# Patient Record
Sex: Female | Born: 1980 | Race: White | Hispanic: No | Marital: Single | Smoking: Current every day smoker
Health system: Southern US, Community
[De-identification: ages and names within clinical notes are randomized; demographics above are authoritative.]

## PROBLEM LIST (undated history)

## (undated) DIAGNOSIS — K5790 Diverticulosis of intestine, part unspecified, without perforation or abscess without bleeding: Secondary | ICD-10-CM

## (undated) DIAGNOSIS — R112 Nausea with vomiting, unspecified: Secondary | ICD-10-CM

## (undated) DIAGNOSIS — F17200 Nicotine dependence, unspecified, uncomplicated: Secondary | ICD-10-CM

## (undated) DIAGNOSIS — I639 Cerebral infarction, unspecified: Secondary | ICD-10-CM

## (undated) DIAGNOSIS — G43909 Migraine, unspecified, not intractable, without status migrainosus: Secondary | ICD-10-CM

## (undated) DIAGNOSIS — Z7902 Long term (current) use of antithrombotics/antiplatelets: Secondary | ICD-10-CM

## (undated) DIAGNOSIS — G9341 Metabolic encephalopathy: Secondary | ICD-10-CM

## (undated) DIAGNOSIS — I10 Essential (primary) hypertension: Secondary | ICD-10-CM

## (undated) DIAGNOSIS — E785 Hyperlipidemia, unspecified: Secondary | ICD-10-CM

## (undated) DIAGNOSIS — F418 Other specified anxiety disorders: Secondary | ICD-10-CM

## (undated) DIAGNOSIS — Z9889 Other specified postprocedural states: Secondary | ICD-10-CM

## (undated) DIAGNOSIS — Z7901 Long term (current) use of anticoagulants: Secondary | ICD-10-CM

## (undated) DIAGNOSIS — D75838 Other thrombocytosis: Secondary | ICD-10-CM

## (undated) DIAGNOSIS — N812 Incomplete uterovaginal prolapse: Secondary | ICD-10-CM

## (undated) DIAGNOSIS — N83201 Unspecified ovarian cyst, right side: Secondary | ICD-10-CM

## (undated) DIAGNOSIS — R296 Repeated falls: Secondary | ICD-10-CM

## (undated) DIAGNOSIS — F4325 Adjustment disorder with mixed disturbance of emotions and conduct: Secondary | ICD-10-CM

## (undated) DIAGNOSIS — I480 Paroxysmal atrial fibrillation: Secondary | ICD-10-CM

## (undated) DIAGNOSIS — N939 Abnormal uterine and vaginal bleeding, unspecified: Secondary | ICD-10-CM

## (undated) DIAGNOSIS — N200 Calculus of kidney: Secondary | ICD-10-CM

## (undated) DIAGNOSIS — D509 Iron deficiency anemia, unspecified: Secondary | ICD-10-CM

## (undated) DIAGNOSIS — F329 Major depressive disorder, single episode, unspecified: Secondary | ICD-10-CM

## (undated) DIAGNOSIS — R0609 Other forms of dyspnea: Secondary | ICD-10-CM

## (undated) DIAGNOSIS — F419 Anxiety disorder, unspecified: Secondary | ICD-10-CM

## (undated) HISTORY — DX: Cerebral infarction, unspecified: I63.9

## (undated) HISTORY — PX: CHOLECYSTECTOMY: SHX55

## (undated) HISTORY — PX: TUBAL LIGATION: SHX77

## (undated) HISTORY — DX: Essential (primary) hypertension: I10

---

## 1998-06-26 ENCOUNTER — Ambulatory Visit (HOSPITAL_COMMUNITY): Admission: RE | Admit: 1998-06-26 | Discharge: 1998-06-26 | Payer: Self-pay | Admitting: *Deleted

## 1998-07-28 ENCOUNTER — Ambulatory Visit (HOSPITAL_COMMUNITY): Admission: RE | Admit: 1998-07-28 | Discharge: 1998-07-28 | Payer: Self-pay | Admitting: Obstetrics & Gynecology

## 1998-11-11 ENCOUNTER — Inpatient Hospital Stay (HOSPITAL_COMMUNITY): Admission: AD | Admit: 1998-11-11 | Discharge: 1998-11-11 | Payer: Self-pay | Admitting: Obstetrics

## 1998-11-13 ENCOUNTER — Inpatient Hospital Stay (HOSPITAL_COMMUNITY): Admission: AD | Admit: 1998-11-13 | Discharge: 1998-11-13 | Payer: Self-pay | Admitting: Obstetrics & Gynecology

## 1998-11-13 ENCOUNTER — Encounter: Payer: Self-pay | Admitting: Obstetrics & Gynecology

## 1998-11-16 ENCOUNTER — Inpatient Hospital Stay (HOSPITAL_COMMUNITY): Admission: AD | Admit: 1998-11-16 | Discharge: 1998-11-17 | Payer: Self-pay | Admitting: Obstetrics

## 1999-02-03 ENCOUNTER — Emergency Department (HOSPITAL_COMMUNITY): Admission: EM | Admit: 1999-02-03 | Discharge: 1999-02-03 | Payer: Self-pay

## 1999-08-17 ENCOUNTER — Encounter: Payer: Self-pay | Admitting: Emergency Medicine

## 1999-08-17 ENCOUNTER — Emergency Department (HOSPITAL_COMMUNITY): Admission: EM | Admit: 1999-08-17 | Discharge: 1999-08-17 | Payer: Self-pay | Admitting: Emergency Medicine

## 2000-08-09 ENCOUNTER — Emergency Department (HOSPITAL_COMMUNITY): Admission: EM | Admit: 2000-08-09 | Discharge: 2000-08-09 | Payer: Self-pay | Admitting: Emergency Medicine

## 2000-08-09 ENCOUNTER — Encounter: Payer: Self-pay | Admitting: Emergency Medicine

## 2001-05-14 ENCOUNTER — Ambulatory Visit (HOSPITAL_COMMUNITY): Admission: RE | Admit: 2001-05-14 | Discharge: 2001-05-14 | Payer: Self-pay | Admitting: *Deleted

## 2001-08-29 ENCOUNTER — Ambulatory Visit (HOSPITAL_COMMUNITY): Admission: RE | Admit: 2001-08-29 | Discharge: 2001-08-29 | Payer: Self-pay | Admitting: *Deleted

## 2001-09-20 ENCOUNTER — Inpatient Hospital Stay (HOSPITAL_COMMUNITY): Admission: AD | Admit: 2001-09-20 | Discharge: 2001-09-20 | Payer: Self-pay | Admitting: *Deleted

## 2001-09-26 ENCOUNTER — Encounter (INDEPENDENT_AMBULATORY_CARE_PROVIDER_SITE_OTHER): Payer: Self-pay

## 2001-09-26 ENCOUNTER — Inpatient Hospital Stay (HOSPITAL_COMMUNITY): Admission: AD | Admit: 2001-09-26 | Discharge: 2001-09-27 | Payer: Self-pay | Admitting: *Deleted

## 2003-01-05 ENCOUNTER — Emergency Department (HOSPITAL_COMMUNITY): Admission: EM | Admit: 2003-01-05 | Discharge: 2003-01-05 | Payer: Self-pay | Admitting: Emergency Medicine

## 2003-10-24 ENCOUNTER — Emergency Department (HOSPITAL_COMMUNITY): Admission: EM | Admit: 2003-10-24 | Discharge: 2003-10-24 | Payer: Self-pay | Admitting: Emergency Medicine

## 2004-05-11 ENCOUNTER — Emergency Department (HOSPITAL_COMMUNITY): Admission: EM | Admit: 2004-05-11 | Discharge: 2004-05-12 | Payer: Self-pay | Admitting: Emergency Medicine

## 2004-05-22 ENCOUNTER — Emergency Department (HOSPITAL_COMMUNITY): Admission: EM | Admit: 2004-05-22 | Discharge: 2004-05-23 | Payer: Self-pay | Admitting: Emergency Medicine

## 2004-06-07 ENCOUNTER — Ambulatory Visit (HOSPITAL_COMMUNITY): Admission: RE | Admit: 2004-06-07 | Discharge: 2004-06-07 | Payer: Self-pay | Admitting: General Surgery

## 2004-06-07 ENCOUNTER — Encounter (INDEPENDENT_AMBULATORY_CARE_PROVIDER_SITE_OTHER): Payer: Self-pay | Admitting: *Deleted

## 2007-11-23 ENCOUNTER — Emergency Department (HOSPITAL_COMMUNITY): Admission: EM | Admit: 2007-11-23 | Discharge: 2007-11-23 | Payer: Self-pay | Admitting: Emergency Medicine

## 2010-08-06 NOTE — Op Note (Signed)
Crystal Benitez, Crystal Benitez              ACCOUNT NO.:  [id number]   MEDICAL RECORD NO.:  [id number]          PATIENT TYPE:  AMB   LOCATION:  DAY                          FACILITY:  [facility]   PHYSICIAN:  Angelia Mould. Derrell Lolling, M.D.DATE OF BIRTH:  30-Jul-1980   DATE OF PROCEDURE:  06/07/2004  DATE OF DISCHARGE:                                 OPERATIVE REPORT   PREOPERATIVE DIAGNOSIS:  Subacute and chronic cholecystitis with  cholelithiasis.   POSTOPERATIVE DIAGNOSIS:  Subacute and chronic cholecystitis with  cholelithiasis.   OPERATION PERFORMED:  Laparoscopic cholecystectomy with cholangiogram.   SURGEON:  Angelia Mould. Derrell Lolling, M.D.   FIRST ASSISTANT:  Vikki Ports, M.D.   OPERATIVE INDICATIONS:  This is a 30 year old Latino female, gravida 3, para  3, who has a several-week history of recurrent episodes of epigastric and  right upper quadrant pain. This has been getting worse lately.  She has gone  to the emergency department on February 21 and again on March 6.  On  February 21, she had a gallbladder ultrasound which showed gallstones but  was otherwise normal.  She has had laboratory work on both occasions which  was normal.  She was seen in the office this morning by Dr. Leonie Man  who felt that she possibly had acute cholecystitis and was in a lot of pain.  The patient was transferred to [facility] where I have interviewed  her, examined her, and counseled with her regarding cholecystectomy.  I  discussed the indications and details surgery with her. Risks and  complications were outlined, including but not limited to bleeding,  infection, conversion to open laparotomy, injury to adjacent structures such  as the main bile duct or vessels with major reconstructive surgery, wound  problems, cardiac, pulmonary and thromboembolic problems.  At this time, she  seems to understand all of these issues well.  All of her questions were  answered. She would like to go ahead  with cholecystectomy today because of  daily symptoms.   OPERATIVE FINDINGS:  The gallbladder appeared chronically inflamed, somewhat  discolored, but was basically thin-walled and did not look severely acutely  inflamed.  The anatomy of the cystic duct and cystic artery and common bile  duct were conventional.  The cholangiogram was normal showing normal caliber  bile ducts, normal intrahepatic and extrahepatic biliary anatomy, no filling  defects, and good flow of contrast into the duodenum. There was no other  abnormality noted of the liver, stomach, duodenum, small intestine, large  intestine, or peritoneal surfaces.   OPERATIVE TECHNIQUE:  Following the induction of general endotracheal  anesthesia, the patient's abdomen was prepped and draped in a sterile  fashion.  Then, 0.5% Marcaine with epinephrine was used s a local  infiltration anesthetic. A vertically oriented incision was made inside the  lower rim of the umbilicus. The fascia was incised in the midline and the  abdominal cavity entered under direct vision.  A 10-mm Hassan trocar was  inserted and secured with a pursestring suture of 0 Vicryl.  Pneumoperitoneum was created. The cannula was inserted with visualization  and findings  as described above.  A 10-mm trocar was placed in the  subxiphoid region and two 5-mm trocars placed in the right midabdomen.  The  gallbladder fundus was elevated.  The infundibulum was identified and  retracted laterally.  I dissected the peritoneum off the neck of the  gallbladder, and the cystic duct and the cystic artery were identified.  I  identified an anterior branch and a posterior branch of the cystic artery,  isolated these separately, secured them separately with metal clips, and  divided them.  This created a large window and a nice critical view of the  cystic duct.  The cystic duct was secured with a metal clip close to the  gallbladder.  A cholangiogram catheter was inserted into  the cystic duct.  Cholangiogram was obtained using the C-arm. This showed normal biliary  anatomy, no filling defect, and no obstruction.  The cholangiogram catheter  was removed.  The cystic duct was secured with multiple metal clips and  divided.  The gallbladder was dissected from its bed with electrocautery and  removed through the umbilical port.  The operative field was copiously  irrigated with saline both under and above the liver.  At the completion of  the case, the irrigation fluid was completely clear, and there was no  bleeding or bile leak whatsoever.  The trocars were removed under direct  vision.  There was no bleeding from the trocar sites. Pneumoperitoneum was  released.  The fascia at the umbilicus was closed with 0 Vicryl sutures.  The skin incisions were closed with subcuticular sutures of 4-0 Vicryl and  Steri-Strips.  Clean bandages were placed, and the patient taken to the  recovery room in stable condition.  Estimated blood loss was about 10 cc.  Complications:  None.  Sponge, needle, and instrument counts were correct.      HMI/MEDQ  D:  06/07/2004  T:  06/07/2004  Job:  [id number]

## 2010-08-06 NOTE — Op Note (Signed)
[facility]  Patient:    Crystal Benitez, Crystal Benitez Visit Number: [id number] MRN: [id number]          Service Type: OBS Location: 910A [id number] [id number] Attending Physician:  Michaelle Copas Dictated by:   Elinor Dodge, M.D. Proc. Date: 09/26/01 Admit Date:  09/26/2001 Discharge Date: 09/27/2001                             Operative Report  PROCEDURE:                    Bilateral tubal ligation postpartum,                               modified P[place]oy.  PREOPERATIVE DIAGNOSIS:       Voluntary sterilization.  POSTOPERATIVE DIAGNOSIS:      Voluntary sterilization.  DESCRIPTION OF PROCEDURE:     Under satisfactory epidural anesthesia, with the patient in the dorsal supine position, a 4 cm incision is made elliptical under the umbilicus.  The abdomen was entered by layers and into the peritoneal cavity.  The right tube was grasped in the mid point and followed down to the fimbria.  An opening made in the meso beneath the tube in the mid portion with a hemostat and one plain catgut suture pulled through.  This was tied around the distal and proximal portions of the tube, performing a loop above the tie of approximately 2 cm.  A second tie placed just below the aforementioned tie of the same material, this was cut short.  The portion of the tube above the ties were excised and the remaining ends of the tube at that point were coagulated with high cauteries.  The process was carried out then on the left fallopian tube.  The area was observed for bleeding; none was noted.  The peritoneum and fascia were closed with continuous running 0 Vicryl and an atraumatic needle up to the subcutaneous tissue.  This was also reapproximated with the same suture.  It was cut short and the skin edges approximated with Dermabond material.  A dry sterile dressing was applied.  The patient tolerated the procedure well and was transferred to the recovery room in satisfactory  condition with minimal blood loss. Dictated by:   Elinor Dodge, M.D. Attending Physician:  Michaelle Copas DD:  09/26/01 TD:  09/30/01 Job: [id number]

## 2011-04-28 ENCOUNTER — Emergency Department (HOSPITAL_COMMUNITY)
Admission: EM | Admit: 2011-04-28 | Discharge: 2011-04-28 | Disposition: A | Discharge status: Home / Self Care | Payer: Medicaid Other | Attending: Emergency Medicine | Admitting: Emergency Medicine

## 2011-04-28 ENCOUNTER — Encounter (HOSPITAL_COMMUNITY): Payer: Self-pay | Admitting: Emergency Medicine

## 2011-04-28 DIAGNOSIS — J069 Acute upper respiratory infection, unspecified: Secondary | ICD-10-CM

## 2011-04-28 DIAGNOSIS — R05 Cough: Secondary | ICD-10-CM | POA: Insufficient documentation

## 2011-04-28 DIAGNOSIS — R059 Cough, unspecified: Secondary | ICD-10-CM | POA: Insufficient documentation

## 2011-04-28 NOTE — ED Provider Notes (Signed)
Medical screening examination/treatment/procedure(s) were performed by non-physician practitioner and as supervising physician I was immediately available for consultation/collaboration.  Flint Melter, MD 04/28/11 [phone]

## 2011-04-28 NOTE — ED Provider Notes (Signed)
History     CSN: [id number]  Arrival date & time 04/28/11  0900   First MD Initiated Contact with Patient 04/28/11 0920      No chief complaint on file.   (Consider location/radiation/quality/duration/timing/severity/associated sxs/prior treatment) Patient is a 31 y.o. female presenting with pharyngitis. The history is provided by the patient.  Sore Throat This is a new problem. Episode onset: 2 days ago. The problem occurs constantly. The problem has been unchanged. Associated symptoms include chills, congestion, coughing and a sore throat. Pertinent negatives include no abdominal pain, chest pain, fever, headaches, myalgias, nausea, neck pain, numbness, rash, vertigo, visual change or vomiting. Associated symptoms comments: Positive bilateral ear pain. The symptoms are aggravated by swallowing. Treatments tried: bneadryl, OTC cold medication. The treatment provided mild relief.    No past medical history on file.  Past Surgical History  Procedure Date  . Cholecystectomy   . Tubal ligation     No family history on file.  History  Substance Use Topics  . Smoking status: Never Smoker   . Smokeless tobacco: Never Used  . Alcohol Use: No     Review of Systems  Constitutional: Positive for chills. Negative for fever.  HENT: Positive for ear pain, congestion, sore throat, rhinorrhea and sneezing. Negative for hearing loss, neck pain, neck stiffness, dental problem, voice change and tinnitus.   Eyes: Negative for pain and visual disturbance.  Respiratory: Positive for cough. Negative for chest tightness, shortness of breath and wheezing.   Cardiovascular: Negative for chest pain.  Gastrointestinal: Negative for nausea, vomiting and abdominal pain. Diarrhea: loose stools yesterday, none today.  Musculoskeletal: Negative for myalgias, back pain and gait problem.  Skin: Negative for rash.  Neurological: Negative for dizziness, vertigo, numbness and headaches.    Psychiatric/Behavioral: Negative for confusion.    Allergies  Review of patient's allergies indicates no known allergies.  Home Medications   Current Outpatient Rx  Name Route Sig Dispense Refill  . DIPHENHYDRAMINE HCL 25 MG PO CAPS Oral Take 25 mg by mouth every 6 (six) hours as needed. For sneezing    . NYQUIL COLD & FLU PO Oral Take 2 capsules by mouth every 6 (six) hours as needed. For cold symptoms      BP 141/80  Pulse 95  Temp(Src) 98.1 F (36.7 C) (Oral)  Resp 20  SpO2 100%  LMP 04/07/2011  Physical Exam  Nursing note and vitals reviewed. Constitutional: She is oriented to person, place, and time. She appears well-developed and well-nourished. No distress.  HENT:  Head: Normocephalic and atraumatic. No trismus in the jaw.  Right Ear: Hearing, external ear and ear canal normal.  Left Ear: Hearing, external ear and ear canal normal.  Nose: Rhinorrhea present. Right sinus exhibits no maxillary sinus tenderness and no frontal sinus tenderness. Left sinus exhibits no maxillary sinus tenderness and no frontal sinus tenderness.  Mouth/Throat: Uvula is midline and mucous membranes are normal. Posterior oropharyngeal erythema present. No oropharyngeal exudate or posterior oropharyngeal edema.       Mild bilateral TM injection  Eyes: Conjunctivae are normal. Pupils are equal, round, and reactive to light. Right eye exhibits no discharge. Left eye exhibits no discharge.  Neck: Normal range of motion. Neck supple.  Cardiovascular: Normal rate, regular rhythm and normal heart sounds.   No murmur heard. Pulmonary/Chest: Effort normal and breath sounds normal. No respiratory distress. She has no wheezes. She exhibits no tenderness.  Abdominal: Soft. She exhibits no distension. There is no tenderness.  Musculoskeletal:  She exhibits no edema and no tenderness.  Lymphadenopathy:    She has no cervical adenopathy.  Neurological: She is alert and oriented to person, place, and time. No  cranial nerve deficit.  Skin: Skin is warm and dry. No rash noted.  Psychiatric: She has a normal mood and affect.    ED Course  Procedures (including critical care time)    Dx 1: URI   MDM  Viral s/s. No myalgias or fever to suggest influenza. No SOB, hypoxia, abn breath sounds to suggest pneumonia. Discussed symptomatic tx with pt.        Shaaron Adler, [place] 04/28/11 1007

## 2011-04-28 NOTE — ED Notes (Signed)
Pt c/o of sore throat since x 2 days that hurts to swallow. Pt unsure if she has had a fever but has a cough and runny nose. Pt reports some diarrhea 4 episodes yesterday but denies n/v.

## 2012-09-27 ENCOUNTER — Emergency Department (HOSPITAL_COMMUNITY)
Admission: EM | Admit: 2012-09-27 | Discharge: 2012-09-27 | Disposition: A | Discharge status: Home / Self Care | Payer: BC Managed Care – PPO | Attending: Emergency Medicine | Admitting: Emergency Medicine

## 2012-09-27 ENCOUNTER — Encounter (HOSPITAL_COMMUNITY): Payer: Self-pay | Admitting: Emergency Medicine

## 2012-09-27 ENCOUNTER — Emergency Department (HOSPITAL_COMMUNITY): Payer: BC Managed Care – PPO

## 2012-09-27 DIAGNOSIS — R0602 Shortness of breath: Secondary | ICD-10-CM | POA: Insufficient documentation

## 2012-09-27 DIAGNOSIS — Z79899 Other long term (current) drug therapy: Secondary | ICD-10-CM | POA: Insufficient documentation

## 2012-09-27 DIAGNOSIS — J3489 Other specified disorders of nose and nasal sinuses: Secondary | ICD-10-CM | POA: Insufficient documentation

## 2012-09-27 DIAGNOSIS — J40 Bronchitis, not specified as acute or chronic: Secondary | ICD-10-CM | POA: Insufficient documentation

## 2012-09-27 MED ORDER — AZITHROMYCIN 250 MG PO TABS: ORAL_TABLET | ORAL | Status: DC

## 2012-09-27 MED ORDER — ALBUTEROL SULFATE (5 MG/ML) 0.5% IN NEBU
2.5000 mg | INHALATION_SOLUTION | Freq: Once | RESPIRATORY_TRACT | Status: AC
  Administered 2012-09-27: 2.5 mg via RESPIRATORY_TRACT
  Filled 2012-09-27: qty 0.5

## 2012-09-27 MED ORDER — ALBUTEROL SULFATE HFA 108 (90 BASE) MCG/ACT IN AERS: 2.0000 | INHALATION_SPRAY | RESPIRATORY_TRACT | Status: DC | PRN

## 2012-09-27 MED ORDER — BENZONATATE 100 MG PO CAPS: 100.0000 mg | ORAL_CAPSULE | Freq: Three times a day (TID) | ORAL | Status: DC

## 2012-09-27 NOTE — ED Notes (Signed)
Pt states she has been coughing x 1 week. Pt states she has some SOB and is coughing up mucous. Pt in NAD. VSS.

## 2012-09-27 NOTE — ED Provider Notes (Signed)
History    CSN: [id number] Arrival date & time 09/27/12  0620  First MD Initiated Contact with Patient 09/27/12 0701     Chief Complaint  Patient presents with  . Cough   (Consider location/radiation/quality/duration/timing/severity/associated sxs/prior Treatment) HPI Comments: Patient comes to the ER for evaluation of cough. Patient reports that she started having cough, nasal congestion approximately one week ago. Cough has been worse, now burning up mucus. She feels short of breath. She has been taking Claritin-D with some improvement. She has not had any fever.  Patient is a 32 y.o. female presenting with cough.  Cough Associated symptoms: shortness of breath   Associated symptoms: no fever    History reviewed. No pertinent past medical history. Past Surgical History  Procedure Laterality Date  . Cholecystectomy    . Tubal ligation     No family history on file. History  Substance Use Topics  . Smoking status: Never Smoker   . Smokeless tobacco: Never Used  . Alcohol Use: No   OB History   Grav Para Term Preterm Abortions TAB SAB Ect Mult Living                 Review of Systems  Constitutional: Negative for fever.  HENT: Positive for congestion.   Respiratory: Positive for cough and shortness of breath.   All other systems reviewed and are negative.    Allergies  Review of patient's allergies indicates no known allergies.  Home Medications   Current Outpatient Rx  Name  Route  Sig  Dispense  Refill  . dextromethorphan (DELSYM) 30 MG/5ML liquid   Oral   Take 60 mg by mouth 2 (two) times daily as needed for cough.         Marland Kitchen guaiFENesin (ROBITUSSIN) 100 MG/5ML SOLN   Oral   Take 5 mLs by mouth every 4 (four) hours as needed. For cough         . loratadine-pseudoephedrine (CLARITIN-D 24-HOUR) 10-240 MG per 24 hr tablet   Oral   Take 1 tablet by mouth daily.          BP 102/80  Pulse 89  Temp(Src) 97.4 F (36.3 C) (Oral)  Resp 16  SpO2 97%   LMP 09/07/2012 Physical Exam  Constitutional: She is oriented to person, place, and time. She appears well-developed and well-nourished. No distress.  HENT:  Head: Normocephalic and atraumatic.  Right Ear: Hearing normal.  Left Ear: Hearing normal.  Nose: Nose normal.  Mouth/Throat: Oropharynx is clear and moist and mucous membranes are normal.  Eyes: Conjunctivae and EOM are normal. Pupils are equal, round, and reactive to light.  Neck: Normal range of motion. Neck supple.  Cardiovascular: Regular rhythm, S1 normal and S2 normal.  Exam reveals no gallop and no friction rub.   No murmur heard. Pulmonary/Chest: Effort normal and breath sounds normal. No respiratory distress. She exhibits no tenderness.  Abdominal: Soft. Normal appearance and bowel sounds are normal. There is no hepatosplenomegaly. There is no tenderness. There is no rebound, no guarding, no tenderness at McBurney's point and negative Murphy's sign. No hernia.  Musculoskeletal: Normal range of motion.  Neurological: She is alert and oriented to person, place, and time. She has normal strength. No cranial nerve deficit or sensory deficit. Coordination normal. GCS eye subscore is 4. GCS verbal subscore is 5. GCS motor subscore is 6.  Skin: Skin is warm, dry and intact. No rash noted. No cyanosis.  Psychiatric: She has a normal mood and  affect. Her speech is normal and behavior is normal. Thought content normal.    ED Course  Procedures (including critical care time) Labs Reviewed - No data to display Dg Chest 2 View  09/27/2012   *RADIOLOGY REPORT*  Clinical Data: Cough and congestion. Shortness of breath.  Prior smoker.  CHEST - 2 VIEW  Comparison: 11/23/2007 peri  Findings: Minimal peribronchial thickening and slight increased lung markings appear chronic may be related to chronic bronchitis type changes.  No infiltrate, congestive heart failure or pneumothorax.  Heart size within normal limits.  Lateral view with possible  subsegmental atelectatic changes retrosternal region.  Stability and/or clearing can be confirmed on follow-up.  IMPRESSION: Minimal peribronchial thickening and slight increased lung markings appear chronic may be related to chronic bronchitis type changes. No segmental infiltrate.  Lateral view with possible subsegmental atelectatic changes retrosternal region.  Stability and/or clearing can be confirmed on follow-up.   Original Report Authenticated By: Lacy Duverney, M.D.    Diagnosis: Bronchitis  MDM  Patient presents to the ER for evaluation of one week of progressively worsening cough. Patient has not been improving with over-the-counter medications. Chest x-ray does not show any evidence of pneumonia. Presentation consistent with bronchitis. Breath sounds are slightly diminished bilaterally without active wheezing. Symptoms and aeration improved with bronchodilator.  Gilda Crease, MD 09/27/12 [phone]

## 2013-01-17 ENCOUNTER — Emergency Department (HOSPITAL_COMMUNITY): Payer: BC Managed Care – PPO

## 2013-01-17 ENCOUNTER — Encounter (HOSPITAL_COMMUNITY): Payer: Self-pay | Admitting: Emergency Medicine

## 2013-01-17 ENCOUNTER — Emergency Department (HOSPITAL_COMMUNITY)
Admission: EM | Admit: 2013-01-17 | Discharge: 2013-01-17 | Disposition: A | Discharge status: Home / Self Care | Payer: BC Managed Care – PPO | Attending: Emergency Medicine | Admitting: Emergency Medicine

## 2013-01-17 DIAGNOSIS — J159 Unspecified bacterial pneumonia: Secondary | ICD-10-CM | POA: Insufficient documentation

## 2013-01-17 DIAGNOSIS — Z79899 Other long term (current) drug therapy: Secondary | ICD-10-CM | POA: Insufficient documentation

## 2013-01-17 DIAGNOSIS — R52 Pain, unspecified: Secondary | ICD-10-CM | POA: Insufficient documentation

## 2013-01-17 DIAGNOSIS — R062 Wheezing: Secondary | ICD-10-CM | POA: Insufficient documentation

## 2013-01-17 DIAGNOSIS — J189 Pneumonia, unspecified organism: Secondary | ICD-10-CM

## 2013-01-17 MED ORDER — ALBUTEROL SULFATE HFA 108 (90 BASE) MCG/ACT IN AERS
2.0000 | INHALATION_SPRAY | Freq: Four times a day (QID) | RESPIRATORY_TRACT | Status: DC
  Administered 2013-01-17: 2 via RESPIRATORY_TRACT
  Filled 2013-01-17: qty 6.7

## 2013-01-17 MED ORDER — IPRATROPIUM BROMIDE 0.02 % IN SOLN
0.5000 mg | Freq: Once | RESPIRATORY_TRACT | Status: AC
  Administered 2013-01-17: 0.5 mg via RESPIRATORY_TRACT
  Filled 2013-01-17: qty 2.5

## 2013-01-17 MED ORDER — ALBUTEROL SULFATE (5 MG/ML) 0.5% IN NEBU
5.0000 mg | INHALATION_SOLUTION | Freq: Once | RESPIRATORY_TRACT | Status: AC
  Administered 2013-01-17: 5 mg via RESPIRATORY_TRACT
  Filled 2013-01-17: qty 1

## 2013-01-17 MED ORDER — AZITHROMYCIN 250 MG PO TABS: 250.0000 mg | ORAL_TABLET | Freq: Every day | ORAL | Status: DC

## 2013-01-17 NOTE — ED Notes (Signed)
Pt states since Saturday has had cough, congestion w/ green mucus, body aches, chills and nausea.

## 2013-01-17 NOTE — ED Provider Notes (Signed)
CSN: [id number]     Arrival date & time 01/17/13  4098 History   First MD Initiated Contact with Patient 01/17/13 [phone]     Chief Complaint  Patient presents with  . Cough  . Chills  . Generalized Body Aches  . Nasal Congestion   (Consider location/radiation/quality/duration/timing/severity/associated sxs/prior Treatment) HPI Comments: Patient presents to the emergency department with chief complaint of cough, sore throat, and chest congestion. She states that she has been sick since Saturday. She states that her other family members have been diagnosed with pneumonia. She endorses productive green sputum. She also states that she has had subjective fevers and chills, but has not taken her temperature. She is a former smoker, and reportedly quit 3 months ago. Nothing makes her symptoms better or worse. No other health problems.  The history is provided by the patient. No language interpreter was used.    History reviewed. No pertinent past medical history. Past Surgical History  Procedure Laterality Date  . Cholecystectomy    . Tubal ligation     No family history on file. History  Substance Use Topics  . Smoking status: Never Smoker   . Smokeless tobacco: Never Used  . Alcohol Use: No   OB History   Grav Para Term Preterm Abortions TAB SAB Ect Mult Living                 Review of Systems  All other systems reviewed and are negative.    Allergies  Review of patient's allergies indicates no known allergies.  Home Medications   Current Outpatient Rx  Name  Route  Sig  Dispense  Refill  . albuterol (PROVENTIL HFA;VENTOLIN HFA) 108 (90 BASE) MCG/ACT inhaler   Inhalation   Inhale 2 puffs into the lungs every 4 (four) hours as needed for wheezing.   1 Inhaler   0   . benzonatate (TESSALON) 100 MG capsule   Oral   Take 1 capsule (100 mg total) by mouth every 8 (eight) hours.   21 capsule   0   . dextromethorphan (DELSYM) 30 MG/5ML liquid   Oral   Take 60 mg by  mouth 2 (two) times daily as needed for cough.         Marland Kitchen guaiFENesin (ROBITUSSIN) 100 MG/5ML SOLN   Oral   Take 5 mLs by mouth every 4 (four) hours as needed. For cough         . loratadine-pseudoephedrine (CLARITIN-D 24-HOUR) 10-240 MG per 24 hr tablet   Oral   Take 1 tablet by mouth daily.          BP 137/86  Pulse 89  Temp(Src) 98.1 F (36.7 C) (Oral)  Resp 20  SpO2 96%  LMP 01/17/2013 Physical Exam  Nursing note and vitals reviewed. Constitutional: She is oriented to person, place, and time. She appears well-developed and well-nourished.  HENT:  Head: Normocephalic and atraumatic.  Eyes: Conjunctivae and EOM are normal. Pupils are equal, round, and reactive to light.  Neck: Normal range of motion. Neck supple.  Cardiovascular: Normal rate and regular rhythm.  Exam reveals no gallop and no friction rub.   No murmur heard. Pulmonary/Chest: Effort normal. No respiratory distress. She has wheezes. She has no rales. She exhibits no tenderness.  Lower lobe wheezes  Abdominal: Soft. Bowel sounds are normal. She exhibits no distension and no mass. There is no tenderness. There is no rebound and no guarding.  Musculoskeletal: Normal range of motion. She exhibits no edema and  no tenderness.  Neurological: She is alert and oriented to person, place, and time.  Skin: Skin is warm and dry.  Psychiatric: She has a normal mood and affect. Her behavior is normal. Judgment and thought content normal.    ED Course  Procedures (including critical care time) No results found for this or any previous visit. Dg Chest 2 View  01/17/2013   CLINICAL DATA:  Chest pain, cough and congestion.  EXAM: CHEST  2 VIEW  COMPARISON:  09/27/2012.  FINDINGS: The cardiac silhouette, mediastinal and hilar contours are within normal limits and stable. There is peribronchial thickening and increased interstitial markings consistent with bronchitis. There is also ill-defined opacity in the lingular region  consistent with pneumonia.  IMPRESSION: Bronchitis and left lingular pneumonia.   Electronically Signed   By: Loralie Champagne M.D.   On: 01/17/2013 09:50      EKG Interpretation   None       MDM   1. Community acquired pneumonia     Patient with productive cough and chest congestion. Wheezes heard on exam. Will give nebulizer and chest x-ray. Will reevaluate.    CXR reveals pneumonia.  O2 sat is 97.  No in any apparent distress.  Discharge home with inhaler and azithromycin.    Roxy Horseman, PA-C 01/17/13 1023

## 2013-01-17 NOTE — ED Notes (Signed)
RT called

## 2013-01-17 NOTE — ED Provider Notes (Signed)
Medical screening examination/treatment/procedure(s) were performed by non-physician practitioner and as supervising physician I was immediately available for consultation/collaboration.  EKG Interpretation   None         Gwyneth Sprout, MD 01/17/13 1516

## 2013-01-17 NOTE — ED Notes (Addendum)
Pt, states husband and daughter both have pneumonia

## 2014-02-17 ENCOUNTER — Emergency Department (HOSPITAL_COMMUNITY)
Admission: EM | Admit: 2014-02-17 | Discharge: 2014-02-17 | Disposition: A | Discharge status: Home / Self Care | Payer: 59 | Attending: Emergency Medicine | Admitting: Emergency Medicine

## 2014-02-17 ENCOUNTER — Encounter (HOSPITAL_COMMUNITY): Payer: Self-pay | Admitting: Emergency Medicine

## 2014-02-17 ENCOUNTER — Emergency Department (HOSPITAL_COMMUNITY): Payer: 59

## 2014-02-17 DIAGNOSIS — Z9049 Acquired absence of other specified parts of digestive tract: Secondary | ICD-10-CM | POA: Diagnosis not present

## 2014-02-17 DIAGNOSIS — Z9851 Tubal ligation status: Secondary | ICD-10-CM | POA: Diagnosis not present

## 2014-02-17 DIAGNOSIS — R1013 Epigastric pain: Secondary | ICD-10-CM | POA: Insufficient documentation

## 2014-02-17 DIAGNOSIS — R109 Unspecified abdominal pain: Secondary | ICD-10-CM | POA: Diagnosis present

## 2014-02-17 DIAGNOSIS — R110 Nausea: Secondary | ICD-10-CM

## 2014-02-17 DIAGNOSIS — R10816 Epigastric abdominal tenderness: Secondary | ICD-10-CM

## 2014-02-17 DIAGNOSIS — Z3202 Encounter for pregnancy test, result negative: Secondary | ICD-10-CM | POA: Insufficient documentation

## 2014-02-17 DIAGNOSIS — Z87442 Personal history of urinary calculi: Secondary | ICD-10-CM | POA: Diagnosis not present

## 2014-02-17 DIAGNOSIS — Z79899 Other long term (current) drug therapy: Secondary | ICD-10-CM | POA: Diagnosis not present

## 2014-02-17 HISTORY — DX: Calculus of kidney: N20.0

## 2014-02-17 LAB — CBC WITH DIFFERENTIAL/PLATELET
Basophils Absolute: 0 K/uL (ref 0.0–0.1)
Basophils Relative: 0 % (ref 0–1)
Eosinophils Absolute: 0.1 K/uL (ref 0.0–0.7)
Eosinophils Relative: 1 % (ref 0–5)
HCT: 40.6 % (ref 36.0–46.0)
Hemoglobin: 13.8 g/dL (ref 12.0–15.0)
Lymphocytes Relative: 34 % (ref 12–46)
Lymphs Abs: 2.5 K/uL (ref 0.7–4.0)
MCH: 29.9 pg (ref 26.0–34.0)
MCHC: 34 g/dL (ref 30.0–36.0)
MCV: 88.1 fL (ref 78.0–100.0)
Monocytes Absolute: 0.4 K/uL (ref 0.1–1.0)
Monocytes Relative: 5 % (ref 3–12)
Neutro Abs: 4.4 K/uL (ref 1.7–7.7)
Neutrophils Relative %: 60 % (ref 43–77)
Platelets: ADEQUATE K/uL (ref 150–400)
RBC: 4.61 MIL/uL (ref 3.87–5.11)
RDW: 13.8 % (ref 11.5–15.5)
WBC: 7.4 K/uL (ref 4.0–10.5)

## 2014-02-17 LAB — URINALYSIS, ROUTINE W REFLEX MICROSCOPIC
Bilirubin Urine: NEGATIVE
Glucose, UA: NEGATIVE mg/dL
Ketones, ur: NEGATIVE mg/dL
Leukocytes, UA: NEGATIVE
Nitrite: NEGATIVE
Protein, ur: NEGATIVE mg/dL
Specific Gravity, Urine: 1.005 (ref 1.005–1.030)
Urobilinogen, UA: 0.2 mg/dL (ref 0.0–1.0)
pH: 5.5 (ref 5.0–8.0)

## 2014-02-17 LAB — COMPREHENSIVE METABOLIC PANEL
ALBUMIN: 4 g/dL (ref 3.5–5.2)
ALK PHOS: 66 U/L (ref 39–117)
ALT: 27 U/L (ref 0–35)
ANION GAP: 16 — AB (ref 5–15)
AST: 30 U/L (ref 0–37)
BUN: 13 mg/dL (ref 6–23)
CHLORIDE: 100 meq/L (ref 96–112)
CO2: 18 meq/L — AB (ref 19–32)
CREATININE: 0.59 mg/dL (ref 0.50–1.10)
Calcium: 9.9 mg/dL (ref 8.4–10.5)
GFR calc Af Amer: >90 mL/min (ref >90)
GFR calc non Af Amer: >90 mL/min (ref >90)
Glucose, Bld: 95 mg/dL (ref 70–99)
Potassium: 4.9 mEq/L (ref 3.7–5.3)
SODIUM: 134 meq/L — AB (ref 137–147)
Total Bilirubin: 0.3 mg/dL (ref 0.3–1.2)
Total Protein: 8.1 g/dL (ref 6.0–8.3)

## 2014-02-17 LAB — POC URINE PREG, ED: Preg Test, Ur: NEGATIVE

## 2014-02-17 LAB — URINE MICROSCOPIC-ADD ON

## 2014-02-17 MED ORDER — MORPHINE SULFATE 4 MG/ML IJ SOLN
4.0000 mg | Freq: Once | INTRAMUSCULAR | Status: AC
  Administered 2014-02-17: 4 mg via INTRAVENOUS
  Filled 2014-02-17: qty 1

## 2014-02-17 MED ORDER — ONDANSETRON HCL 4 MG/2ML IJ SOLN
4.0000 mg | Freq: Once | INTRAMUSCULAR | Status: AC
  Administered 2014-02-17: 4 mg via INTRAVENOUS
  Filled 2014-02-17: qty 2

## 2014-02-17 MED ORDER — ONDANSETRON HCL 4 MG PO TABS: 4.0000 mg | ORAL_TABLET | Freq: Four times a day (QID) | ORAL | Status: DC

## 2014-02-17 MED ORDER — GI COCKTAIL ~~LOC~~
30.0000 mL | Freq: Once | ORAL | Status: AC
  Administered 2014-02-17: 30 mL via ORAL
  Filled 2014-02-17: qty 30

## 2014-02-17 NOTE — ED Provider Notes (Signed)
CSN: [id number]     Arrival date & time 02/17/14  [id number] History   First MD Initiated Contact with Patient 02/17/14 [phone]     Chief Complaint  Patient presents with  . Flank Pain     (Consider location/radiation/quality/duration/timing/severity/associated sxs/prior Treatment) HPI  Crystal Benitez is a 33 y.o. female with PMH of nephrolithiasis in 1995 presenting with right flank pain for 3 days that is intermittent described as a pressure crampy pain. She denies radiation. Patient endorses mild nausea without emesis. Patient denies abdominal pain. Patient denies urinary symptoms or hematuria. Last BM yesterday and normal nonbloody. Abdominal surgeries include cholecystectomy in 2006 and tubal ligation. Patient denies vaginal complaints. Patient denies other medical history. No fevers, chills. No fevers, chills, night sweats, weight loss, IVDU, history of malignancy. No loss of control of bladder or bowel. No numbness/tingling, weakness or saddle anesthesia.    Past Medical History  Diagnosis Date  . Kidney stone    Past Surgical History  Procedure Laterality Date  . Cholecystectomy    . Tubal ligation     History reviewed. No pertinent family history. History  Substance Use Topics  . Smoking status: Never Smoker   . Smokeless tobacco: Never Used  . Alcohol Use: No   OB History    No data available     Review of Systems  Constitutional: Negative for fever and chills.  HENT: Negative for congestion and rhinorrhea.   Eyes: Negative for visual disturbance.  Respiratory: Negative for cough and shortness of breath.   Cardiovascular: Negative for chest pain.  Gastrointestinal: Positive for nausea. Negative for vomiting and diarrhea.  Genitourinary: Positive for flank pain. Negative for dysuria and hematuria.  Musculoskeletal: Negative for gait problem.  Skin: Negative for rash.  Neurological: Negative for weakness and headaches.      Allergies  Review of patient's allergies  indicates no known allergies.  Home Medications   Prior to Admission medications   Medication Sig Start Date End Date Taking? Authorizing Provider  albuterol (PROVENTIL HFA;VENTOLIN HFA) 108 (90 BASE) MCG/ACT inhaler Inhale 2 puffs into the lungs every 4 (four) hours as needed for wheezing. 09/27/12  Yes Gilda Creasehristopher J. Pollina, MD  ibuprofen (ADVIL,MOTRIN) 200 MG tablet Take 400 mg by mouth every 6 (six) hours as needed for mild pain or moderate pain.   Yes Historical Provider, MD  azithromycin (ZITHROMAX Z-PAK) 250 MG tablet Take 1 tablet (250 mg total) by mouth daily. 500mg  PO day 1, then 250mg  PO days 205 Patient not taking: Reported on 02/17/2014 01/17/13   Roxy Horsemanobert Browning, PA-C  ondansetron (ZOFRAN) 4 MG tablet Take 1 tablet (4 mg total) by mouth every 6 (six) hours. 02/17/14   Benetta SparVictoria L Anisten Tomassi, PA-C   BP 131/77 mmHg  Pulse 94  Temp(Src) 97.9 F (36.6 C) (Oral)  Resp 18  SpO2 95%  LMP  Physical Exam  Constitutional: She appears well-developed and well-nourished. No distress.  HENT:  Head: Normocephalic and atraumatic.  Eyes: Conjunctivae are normal. Right eye exhibits no discharge. Left eye exhibits no discharge.  Cardiovascular: Normal rate, regular rhythm and normal heart sounds.   Pulmonary/Chest: Effort normal and breath sounds normal. No respiratory distress. She has no wheezes.  Abdominal: Soft. Bowel sounds are normal. She exhibits no distension. There is no tenderness.  Musculoskeletal:  No midline back tenderness, step off or crepitus. Right sided lower back tenderness. Right CVA tenderness.   Neurological: She is alert. Coordination normal.  Equal muscle tone. 5/5 strength in lower extremities. DTR  equal and intact. Negative straight leg test. Normal gait.   Skin: Skin is warm and dry. She is not diaphoretic.  Nursing note and vitals reviewed.   ED Course  Procedures (including critical care time) Labs Review Labs Reviewed  URINALYSIS, ROUTINE W REFLEX  MICROSCOPIC - Abnormal; Notable for the following:    Hgb urine dipstick LARGE (*)    All other components within normal limits  COMPREHENSIVE METABOLIC PANEL - Abnormal; Notable for the following:    Sodium 134 (*)    CO2 18 (*)    Anion gap 16 (*)    All other components within normal limits  CBC WITH DIFFERENTIAL  URINE MICROSCOPIC-ADD ON  POC URINE PREG, ED    Imaging Review Ct Renal Stone Study  02/17/2014   CLINICAL DATA:  Three a history of flank pain  EXAM: CT ABDOMEN AND PELVIS WITHOUT CONTRAST  TECHNIQUE: Multidetector CT imaging of the abdomen and pelvis was performed following the standard protocol without oral or intravenous contrast material administration.  COMPARISON:  None.  FINDINGS: Lung bases are clear.  Liver is prominent measuring 21.5 cm in length. No focal liver lesions are identified on this noncontrast enhanced study. Gallbladder is absent. There is no biliary duct dilatation.  Spleen, pancreas, and adrenals appear normal.  Kidneys bilaterally show no appreciable mass or hydronephrosis on either side. There is no renal or ureteral calculus on either side. Several phleboliths are near but separate from the ureters distally on both sides.  In the pelvis, the urinary bladder is midline with wall thickness within normal limits. There is no pelvic mass or fluid. There are scattered sigmoid diverticula without diverticulitis. The appendix appears normal. Terminal ileum appears normal.  There is a small ventral hernia containing only fat.  There is no bowel obstruction. No free air or portal venous air. There is no appreciable ascites, adenopathy, or abscess in the abdomen or pelvis. There is no demonstrable abdominal aortic aneurysm. There are no blastic or lytic bone lesions.  IMPRESSION: No renal or ureteral calculus.  No hydronephrosis.  Appendix appears normal.  No bowel obstruction.  No abscess.  Prominent liver without focal lesion. Gallbladder absent. Small ventral hernia  containing only fat. Scattered sigmoid diverticula without diverticulitis. 11   Electronically Signed   By: Bretta BangWilliam  Woodruff M.D.   On: 02/17/2014 09:17     EKG Interpretation None      MDM   Final diagnoses:  Right flank pain  Nausea  Epigastric abdominal tenderness   Pt with history stones presenting with right flank pain, nausea and emesis. Normal WBC. UA without signs of infection. CT abdomen without stones or hydronephrosis. appendix normal and no acute findings to explain pain. On reevaluation pt with non surgical abdomen, no signs of peritonitis. Pt states pain has migrated to her epigastric region and is burning in nature. Pt given GI cocktail and her pain is 1/10. Will discharge home with plan for OTC omeprazole daily for 2 weeks. Patient without a PCP. Patient to establish care and follow up. ED resources provided. Pt appears reliable for follow up and is agreeable to discharge.   Discussed return precautions with patient. Discussed all results and patient verbalizes understanding and agrees with plan.  Case has been discussed with Dr. Gwendolyn GrantWalden who agrees with the above plan and to discharge.       Louann SjogrenVictoria L Tynika Luddy, PA-C 02/17/14 2003  Elwin MochaBlair Walden, MD 02/18/14 [phone]

## 2014-02-17 NOTE — ED Notes (Addendum)
PA at bedside.

## 2014-02-17 NOTE — ED Notes (Addendum)
Pt began to have R flank pain Friday. Hx of kidney stones, feels like same. No n/v

## 2014-02-17 NOTE — ED Notes (Signed)
PA at bedside.

## 2014-02-17 NOTE — Discharge Instructions (Signed)
Return to the emergency room with worsening of symptoms, new symptoms or with symptoms that are concerning, especially severe pain or worsening, unable to tolerate fluids, blood in vomit or stools.  Follow up with wellness center to establish care. Call to make appointment as soon as possible. OTC omeprazole 20mg  daily for 2 weeks.   Abdominal Pain, Women Abdominal (stomach, pelvic, or belly) pain can be caused by many things. It is important to tell your doctor:  The location of the pain.  Does it come and go or is it present all the time?  Are there things that start the pain (eating certain foods, exercise)?  Are there other symptoms associated with the pain (fever, nausea, vomiting, diarrhea)? All of this is helpful to know when trying to find the cause of the pain. CAUSES   Stomach: virus or bacteria infection, or ulcer.  Intestine: appendicitis (inflamed appendix), regional ileitis (Crohn's disease), ulcerative colitis (inflamed colon), irritable bowel syndrome, diverticulitis (inflamed diverticulum of the colon), or cancer of the stomach or intestine.  Gallbladder disease or stones in the gallbladder.  Kidney disease, kidney stones, or infection.  Pancreas infection or cancer.  Fibromyalgia (pain disorder).  Diseases of the female organs:  Uterus: fibroid (non-cancerous) tumors or infection.  Fallopian tubes: infection or tubal pregnancy.  Ovary: cysts or tumors.  Pelvic adhesions (scar tissue).  Endometriosis (uterus lining tissue growing in the pelvis and on the pelvic organs).  Pelvic congestion syndrome (female organs filling up with blood just before the menstrual period).  Pain with the menstrual period.  Pain with ovulation (producing an egg).  Pain with an IUD (intrauterine device, birth control) in the uterus.  Cancer of the female organs.  Functional pain (pain not caused by a disease, may improve without treatment).  Psychological  pain.  Depression. DIAGNOSIS  Your doctor will decide the seriousness of your pain by doing an examination.  Blood tests.  X-rays.  Ultrasound.  CT scan (computed tomography, special type of X-ray).  MRI (magnetic resonance imaging).  Cultures, for infection.  Barium enema (dye inserted in the large intestine, to better view it with X-rays).  Colonoscopy (looking in intestine with a lighted tube).  Laparoscopy (minor surgery, looking in abdomen with a lighted tube).  Major abdominal exploratory surgery (looking in abdomen with a large incision). TREATMENT  The treatment will depend on the cause of the pain.   Many cases can be observed and treated at home.  Over-the-counter medicines recommended by your caregiver.  Prescription medicine.  Antibiotics, for infection.  Birth control pills, for painful periods or for ovulation pain.  Hormone treatment, for endometriosis.  Nerve blocking injections.  Physical therapy.  Antidepressants.  Counseling with a psychologist or psychiatrist.  Minor or major surgery. HOME CARE INSTRUCTIONS   Do not take laxatives, unless directed by your caregiver.  Take over-the-counter pain medicine only if ordered by your caregiver. Do not take aspirin because it can cause an upset stomach or bleeding.  Try a clear liquid diet (broth or water) as ordered by your caregiver. Slowly move to a bland diet, as tolerated, if the pain is related to the stomach or intestine.  Have a thermometer and take your temperature several times a day, and record it.  Bed rest and sleep, if it helps the pain.  Avoid sexual intercourse, if it causes pain.  Avoid stressful situations.  Keep your follow-up appointments and tests, as your caregiver orders.  If the pain does not go away with medicine  or surgery, you may try:  Acupuncture.  Relaxation exercises (yoga, meditation).  Group therapy.  Counseling. SEEK MEDICAL CARE IF:   You  notice certain foods cause stomach pain.  Your home care treatment is not helping your pain.  You need stronger pain medicine.  You want your IUD removed.  You feel faint or lightheaded.  You develop nausea and vomiting.  You develop a rash.  You are having side effects or an allergy to your medicine. SEEK IMMEDIATE MEDICAL CARE IF:   Your pain does not go away or gets worse.  You have a fever.  Your pain is felt only in portions of the abdomen. The right side could possibly be appendicitis. The left lower portion of the abdomen could be colitis or diverticulitis.  You are passing blood in your stools (bright red or black tarry stools, with or without vomiting).  You have blood in your urine.  You develop chills, with or without a fever.  You pass out. MAKE SURE YOU:   Understand these instructions.  Will watch your condition.  Will get help right away if you are not doing well or get worse. Document Released: 01/02/2007 Document Revised: 07/22/2013 Document Reviewed: 01/22/2009 [facility] Patient Information 2015 [employer] [place], [place]. This information is not intended to replace advice given to you by your health care provider. Make sure you discuss any questions you have with your health care provider.

## 2015-03-30 ENCOUNTER — Encounter (HOSPITAL_COMMUNITY): Payer: Self-pay | Admitting: Emergency Medicine

## 2015-03-30 ENCOUNTER — Emergency Department (HOSPITAL_COMMUNITY)
Admission: EM | Admit: 2015-03-30 | Discharge: 2015-03-30 | Disposition: A | Discharge status: Home / Self Care | Payer: BLUE CROSS/BLUE SHIELD | Attending: Emergency Medicine | Admitting: Emergency Medicine

## 2015-03-30 DIAGNOSIS — S060X0A Concussion without loss of consciousness, initial encounter: Secondary | ICD-10-CM | POA: Diagnosis not present

## 2015-03-30 DIAGNOSIS — Z87442 Personal history of urinary calculi: Secondary | ICD-10-CM | POA: Insufficient documentation

## 2015-03-30 DIAGNOSIS — Y9289 Other specified places as the place of occurrence of the external cause: Secondary | ICD-10-CM | POA: Diagnosis not present

## 2015-03-30 DIAGNOSIS — Y998 Other external cause status: Secondary | ICD-10-CM | POA: Insufficient documentation

## 2015-03-30 DIAGNOSIS — W01198A Fall on same level from slipping, tripping and stumbling with subsequent striking against other object, initial encounter: Secondary | ICD-10-CM | POA: Insufficient documentation

## 2015-03-30 DIAGNOSIS — Y9389 Activity, other specified: Secondary | ICD-10-CM | POA: Insufficient documentation

## 2015-03-30 DIAGNOSIS — Z79899 Other long term (current) drug therapy: Secondary | ICD-10-CM | POA: Diagnosis not present

## 2015-03-30 DIAGNOSIS — S01311A Laceration without foreign body of right ear, initial encounter: Secondary | ICD-10-CM | POA: Insufficient documentation

## 2015-03-30 DIAGNOSIS — IMO0002 Reserved for concepts with insufficient information to code with codable children: Secondary | ICD-10-CM

## 2015-03-30 DIAGNOSIS — W19XXXA Unspecified fall, initial encounter: Secondary | ICD-10-CM

## 2015-03-30 DIAGNOSIS — Z23 Encounter for immunization: Secondary | ICD-10-CM | POA: Insufficient documentation

## 2015-03-30 MED ORDER — BACITRACIN ZINC 500 UNIT/GM EX OINT
1.0000 "application " | TOPICAL_OINTMENT | Freq: Two times a day (BID) | CUTANEOUS | Status: DC
  Administered 2015-03-30: 1 via TOPICAL

## 2015-03-30 MED ORDER — TETANUS-DIPHTH-ACELL PERTUSSIS 5-2.5-18.5 LF-MCG/0.5 IM SUSP
0.5000 mL | Freq: Once | INTRAMUSCULAR | Status: AC
  Administered 2015-03-30: 0.5 mL via INTRAMUSCULAR
  Filled 2015-03-30: qty 0.5

## 2015-03-30 NOTE — ED Notes (Signed)
Patient presents for unwitnessed fall. Reports slipping on ice, hit head, unsure of LOC, denies anticoagulants. Patient c/o right head pain behind ear. 1" laceration noted behind right ear, bleeding controlled. Rates pain 7/10.

## 2015-03-30 NOTE — Discharge Instructions (Signed)
Concussion, Adult A concussion, or closed-head injury, is a brain injury caused by a direct blow to the head or by a quick and sudden movement (jolt) of the head or neck. Concussions are usually not life-threatening. Even so, the effects of a concussion can be serious. If you have had a concussion before, you are more likely to experience concussion-like symptoms after a direct blow to the head.  CAUSES  Direct blow to the head, such as from running into another player during a soccer game, being hit in a fight, or hitting your head on a hard surface.  A jolt of the head or neck that causes the brain to move back and forth inside the skull, such as in a car crash. SIGNS AND SYMPTOMS The signs of a concussion can be hard to notice. Early on, they may be missed by you, family members, and health care providers. You may look fine but act or feel differently. Symptoms are usually temporary, but they may last for days, weeks, or even longer. Some symptoms may appear right away while others may not show up for hours or days. Every head injury is different. Symptoms include:  Mild to moderate headaches that will not go away.  A feeling of pressure inside your head.  Having more trouble than usual:  Learning or remembering things you have heard.  Answering questions.  Paying attention or concentrating.  Organizing daily tasks.  Making decisions and solving problems.  Slowness in thinking, acting or reacting, speaking, or reading.  Getting lost or being easily confused.  Feeling tired all the time or lacking energy (fatigued).  Feeling drowsy.  Sleep disturbances.  Sleeping more than usual.  Sleeping less than usual.  Trouble falling asleep.  Trouble sleeping (insomnia).  Loss of balance or feeling lightheaded or dizzy.  Nausea or vomiting.  Numbness or tingling.  Increased sensitivity to:  Sounds.  Lights.  Distractions.  Vision problems or eyes that tire  easily.  Diminished sense of taste or smell.  Ringing in the ears.  Mood changes such as feeling sad or anxious.  Becoming easily irritated or angry for little or no reason.  Lack of motivation.  Seeing or hearing things other people do not see or hear (hallucinations). DIAGNOSIS Your health care provider can usually diagnose a concussion based on a description of your injury and symptoms. He or she will ask whether you passed out (lost consciousness) and whether you are having trouble remembering events that happened right before and during your injury. Your evaluation might include:  A brain scan to look for signs of injury to the brain. Even if the test shows no injury, you may still have a concussion.  Blood tests to be sure other problems are not present. TREATMENT  Concussions are usually treated in an emergency department, in urgent care, or at a clinic. You may need to stay in the hospital overnight for further treatment.  Tell your health care provider if you are taking any medicines, including prescription medicines, over-the-counter medicines, and natural remedies. Some medicines, such as blood thinners (anticoagulants) and aspirin, may increase the chance of complications. Also tell your health care provider whether you have had alcohol or are taking illegal drugs. This information may affect treatment.  Your health care provider will send you home with important instructions to follow.  How fast you will recover from a concussion depends on many factors. These factors include how severe your concussion is, what part of your brain was injured,   your age, and how healthy you were before the concussion.  Most people with mild injuries recover fully. Recovery can take time. In general, recovery is slower in older persons. Also, persons who have had a concussion in the past or have other medical problems may find that it takes longer to recover from their current injury. HOME  CARE INSTRUCTIONS General Instructions  Carefully follow the directions your health care provider gave you.  Only take over-the-counter or prescription medicines for pain, discomfort, or fever as directed by your health care provider.  Take only those medicines that your health care provider has approved.  Do not drink alcohol until your health care provider says you are well enough to do so. Alcohol and certain other drugs may slow your recovery and can put you at risk of further injury.  If it is harder than usual to remember things, write them down.  If you are easily distracted, try to do one thing at a time. For example, do not try to watch TV while fixing dinner.  Talk with family members or close friends when making important decisions.  Keep all follow-up appointments. Repeated evaluation of your symptoms is recommended for your recovery.  Watch your symptoms and tell others to do the same. Complications sometimes occur after a concussion. Older adults with a brain injury may have a higher risk of serious complications, such as a blood clot on the brain.  Tell your teachers, school nurse, school counselor, coach, athletic trainer, or work manager about your injury, symptoms, and restrictions. Tell them about what you can or cannot do. They should watch for:  Increased problems with attention or concentration.  Increased difficulty remembering or learning new information.  Increased time needed to complete tasks or assignments.  Increased irritability or decreased ability to cope with stress.  Increased symptoms.  Rest. Rest helps the brain to heal. Make sure you:  Get plenty of sleep at night. Avoid staying up late at night.  Keep the same bedtime hours on weekends and weekdays.  Rest during the day. Take daytime naps or rest breaks when you feel tired.  Limit activities that require a lot of thought or concentration. These include:  Doing homework or job-related  work.  Watching TV.  Working on the computer.  Avoid any situation where there is potential for another head injury (football, hockey, soccer, basketball, martial arts, downhill snow sports and horseback riding). Your condition will get worse every time you experience a concussion. You should avoid these activities until you are evaluated by the appropriate follow-up health care providers. Returning To Your Regular Activities You will need to return to your normal activities slowly, not all at once. You must give your body and brain enough time for recovery.  Do not return to sports or other athletic activities until your health care provider tells you it is safe to do so.  Ask your health care provider when you can drive, ride a bicycle, or operate heavy machinery. Your ability to react may be slower after a brain injury. Never do these activities if you are dizzy.  Ask your health care provider about when you can return to work or school. Preventing Another Concussion It is very important to avoid another brain injury, especially before you have recovered. In rare cases, another injury can lead to permanent brain damage, brain swelling, or death. The risk of this is greatest during the first 7-10 days after a head injury. Avoid injuries by:  Wearing a   seat belt when riding in a car.  Drinking alcohol only in moderation.  Wearing a helmet when biking, skiing, skateboarding, skating, or doing similar activities.  Avoiding activities that could lead to a second concussion, such as contact or recreational sports, until your health care provider says it is okay.  Taking safety measures in your home.  Remove clutter and tripping hazards from floors and stairways.  Use grab bars in bathrooms and handrails by stairs.  Place non-slip mats on floors and in bathtubs.  Improve lighting in dim areas. SEEK MEDICAL CARE IF:  You have increased problems paying attention or  concentrating.  You have increased difficulty remembering or learning new information.  You need more time to complete tasks or assignments than before.  You have increased irritability or decreased ability to cope with stress.  You have more symptoms than before. Seek medical care if you have any of the following symptoms for more than 2 weeks after your injury:  Lasting (chronic) headaches.  Dizziness or balance problems.  Nausea.  Vision problems.  Increased sensitivity to noise or light.  Depression or mood swings.  Anxiety or irritability.  Memory problems.  Difficulty concentrating or paying attention.  Sleep problems.  Feeling tired all the time. SEEK IMMEDIATE MEDICAL CARE IF:  You have severe or worsening headaches. These may be a sign of a blood clot in the brain.  You have weakness (even if only in one hand, leg, or part of the face).  You have numbness.  You have decreased coordination.  You vomit repeatedly.  You have increased sleepiness.  One pupil is larger than the other.  You have convulsions.  You have slurred speech.  You have increased confusion. This may be a sign of a blood clot in the brain.  You have increased restlessness, agitation, or irritability.  You are unable to recognize people or places.  You have neck pain.  It is difficult to wake you up.  You have unusual behavior changes.  You lose consciousness. MAKE SURE YOU:  Understand these instructions.  Will watch your condition.  Will get help right away if you are not doing well or get worse.   This information is not intended to replace advice given to you by your health care provider. Make sure you discuss any questions you have with your health care provider.   Document Released: 05/28/2003 Document Revised: 03/28/2014 Document Reviewed: 09/27/2012 Elsevier Interactive Patient Education 2016 [employer]  Laceration Care, Adult A laceration is a cut  that goes through all of the layers of the skin and into the tissue that is right under the skin. Some lacerations heal on their own. Others need to be closed with stitches (sutures), staples, skin adhesive strips, or skin glue. Proper laceration care minimizes the risk of infection and helps the laceration to heal better. HOW TO CARE FOR YOUR LACERATION If sutures or staples were used:  Keep the wound clean and dry.  If you were given a bandage (dressing), you should change it at least one time per day or as told by your health care provider. You should also change it if it becomes wet or dirty.  Keep the wound completely dry for the first 24 hours or as told by your health care provider. After that time, you may shower or bathe. However, make sure that the wound is not soaked in water until after the sutures or staples have been removed.  Clean the wound one time each day   or as told by your health care provider:  Wash the wound with soap and water.  Rinse the wound with water to remove all soap.  Pat the wound dry with a clean towel. Do not rub the wound.  After cleaning the wound, apply a thin layer of antibiotic ointmentas told by your health care provider. This will help to prevent infection and keep the dressing from sticking to the wound.  Have the sutures or staples removed as told by your health care provider. If skin adhesive strips were used:  Keep the wound clean and dry.  If you were given a bandage (dressing), you should change it at least one time per day or as told by your health care provider. You should also change it if it becomes dirty or wet.  Do not get the skin adhesive strips wet. You may shower or bathe, but be careful to keep the wound dry.  If the wound gets wet, pat it dry with a clean towel. Do not rub the wound.  Skin adhesive strips fall off on their own. You may trim the strips as the wound heals. Do not remove skin adhesive strips that are still stuck  to the wound. They will fall off in time. If skin glue was used:  Try to keep the wound dry, but you may briefly wet it in the shower or bath. Do not soak the wound in water, such as by swimming.  After you have showered or bathed, gently pat the wound dry with a clean towel. Do not rub the wound.  Do not do any activities that will make you sweat heavily until the skin glue has fallen off on its own.  Do not apply liquid, cream, or ointment medicine to the wound while the skin glue is in place. Using those may loosen the film before the wound has healed.  If you were given a bandage (dressing), you should change it at least one time per day or as told by your health care provider. You should also change it if it becomes dirty or wet.  If a dressing is placed over the wound, be careful not to apply tape directly over the skin glue. Doing that may cause the glue to be pulled off before the wound has healed.  Do not pick at the glue. The skin glue usually remains in place for 5-10 days, then it falls off of the skin. General Instructions  Take over-the-counter and prescription medicines only as told by your health care provider.  If you were prescribed an antibiotic medicine or ointment, take or apply it as told by your doctor. Do not stop using it even if your condition improves.  To help prevent scarring, make sure to cover your wound with sunscreen whenever you are outside after stitches are removed, after adhesive strips are removed, or when glue remains in place and the wound is healed. Make sure to wear a sunscreen of at least 30 SPF.  Do not scratch or pick at the wound.  Keep all follow-up visits as told by your health care provider. This is important.  Check your wound every day for signs of infection. Watch for:  Redness, swelling, or pain.  Fluid, blood, or pus.  Raise (elevate) the injured area above the level of your heart while you are sitting or lying down, if  possible. SEEK MEDICAL CARE IF:  You received a tetanus shot and you have swelling, severe pain, redness, or bleeding at the   injection site.  You have a fever.  A wound that was closed breaks open.  You notice a bad smell coming from your wound or your dressing.  You notice something coming out of the wound, such as wood or glass.  Your pain is not controlled with medicine.  You have increased redness, swelling, or pain at the site of your wound.  You have fluid, blood, or pus coming from your wound.  You notice a change in the color of your skin near your wound.  You need to change the dressing frequently due to fluid, blood, or pus draining from the wound.  You develop a new rash.  You develop numbness around the wound. SEEK IMMEDIATE MEDICAL CARE IF:  You develop severe swelling around the wound.  Your pain suddenly increases and is severe.  You develop painful lumps near the wound or on skin that is anywhere on your body.  You have a red streak going away from your wound.  The wound is on your hand or foot and you cannot properly move a finger or toe.  The wound is on your hand or foot and you notice that your fingers or toes look pale or bluish.   This information is not intended to replace advice given to you by your health care provider. Make sure you discuss any questions you have with your health care provider.   Document Released: 03/07/2005 Document Revised: 07/22/2014 Document Reviewed: 03/03/2014 Elsevier Interactive Patient Education 2016 [employer] Inc.  

## 2015-03-30 NOTE — ED Provider Notes (Signed)
CSN: [id number]     Arrival date & time 03/30/15  1140 History  By signing my name below, I, Crystal Benitez, attest that this documentation has been prepared under the direction and in the presence of Roxy Horsemanobert Lea Baine, PA-C Electronically Signed: Soijett Benitez, ED Scribe. 03/30/2015. 12:26 PM.   Chief Complaint  Patient presents with  . Fall  . Head Injury      The history is provided by the patient. No language interpreter was used.    Crystal Benitez is a 35 y.o. female who presents to the Emergency Department complaining of an unwitnessed fall onset last night at 10 PM. Pt notes that she slipped and fell straight back and hit her head on ice while ambulating. Pt is having associated symptoms of hitting her head, right head pain behind right ear, laceration behind right ear with controlled bleeding, questionable brief LOC, and intermittent nausea. She is currently unsure of the status of her tetanus. She notes that she has tried applying pressure and a bandaid with no relief of her symptoms. She denies HA, vomiting, weakness, numbness, difficulty speaking, and any other symptoms. Denies blood thinners at this time.    Past Medical History  Diagnosis Date  . Kidney stone    Past Surgical History  Procedure Laterality Date  . Cholecystectomy    . Tubal ligation     No family history on file. Social History  Substance Use Topics  . Smoking status: Never Smoker   . Smokeless tobacco: Never Used  . Alcohol Use: No   OB History    No data available     Review of Systems  Eyes: Negative for visual disturbance.  Gastrointestinal: Positive for nausea. Negative for vomiting.  Skin: Positive for wound (laceration behind right ear).  Neurological: Positive for syncope. Negative for speech difficulty, weakness and numbness.     Allergies  Review of patient's allergies indicates no known allergies.  Home Medications   Prior to Admission medications   Medication Sig Start Date End  Date Taking? Authorizing Provider  albuterol (PROVENTIL HFA;VENTOLIN HFA) 108 (90 BASE) MCG/ACT inhaler Inhale 2 puffs into the lungs every 4 (four) hours as needed for wheezing. 09/27/12   Gilda Creasehristopher J Pollina, MD  azithromycin (ZITHROMAX Z-PAK) 250 MG tablet Take 1 tablet (250 mg total) by mouth daily. 500mg  PO day 1, then 250mg  PO days 205 Patient not taking: Reported on 02/17/2014 01/17/13   Roxy Horsemanobert Tamre Cass, PA-C  ibuprofen (ADVIL,MOTRIN) 200 MG tablet Take 400 mg by mouth every 6 (six) hours as needed for mild pain or moderate pain.    Historical Provider, MD  ondansetron (ZOFRAN) 4 MG tablet Take 1 tablet (4 mg total) by mouth every 6 (six) hours. 02/17/14   Oswaldo ConroyVictoria Creech, PA-C   LMP 03/10/2015 Physical Exam  Constitutional: She is oriented to person, place, and time. She appears well-developed and well-nourished. No distress.  HENT:  Head: Normocephalic and atraumatic.  Right Ear: External ear normal.  Left Ear: External ear normal.  Eyes: Conjunctivae and EOM are normal. Pupils are equal, round, and reactive to light.  Neck: Normal range of motion. Neck supple.  No pain with neck flexion, no meningismus  Cardiovascular: Normal rate, regular rhythm and normal heart sounds.  Exam reveals no gallop and no friction rub.   No murmur heard. Pulmonary/Chest: Effort normal and breath sounds normal. No respiratory distress. She has no wheezes. She has no rales. She exhibits no tenderness.  Abdominal: Soft. She exhibits no distension and no  mass. There is no tenderness. There is no rebound and no guarding.  Musculoskeletal: Normal range of motion. She exhibits no edema or tenderness.  Normal gait.  Neurological: She is alert and oriented to person, place, and time.  CN 3-12 intact, speech is clear and movements goal oriented, sensation and strength intact bilaterally.  Skin: Skin is warm and dry. Laceration noted.  1 cm laceration behind right ear. No foreign body. Bleeding is controlled.   Psychiatric: She has a normal mood and affect. Her behavior is normal. Judgment and thought content normal.  Nursing note and vitals reviewed.    ED Course  Procedures (including critical care time) COORDINATION OF CARE: 12:23 PM Discussed treatment plan with pt at bedside which includes bacitracin, tetanus updated, and steri-strip and pt agreed to plan.     MDM   Final diagnoses:  Fall, initial encounter  Concussion, without loss of consciousness, initial encounter  Laceration    Patient with mechanical fall on ice last night.  Questionable LOC.  No indication for imaging based on [place] head CT rules.  Patient is more than 12 hours away from injury.  Feel fine now.  Neurovascularly intact.  Has a small laceration behind right ear, but given the length of time that this has been open, will allow for closure by secondary intention.  Tdap updated.  Patient is stable and ready for discharge.  Information and return precautions for concussion given to patient in verbal and written form.  I personally performed the services described in this documentation, which was scribed in my presence. The recorded information has been reviewed and is accurate.      Roxy Horseman, PA-C 03/30/15 1246  Roxy Horseman, PA-C 03/30/15 1247  Doug Sou, MD 03/30/15 1526

## 2015-09-04 ENCOUNTER — Emergency Department (HOSPITAL_COMMUNITY)
Admission: EM | Admit: 2015-09-04 | Discharge: 2015-09-04 | Disposition: A | Discharge status: Home / Self Care | Payer: BLUE CROSS/BLUE SHIELD | Attending: Dermatology | Admitting: Dermatology

## 2015-09-04 ENCOUNTER — Encounter (HOSPITAL_COMMUNITY): Payer: Self-pay | Admitting: *Deleted

## 2015-09-04 ENCOUNTER — Emergency Department (HOSPITAL_COMMUNITY): Payer: BLUE CROSS/BLUE SHIELD

## 2015-09-04 DIAGNOSIS — M25562 Pain in left knee: Secondary | ICD-10-CM | POA: Diagnosis present

## 2015-09-04 DIAGNOSIS — F17210 Nicotine dependence, cigarettes, uncomplicated: Secondary | ICD-10-CM | POA: Diagnosis not present

## 2015-09-04 DIAGNOSIS — Z5321 Procedure and treatment not carried out due to patient leaving prior to being seen by health care provider: Secondary | ICD-10-CM | POA: Diagnosis not present

## 2015-09-04 NOTE — ED Notes (Signed)
Pt eloped from the waiting room; unable to locate pt in the waiting room

## 2015-09-04 NOTE — ED Notes (Signed)
No answer when pt's name called in the waiting room 
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
## 2015-09-05 ENCOUNTER — Encounter (HOSPITAL_COMMUNITY): Payer: Self-pay | Admitting: Emergency Medicine

## 2015-09-05 ENCOUNTER — Emergency Department (HOSPITAL_COMMUNITY)
Admission: EM | Admit: 2015-09-05 | Discharge: 2015-09-05 | Disposition: A | Discharge status: Home / Self Care | Payer: BLUE CROSS/BLUE SHIELD | Attending: Emergency Medicine | Admitting: Emergency Medicine

## 2015-09-05 DIAGNOSIS — Z79899 Other long term (current) drug therapy: Secondary | ICD-10-CM | POA: Diagnosis not present

## 2015-09-05 DIAGNOSIS — F17210 Nicotine dependence, cigarettes, uncomplicated: Secondary | ICD-10-CM | POA: Insufficient documentation

## 2015-09-05 DIAGNOSIS — Y999 Unspecified external cause status: Secondary | ICD-10-CM | POA: Diagnosis not present

## 2015-09-05 DIAGNOSIS — Y9289 Other specified places as the place of occurrence of the external cause: Secondary | ICD-10-CM | POA: Diagnosis not present

## 2015-09-05 DIAGNOSIS — Y939 Activity, unspecified: Secondary | ICD-10-CM | POA: Insufficient documentation

## 2015-09-05 DIAGNOSIS — S8992XA Unspecified injury of left lower leg, initial encounter: Secondary | ICD-10-CM | POA: Diagnosis present

## 2015-09-05 DIAGNOSIS — S8392XA Sprain of unspecified site of left knee, initial encounter: Secondary | ICD-10-CM | POA: Insufficient documentation

## 2015-09-05 MED ORDER — HYDROCODONE-ACETAMINOPHEN 5-325 MG PO TABS
2.0000 | ORAL_TABLET | Freq: Once | ORAL | Status: AC
  Administered 2015-09-05: 2 via ORAL
  Filled 2015-09-05: qty 2

## 2015-09-05 MED ORDER — HYDROCODONE-ACETAMINOPHEN 5-325 MG PO TABS: 1.0000 | ORAL_TABLET | Freq: Four times a day (QID) | ORAL | Status: DC | PRN

## 2015-09-05 NOTE — Discharge Instructions (Signed)
Wear knee immobilizer as applied for the next several days.  Ice for 20 minutes every 2 hours while awake for the next 2 days.  Keep your knee elevated as much as possible.  Follow-up with your primary Dr. if you're not improving in the next week to discuss further imaging.   Knee Sprain A knee sprain is a tear in one of the strong, fibrous tissues that connect the bones (ligaments) in your knee. The severity of the sprain depends on how much of the ligament is torn. The tear can be either partial or complete. CAUSES  Often, sprains are a result of a fall or injury. The force of the impact causes the fibers of your ligament to stretch too much. This excess tension causes the fibers of your ligament to tear. SIGNS AND SYMPTOMS  You may have some loss of motion in your knee. Other symptoms include:  Bruising.  Pain in the knee area.  Tenderness of the knee to the touch.  Swelling. DIAGNOSIS  To diagnose a knee sprain, your health care provider will physically examine your knee. Your health care provider may also suggest an X-ray exam of your knee to make sure no bones are broken. TREATMENT  If your ligament is only partially torn, treatment usually involves keeping the knee in a fixed position (immobilization) or bracing your knee for activities that require movement for several weeks. To do this, your health care provider will apply a bandage, cast, or splint to keep your knee from moving and to support your knee during movement until it heals. For a partially torn ligament, the healing process usually takes 4-6 weeks. If your ligament is completely torn, depending on which ligament it is, you may need surgery to reconnect the ligament to the bone or reconstruct it. After surgery, a cast or splint may be applied and will need to stay on your knee for 4-6 weeks while your ligament heals. HOME CARE INSTRUCTIONS  Keep your injured knee elevated to decrease swelling.  To ease pain and  swelling, apply ice to the injured area:  Put ice in a plastic bag.  Place a towel between your skin and the bag.  Leave the ice on for 20 minutes, 2-3 times a day.  Only take medicine for pain as directed by your health care provider.  Do not leave your knee unprotected until pain and stiffness go away (usually 4-6 weeks).  If you have a cast or splint, do not allow it to get wet. If you have been instructed not to remove it, cover it with a plastic bag when you shower or bathe. Do not swim.  Your health care provider may suggest exercises for you to do during your recovery to prevent or limit permanent weakness and stiffness. SEEK IMMEDIATE MEDICAL CARE IF:  Your cast or splint becomes damaged.  Your pain becomes worse.  You have significant pain, swelling, or numbness below the cast or splint. MAKE SURE YOU:  Understand these instructions.  Will watch your condition.  Will get help right away if you are not doing well or get worse.   This information is not intended to replace advice given to you by your health care provider. Make sure you discuss any questions you have with your health care provider.   Document Released: 03/07/2005 Document Revised: 03/28/2014 Document Reviewed: 10/17/2012 Elsevier Interactive Patient Education [employer]

## 2015-09-05 NOTE — ED Notes (Signed)
Patient here with complaints of left knee pain. Here yesterday for same left AMA. X-rays done. Pain 10/10, denies injury.
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
## 2015-12-07 IMAGING — CT CT RENAL STONE PROTOCOL
1 series · 15 of 27 positions shown, 19 images · non-contrast
Comparison: None.

CLINICAL DATA: Three a history of flank pain

EXAM:
CT ABDOMEN AND PELVIS WITHOUT CONTRAST
TECHNIQUE: Multidetector CT imaging of the abdomen and pelvis was performed
following the standard protocol without oral or intravenous contrast
material administration.

[Series 4: lung · axial · 0.74mm/px · z∈[+1543,+1663]mm · 15 of 27 slices shown, 19 images]
[im 2/27  soft-tissue]
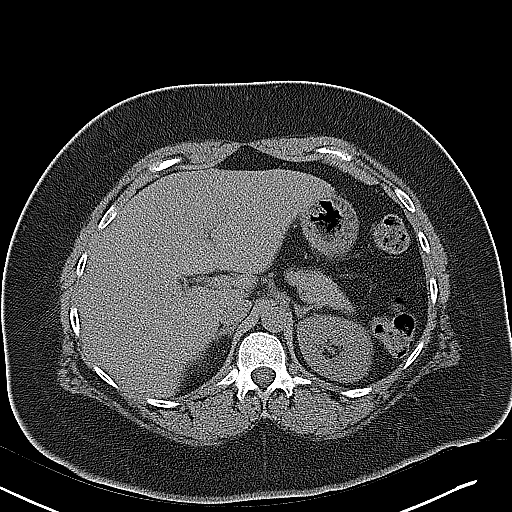
[im 2/27  bone]
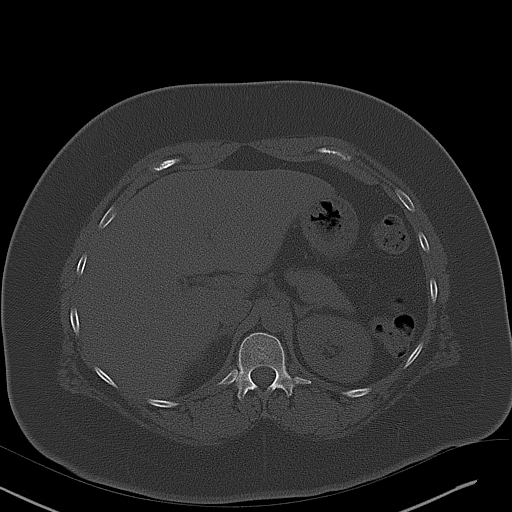
[im 4/27  soft-tissue]
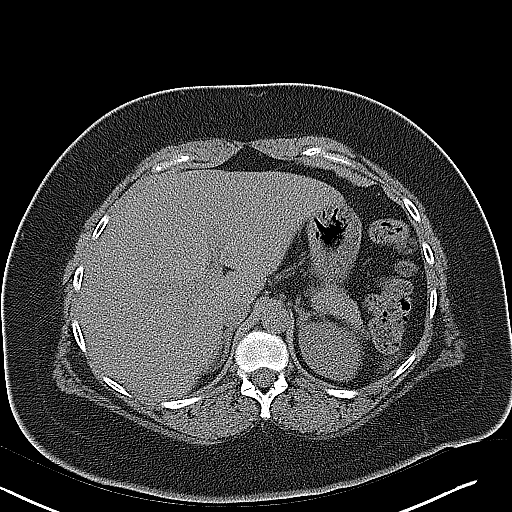
[im 6/27  soft-tissue]
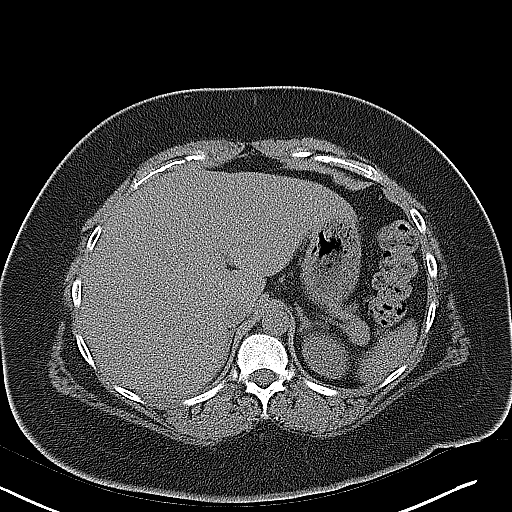
[im 8/27  soft-tissue]
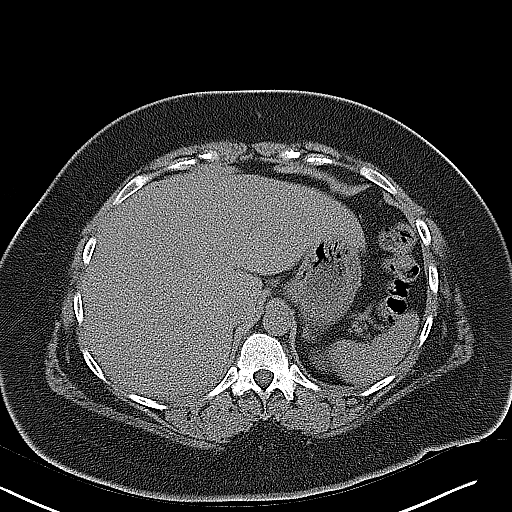
[im 10/27  soft-tissue]
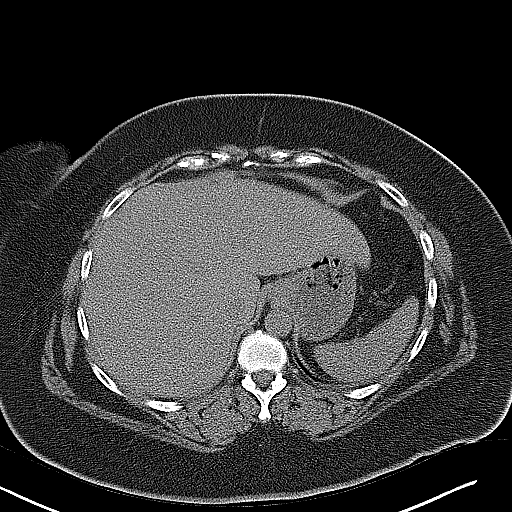
[im 12/27  soft-tissue]
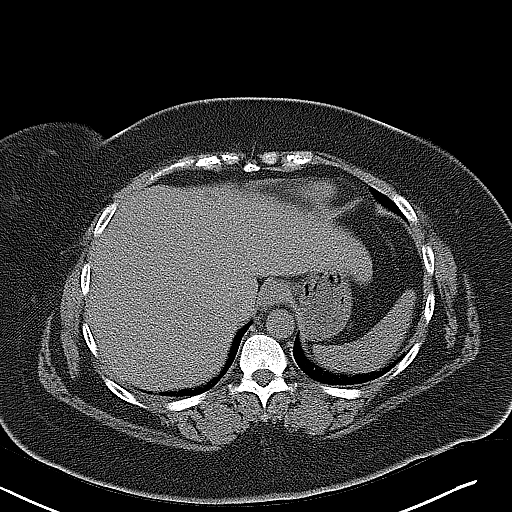
[im 14/27  soft-tissue]
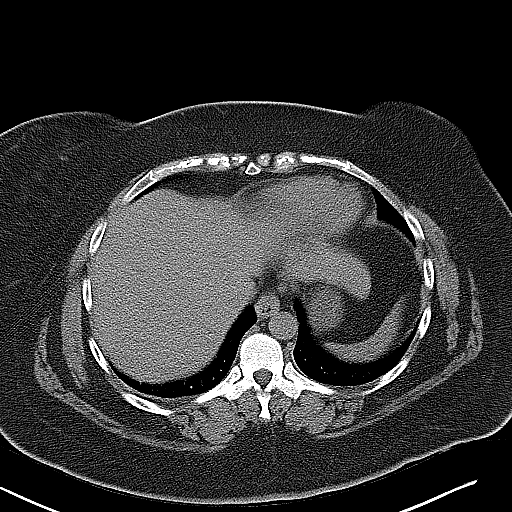
[im 16/27  soft-tissue]
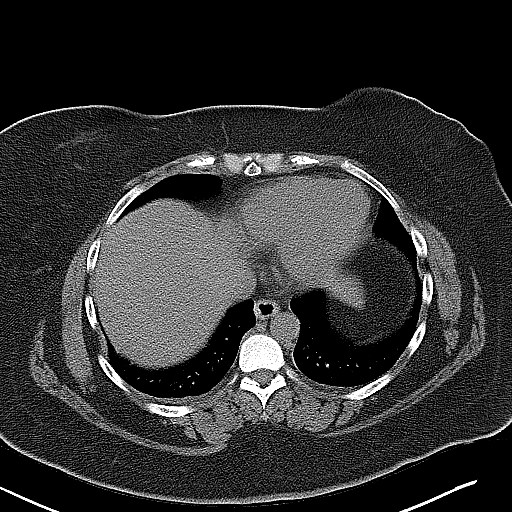
[im 18/27  soft-tissue]
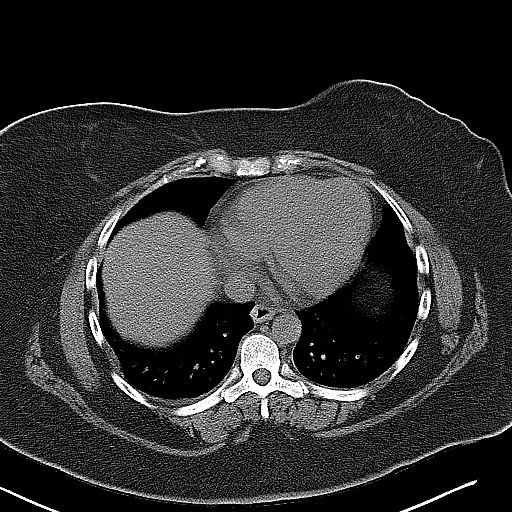
[im 18/27  bone]
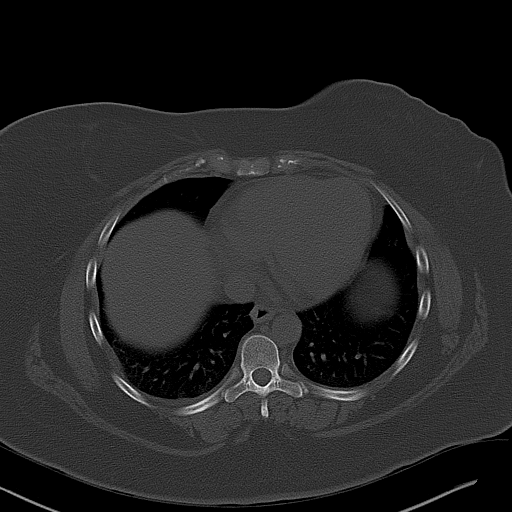
[im 20/27  soft-tissue]
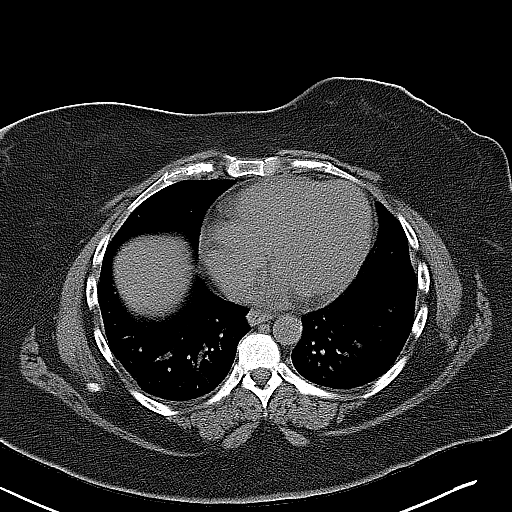
[im 22/27  soft-tissue]
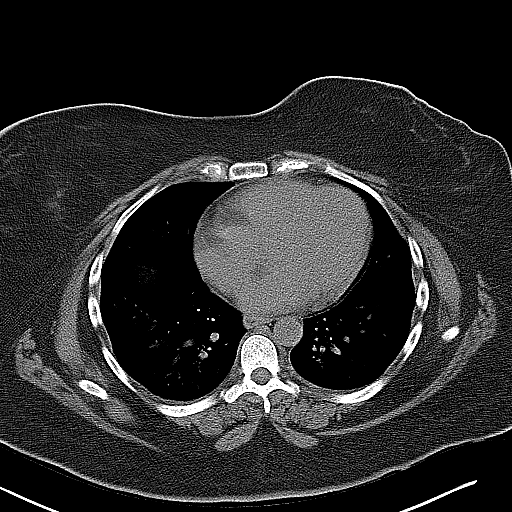
[im 23/27  lung]
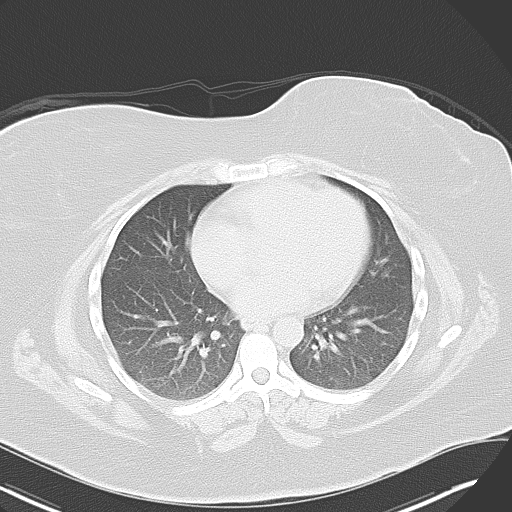
[im 24/27  soft-tissue]
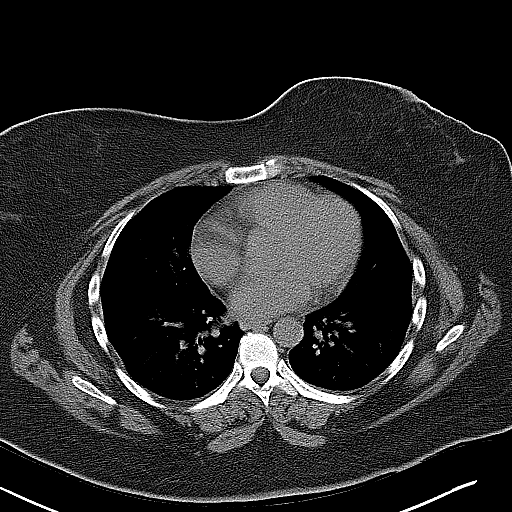
[im 24/27  lung]
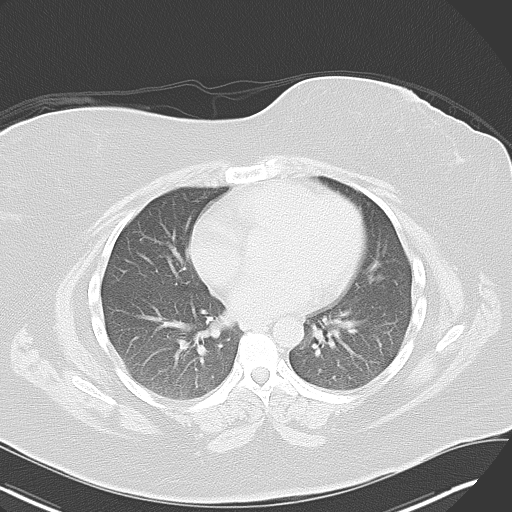
[im 25/27  lung]
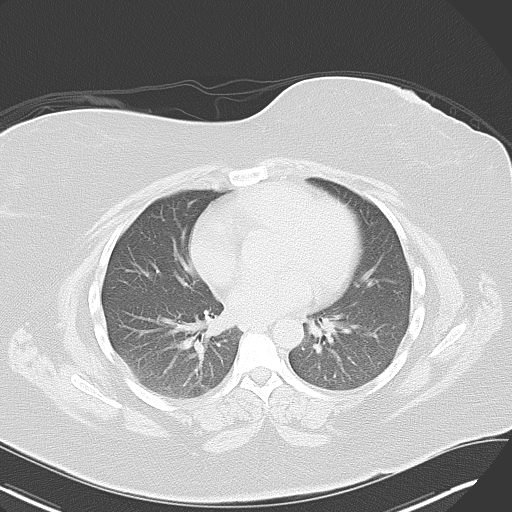
[im 26/27  soft-tissue]
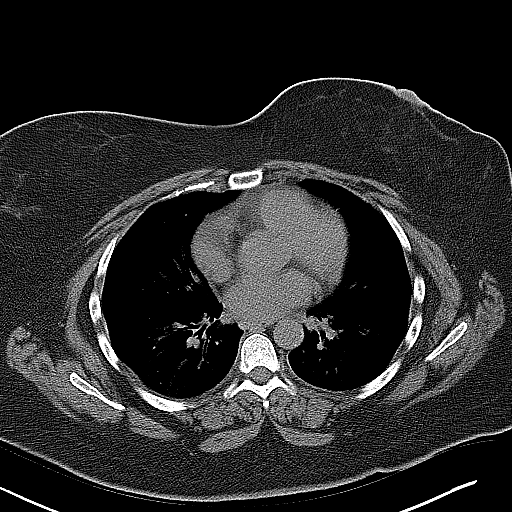
[im 26/27  lung]
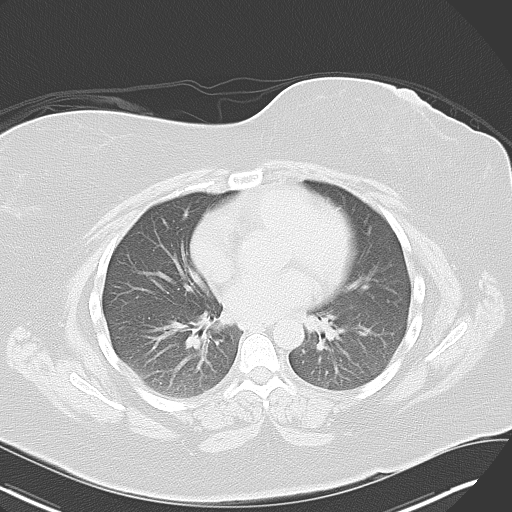

[15 of 27 positions shown; findings below may reference images not displayed]

FINDINGS: Lung bases are clear.

Liver is prominent measuring 21.5 cm in length. No focal liver
lesions are identified on this noncontrast enhanced study.
Gallbladder is absent. There is no biliary duct dilatation.

Spleen, pancreas, and adrenals appear normal.

Kidneys bilaterally show no appreciable mass or hydronephrosis on
either side. There is no renal or ureteral calculus on either side.
Several phleboliths are near but separate from the ureters distally
on both sides.

In the pelvis, the urinary bladder is midline with wall thickness
within normal limits. There is no pelvic mass or fluid. There are
scattered sigmoid diverticula without diverticulitis. The appendix
appears normal. Terminal ileum appears normal.

There is a small ventral hernia containing only fat.

There is no bowel obstruction. No free air or portal venous air.
There is no appreciable ascites, adenopathy, or abscess in the
abdomen or pelvis. There is no demonstrable abdominal aortic
aneurysm. There are no blastic or lytic bone lesions.
IMPRESSION: No renal or ureteral calculus.  No hydronephrosis.

Appendix appears normal.  No bowel obstruction.  No abscess.

Prominent liver without focal lesion. Gallbladder absent. Small
ventral hernia containing only fat. Scattered sigmoid diverticula
without diverticulitis. 11

## 2016-04-27 ENCOUNTER — Encounter (HOSPITAL_COMMUNITY): Payer: Self-pay | Admitting: Emergency Medicine

## 2016-04-27 ENCOUNTER — Emergency Department (HOSPITAL_COMMUNITY)
Admission: EM | Admit: 2016-04-27 | Discharge: 2016-04-27 | Disposition: A | Discharge status: Home / Self Care | Payer: BLUE CROSS/BLUE SHIELD | Attending: Emergency Medicine | Admitting: Emergency Medicine

## 2016-04-27 DIAGNOSIS — F17210 Nicotine dependence, cigarettes, uncomplicated: Secondary | ICD-10-CM | POA: Insufficient documentation

## 2016-04-27 DIAGNOSIS — M79605 Pain in left leg: Secondary | ICD-10-CM | POA: Insufficient documentation

## 2016-04-27 DIAGNOSIS — Z79899 Other long term (current) drug therapy: Secondary | ICD-10-CM | POA: Insufficient documentation

## 2016-04-27 DIAGNOSIS — R000 Tachycardia, unspecified: Secondary | ICD-10-CM | POA: Insufficient documentation

## 2016-04-27 LAB — CBC WITH DIFFERENTIAL/PLATELET
BASOS PCT: 0 %
Basophils Absolute: 0 10*3/uL (ref 0.0–0.1)
EOS ABS: 0.1 10*3/uL (ref 0.0–0.7)
EOS PCT: 1 %
HCT: 39.7 % (ref 36.0–46.0)
Hemoglobin: 13.4 g/dL (ref 12.0–15.0)
Lymphocytes Relative: 33 %
Lymphs Abs: 2.9 10*3/uL (ref 0.7–4.0)
MCH: 29 pg (ref 26.0–34.0)
MCHC: 33.8 g/dL (ref 30.0–36.0)
MCV: 85.9 fL (ref 78.0–100.0)
MONO ABS: 0.6 10*3/uL (ref 0.1–1.0)
Monocytes Relative: 7 %
NEUTROS ABS: 5.1 10*3/uL (ref 1.7–7.7)
Neutrophils Relative %: 59 %
PLATELETS: ADEQUATE 10*3/uL (ref 150–400)
RBC: 4.62 MIL/uL (ref 3.87–5.11)
RDW: 14.5 % (ref 11.5–15.5)
WBC: 8.7 10*3/uL (ref 4.0–10.5)

## 2016-04-27 LAB — BASIC METABOLIC PANEL
Anion gap: 10 (ref 5–15)
BUN: 9 mg/dL (ref 6–20)
CALCIUM: 10.5 mg/dL — AB (ref 8.9–10.3)
CHLORIDE: 102 mmol/L (ref 101–111)
CO2: 24 mmol/L (ref 22–32)
CREATININE: 0.83 mg/dL (ref 0.44–1.00)
GFR calc non Af Amer: >60 mL/min (ref >60)
GLUCOSE: 102 mg/dL — AB (ref 65–99)
Potassium: 4.2 mmol/L (ref 3.5–5.1)
Sodium: 136 mmol/L (ref 135–145)

## 2016-04-27 LAB — POC URINE PREG, ED: Preg Test, Ur: NEGATIVE

## 2016-04-27 LAB — D-DIMER, QUANTITATIVE (NOT AT ARMC): D DIMER QUANT: 0.31 ug{FEU}/mL (ref 0.00–0.50)

## 2016-04-27 MED ORDER — KETOROLAC TROMETHAMINE 15 MG/ML IJ SOLN
15.0000 mg | Freq: Once | INTRAMUSCULAR | Status: AC
  Administered 2016-04-27: 15 mg via INTRAVENOUS
  Filled 2016-04-27: qty 1

## 2016-04-27 MED ORDER — ONDANSETRON 4 MG PO TBDP
4.0000 mg | ORAL_TABLET | Freq: Once | ORAL | Status: AC | PRN
  Administered 2016-04-27: 4 mg via ORAL
  Filled 2016-04-27: qty 1

## 2016-04-27 MED ORDER — SODIUM CHLORIDE 0.9 % IV BOLUS (SEPSIS)
1000.0000 mL | Freq: Once | INTRAVENOUS | Status: AC
  Administered 2016-04-27: 1000 mL via INTRAVENOUS

## 2016-04-27 MED ORDER — IBUPROFEN 600 MG PO TABS: 600.0000 mg | ORAL_TABLET | Freq: Three times a day (TID) | ORAL | 0 refills | Status: DC | PRN

## 2016-04-27 MED ORDER — FENTANYL CITRATE (PF) 100 MCG/2ML IJ SOLN
100.0000 ug | Freq: Once | INTRAMUSCULAR | Status: AC
  Administered 2016-04-27: 100 ug via INTRAVENOUS
  Filled 2016-04-27: qty 2

## 2016-04-27 MED ORDER — ONDANSETRON HCL 4 MG/2ML IJ SOLN
4.0000 mg | Freq: Once | INTRAMUSCULAR | Status: AC
  Administered 2016-04-27: 4 mg via INTRAVENOUS
  Filled 2016-04-27: qty 2

## 2016-04-27 MED ORDER — FENTANYL CITRATE (PF) 100 MCG/2ML IJ SOLN
50.0000 ug | Freq: Once | INTRAMUSCULAR | Status: AC
  Administered 2016-04-27: 50 ug via INTRAVENOUS
  Filled 2016-04-27: qty 2

## 2016-04-27 NOTE — Discharge Instructions (Signed)

## 2016-04-27 NOTE — ED Notes (Signed)
Pt heart rate in the 140's. Encouraged patient to slow her breathing.

## 2016-04-27 NOTE — ED Notes (Signed)
Pt ambulatory to restroom

## 2016-04-27 NOTE — ED Triage Notes (Addendum)
Pt comes with complaints of left leg swelling and pain and nausea.  Pt states she feels like she cannot catch her breath.  100% oxygen saturation, no swelling noted on assessment, and breath sounds clear. Pulses in tact.

## 2016-04-27 NOTE — ED Notes (Signed)
Bed: WA21 Expected date:  Expected time:  Means of arrival:  Comments: 

## 2016-04-27 NOTE — ED Provider Notes (Signed)
[facility]-EMERGENCY DEPT Provider Note   CSN: [id number] Arrival date & time: 04/27/16  [id number]  By signing my name below, I, Crystal Benitez, attest that this documentation has been prepared under the direction and in the presence of Crystal Rhineonald Cherilynn Schomburg, MD. Electronically Signed: Elder Negusussell Benitez, Scribe. 04/27/16. 3:01 AM.   History   Chief Complaint Chief Complaint  Patient presents with  . Leg Pain  . Shortness of Breath  . Nausea    HPI Crystal Benitez is a 36 y.o. female without any chronic medical problems who presents to the emergency department.   The history is provided by the patient. No language interpreter was used.  Leg Pain   The current episode started 6 to 12 hours ago. The problem occurs constantly. The problem has been gradually worsening. Pain location: Posterior aspect of the "entire L leg" greatest behind the L knee. The quality of the pain is described as sharp. The pain is severe. Associated symptoms comments: Also reporting nausea with 1 instance of vomiting in triage. Had an episode of dyspnea yesterday afternoon in the setting of anxiety which resolved. At interview, denies any chest pain or dyspnea. She denies any painful respirations since the onset of her leg pain. . There has been no history of extremity trauma.   Denies any history of PE/DVT, bleeding or clotting disorders. Denies prior surgeries.  She is not on OCPs at this time  PMH - none Soc hx - smoker  Past Surgical History:  Procedure Laterality Date  . CHOLECYSTECTOMY    . TUBAL LIGATION      OB History    No data available       Home Medications    Prior to Admission medications   Medication Sig Start Date End Date Taking? Authorizing Provider  albuterol (PROVENTIL HFA;VENTOLIN HFA) 108 (90 BASE) MCG/ACT inhaler Inhale 2 puffs into the lungs every 4 (four) hours as needed for wheezing. Patient not taking: Reported on 04/27/2016 09/27/12   Crystal Creasehristopher J Pollina, MD  azithromycin (ZITHROMAX  Z-PAK) 250 MG tablet Take 1 tablet (250 mg total) by mouth daily. 500mg  PO day 1, then 250mg  PO days 205 Patient not taking: Reported on 02/17/2014 01/17/13   Crystal Horsemanobert Browning, PA-C  HYDROcodone-acetaminophen (NORCO) 5-325 MG tablet Take 1-2 tablets by mouth every 6 (six) hours as needed. Patient not taking: Reported on 04/27/2016 09/05/15   Crystal Lyonsouglas Delo, MD  ondansetron (ZOFRAN) 4 MG tablet Take 1 tablet (4 mg total) by mouth every 6 (six) hours. Patient not taking: Reported on 04/27/2016 02/17/14   Crystal ConroyVictoria Creech, PA-C    Family History No family history on file.  Social History Social History  Substance Use Topics  . Smoking status: Current Every Day Smoker    Packs/day: 0.50    Types: Cigarettes  . Smokeless tobacco: Never Used  . Alcohol use Yes     Comment: occasional      Allergies   Patient has no known allergies.   Review of Systems Review of Systems  Respiratory: Negative for shortness of breath.   Cardiovascular: Negative for chest pain.  Musculoskeletal:       L leg pain  All other systems reviewed and are negative.    Physical Exam Updated Vital Signs BP (!) 138/118 (BP Location: Right Arm)   Pulse (!) 127   Temp 97.8 F (36.6 C) (Oral)   Resp 22   Ht 5\' 6"  (1.676 m)   Wt 220 lb (99.8 kg)   LMP 04/04/2016 (Approximate)  SpO2 100%   BMI 35.51 kg/m   Physical Exam CONSTITUTIONAL: Well developed/well nourished HEAD: Normocephalic/atraumatic EYES: EOMI/PERRL ENMT: Mucous membranes moist NECK: supple no meningeal signs SPINE/BACK:entire spine nontender CV: S1/S2 noted, no murmurs/rubs/gallops noted LUNGS: Lungs are clear to auscultation bilaterally, no apparent distress ABDOMEN: soft, nontender, no rebound or guarding, bowel sounds noted throughout abdomen GU:no cva tenderness NEURO: Pt is awake/alert/appropriate, moves all extremitiesx4.  No facial droop.   EXTREMITIES: pulses normal/equal, full ROM, tenderness to L calf and L thigh. No erythema or  streaking noted. No lower extremity edema noted.  SKIN: warm, color normal PSYCH: no abnormalities of mood noted, alert and oriented to situation   ED Treatments / Results  DIAGNOSTIC STUDIES: Oxygen Saturation is 100 percent on room air which is normal by my interpretation.    COORDINATION OF CARE: 3:01 AM Discussed treatment plan with pt at bedside and pt agreed to plan.  Labs (all labs ordered are listed, but only abnormal results are displayed) Labs Reviewed  BASIC METABOLIC PANEL - Abnormal; Notable for the following:       Result Value   Glucose, Bld 102 (*)    Calcium 10.5 (*)    All other components within normal limits  CBC WITH DIFFERENTIAL/PLATELET  D-DIMER, QUANTITATIVE (NOT AT [facility])  POC URINE PREG, ED    EKG  EKG Interpretation  Date/Time:  Wednesday April 27 2016 01:19:46 EST Ventricular Rate:  130 PR Interval:    QRS Duration: 103 QT Interval:  311 QTC Calculation: 458 R Axis:   76 Text Interpretation:  Sinus tachycardia RSR' in V1 or V2, right VCD or RVH Probable inferior infarct, old Anterolateral Q wave, probably normal for age Baseline wander in lead(s) I III aVL No previous ECGs available Confirmed by Bebe Shaggy  MD, Thelton Graca ([id number]) on 04/27/2016 1:34:26 AM       Radiology No results found.  Procedures Procedures (including critical care time)  Medications Ordered in ED Medications  ondansetron (ZOFRAN-ODT) disintegrating tablet 4 mg (4 mg Oral Given 04/27/16 0152)  fentaNYL (SUBLIMAZE) injection 100 mcg (100 mcg Intravenous Given 04/27/16 0356)  ondansetron (ZOFRAN) injection 4 mg (4 mg Intravenous Given 04/27/16 0356)  fentaNYL (SUBLIMAZE) injection 50 mcg (50 mcg Intravenous Given 04/27/16 0516)  ketorolac (TORADOL) 15 MG/ML injection 15 mg (15 mg Intravenous Given 04/27/16 0516)  sodium chloride 0.9 % bolus 1,000 mL (0 mLs Intravenous Stopped 04/27/16 0634)     Initial Impression / Assessment and Plan / ED Course  I have reviewed the triage vital  signs and the nursing notes.  Pertinent labs & imaging results that were available during my care of the patient were reviewed by me and considered in my medical decision making (see chart for details).     5:03 AM PT WITHOUT LE EDEMA/ERYTHEMA PULSES ARE INTACT LOW RISK BY WELL CRITERIA WITH NEGATIVE D-DIMER LOW SUSPICION FOR VTE SHE REPORTS PAIN STARTS IN BUTTOCKS AND DOWN INTO LEG, THEREFORE RADICULOPATHY IS POSSIBLE NO FOCAL NEURO DEFICITS 6:47 AM  Pt improved Heart rate improved No signs of DVT on exam (no edema, no erythema) Distal pulses intact She has no neuro deficits She reports pain starts in buttock, suspect there may be radiculopathy component and tachycardia may have been related to pain/anxiety With negative d-dimer, my suspicion for DVT/PE is low   We discussed strict ER return precautions  Final Clinical Impressions(s) / ED Diagnoses   Final diagnoses:  Left leg pain  Tachycardia    New Prescriptions Discharge Medication List  as of 04/27/2016  6:19 AM    START taking these medications   Details  ibuprofen (ADVIL,MOTRIN) 600 MG tablet Take 1 tablet (600 mg total) by mouth every 8 (eight) hours as needed., Starting Wed 04/27/2016, Print      I personally performed the services described in this documentation, which was scribed in my presence. The recorded information has been reviewed and is accurate.       Crystal Rhine, MD 04/27/16 [phone]

## 2020-02-29 ENCOUNTER — Other Ambulatory Visit: Payer: Self-pay

## 2020-02-29 ENCOUNTER — Inpatient Hospital Stay
Admission: EM | Admit: 2020-02-29 | Discharge: 2020-03-02 | DRG: 064 | Disposition: A | Discharge status: Home / Self Care | Payer: Medicaid Other | Attending: Internal Medicine | Admitting: Internal Medicine

## 2020-02-29 ENCOUNTER — Emergency Department: Payer: Medicaid Other

## 2020-02-29 ENCOUNTER — Encounter: Payer: Self-pay | Admitting: Emergency Medicine

## 2020-02-29 ENCOUNTER — Inpatient Hospital Stay: Payer: Medicaid Other

## 2020-02-29 DIAGNOSIS — F17210 Nicotine dependence, cigarettes, uncomplicated: Secondary | ICD-10-CM | POA: Diagnosis present

## 2020-02-29 DIAGNOSIS — I63511 Cerebral infarction due to unspecified occlusion or stenosis of right middle cerebral artery: Secondary | ICD-10-CM | POA: Diagnosis present

## 2020-02-29 DIAGNOSIS — R29704 NIHSS score 4: Secondary | ICD-10-CM | POA: Diagnosis present

## 2020-02-29 DIAGNOSIS — G839 Paralytic syndrome, unspecified: Secondary | ICD-10-CM | POA: Diagnosis present

## 2020-02-29 DIAGNOSIS — I639 Cerebral infarction, unspecified: Secondary | ICD-10-CM | POA: Diagnosis not present

## 2020-02-29 DIAGNOSIS — G936 Cerebral edema: Secondary | ICD-10-CM | POA: Diagnosis present

## 2020-02-29 DIAGNOSIS — F17213 Nicotine dependence, cigarettes, with withdrawal: Secondary | ICD-10-CM | POA: Diagnosis not present

## 2020-02-29 DIAGNOSIS — G589 Mononeuropathy, unspecified: Secondary | ICD-10-CM | POA: Diagnosis present

## 2020-02-29 DIAGNOSIS — I10 Essential (primary) hypertension: Secondary | ICD-10-CM | POA: Diagnosis present

## 2020-02-29 DIAGNOSIS — Z20822 Contact with and (suspected) exposure to covid-19: Secondary | ICD-10-CM | POA: Diagnosis present

## 2020-02-29 DIAGNOSIS — I6389 Other cerebral infarction: Secondary | ICD-10-CM | POA: Diagnosis not present

## 2020-02-29 DIAGNOSIS — F17200 Nicotine dependence, unspecified, uncomplicated: Secondary | ICD-10-CM | POA: Diagnosis present

## 2020-02-29 HISTORY — DX: Cerebral infarction, unspecified: I63.9

## 2020-02-29 LAB — COMPREHENSIVE METABOLIC PANEL
ALT: 15 U/L (ref 0–44)
AST: 20 U/L (ref 15–41)
Albumin: 3.6 g/dL (ref 3.5–5.0)
Alkaline Phosphatase: 65 U/L (ref 38–126)
Anion gap: 11 (ref 5–15)
BUN: 8 mg/dL (ref 6–20)
CO2: 23 mmol/L (ref 22–32)
Calcium: 10.2 mg/dL (ref 8.9–10.3)
Chloride: 105 mmol/L (ref 98–111)
Creatinine, Ser: 0.5 mg/dL (ref 0.44–1.00)
GFR, Estimated: >60 mL/min (ref >60)
Glucose, Bld: 116 mg/dL — ABNORMAL HIGH (ref 70–99)
Potassium: 3.4 mmol/L — ABNORMAL LOW (ref 3.5–5.1)
Sodium: 139 mmol/L (ref 135–145)
Total Bilirubin: 0.5 mg/dL (ref 0.3–1.2)
Total Protein: 7.5 g/dL (ref 6.5–8.1)

## 2020-02-29 LAB — RESP PANEL BY RT-PCR (FLU A&B, COVID) ARPGX2
Influenza A by PCR: NEGATIVE
Influenza B by PCR: NEGATIVE
SARS Coronavirus 2 by RT PCR: NEGATIVE

## 2020-02-29 LAB — CBC
HCT: 31.7 % — ABNORMAL LOW (ref 36.0–46.0)
Hemoglobin: 10 g/dL — ABNORMAL LOW (ref 12.0–15.0)
MCH: 23 pg — ABNORMAL LOW (ref 26.0–34.0)
MCHC: 31.5 g/dL (ref 30.0–36.0)
MCV: 72.9 fL — ABNORMAL LOW (ref 80.0–100.0)
Platelets: 394 10*3/uL (ref 150–400)
RBC: 4.35 MIL/uL (ref 3.87–5.11)
RDW: 19.3 % — ABNORMAL HIGH (ref 11.5–15.5)
WBC: 6.2 10*3/uL (ref 4.0–10.5)
nRBC: 0 % (ref 0.0–0.2)

## 2020-02-29 LAB — RAPID HIV SCREEN (HIV 1/2 AB+AG)
HIV 1/2 Antibodies: NONREACTIVE
HIV-1 P24 Antigen - HIV24: NONREACTIVE

## 2020-02-29 LAB — SEDIMENTATION RATE: Sed Rate: 35 mm/hr — ABNORMAL HIGH (ref 0–20)

## 2020-02-29 MED ORDER — CLOPIDOGREL BISULFATE 75 MG PO TABS
75.0000 mg | ORAL_TABLET | Freq: Every day | ORAL | Status: DC
  Administered 2020-02-29 – 2020-03-02 (×3): 75 mg via ORAL
  Filled 2020-02-29 (×2): qty 1

## 2020-02-29 MED ORDER — ALBUTEROL SULFATE HFA 108 (90 BASE) MCG/ACT IN AERS
2.0000 | INHALATION_SPRAY | RESPIRATORY_TRACT | Status: DC | PRN
  Filled 2020-02-29: qty 6.7

## 2020-02-29 MED ORDER — ACETAMINOPHEN 160 MG/5ML PO SOLN
650.0000 mg | ORAL | Status: DC | PRN
  Filled 2020-02-29: qty 20.3

## 2020-02-29 MED ORDER — IOHEXOL 350 MG/ML SOLN
75.0000 mL | Freq: Once | INTRAVENOUS | Status: AC | PRN
  Administered 2020-02-29: 75 mL via INTRAVENOUS
  Filled 2020-02-29: qty 75

## 2020-02-29 MED ORDER — ATORVASTATIN CALCIUM 20 MG PO TABS
80.0000 mg | ORAL_TABLET | Freq: Every day | ORAL | Status: DC
  Administered 2020-03-01: 11:00:00 80 mg via ORAL
  Filled 2020-02-29: qty 4

## 2020-02-29 MED ORDER — ACETAMINOPHEN 325 MG PO TABS
650.0000 mg | ORAL_TABLET | ORAL | Status: DC | PRN
  Administered 2020-03-02: 650 mg via ORAL
  Filled 2020-02-29: qty 2

## 2020-02-29 MED ORDER — CLOPIDOGREL BISULFATE 75 MG PO TABS
75.0000 mg | ORAL_TABLET | ORAL | Status: AC
  Filled 2020-02-29: qty 1

## 2020-02-29 MED ORDER — ASPIRIN 81 MG PO CHEW
81.0000 mg | CHEWABLE_TABLET | Freq: Every day | ORAL | Status: DC
Start: 2020-02-29 — End: 2020-03-02
  Administered 2020-02-29 – 2020-03-02 (×3): 81 mg via ORAL
  Filled 2020-02-29 (×2): qty 1

## 2020-02-29 MED ORDER — NICOTINE 7 MG/24HR TD PT24
7.0000 mg | MEDICATED_PATCH | Freq: Every day | TRANSDERMAL | Status: DC
  Administered 2020-03-01 – 2020-03-02 (×3): 7 mg via TRANSDERMAL
  Filled 2020-02-29 (×4): qty 1

## 2020-02-29 MED ORDER — ASPIRIN 300 MG RE SUPP: 300.0000 mg | Freq: Every day | RECTAL | Status: DC

## 2020-02-29 MED ORDER — ACETAMINOPHEN 650 MG RE SUPP: 650.0000 mg | RECTAL | Status: DC | PRN

## 2020-02-29 MED ORDER — STROKE: EARLY STAGES OF RECOVERY BOOK: Freq: Once | Status: DC

## 2020-02-29 MED ORDER — ASPIRIN 81 MG PO CHEW
81.0000 mg | CHEWABLE_TABLET | ORAL | Status: AC
  Filled 2020-02-29: qty 1
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
## 2020-02-29 NOTE — ED Provider Notes (Signed)
[facility] Emergency Department Provider Note   ____________________________________________    I have reviewed the triage vital signs and the nursing notes.   HISTORY  Chief Complaint Arm Pain     HPI Crystal Benitez is a 39 y.o. female who presents with left arm weakness.  Patient reports she fell asleep last night at 930 with her arm underneath her as she usually does, denies drugs or alcohol, woke up at 1230 and was unable to move her left arm.  This has not changed and she still remains unable to move her left arm, she cannot make a fist, nor AB duct nor flex or extend.  Denies headache, no other neuro deficits.  No change in vision.  No lower extremity weakness, no history of the same  History reviewed. No pertinent past medical history.  There are no problems to display for this patient.   Past Surgical History:  Procedure Laterality Date  . CHOLECYSTECTOMY    . TUBAL LIGATION      Prior to Admission medications   Medication Sig Start Date End Date Taking? Authorizing Provider  albuterol (PROVENTIL HFA;VENTOLIN HFA) 108 (90 BASE) MCG/ACT inhaler Inhale 2 puffs into the lungs every 4 (four) hours as needed for wheezing. Patient not taking: Reported on 04/27/2016 09/27/12   Gilda Crease, MD  azithromycin (ZITHROMAX Z-PAK) 250 MG tablet Take 1 tablet (250 mg total) by mouth daily. 500mg  PO day 1, then 250mg  PO days 205 Patient not taking: Reported on 02/17/2014 01/17/13   02/19/2014, PA-C  HYDROcodone-acetaminophen (NORCO) 5-325 MG tablet Take 1-2 tablets by mouth every 6 (six) hours as needed. Patient not taking: Reported on 04/27/2016 09/05/15   06/25/2016, MD  ibuprofen (ADVIL,MOTRIN) 600 MG tablet Take 1 tablet (600 mg total) by mouth every 8 (eight) hours as needed. 04/27/16   Geoffery Lyons, MD  ondansetron (ZOFRAN) 4 MG tablet Take 1 tablet (4 mg total) by mouth every 6 (six) hours. Patient not taking: Reported on 04/27/2016  02/17/14   06/25/2016, PA-C     Allergies Patient has no known allergies.  History reviewed. No pertinent family history.  Social History Social History   Tobacco Use  . Smoking status: Current Every Day Smoker    Packs/day: 0.50    Types: Cigarettes  . Smokeless tobacco: Never Used  Substance Use Topics  . Alcohol use: Yes    Comment: occasional   . Drug use: No    Review of Systems  Constitutional: No fever/chills Eyes: No visual changes.  ENT: No sore throat. Cardiovascular: Denies chest pain. Respiratory: Denies shortness of breath. Gastrointestinal: No abdominal pain.  No nausea, no vomiting.   Genitourinary: Negative for dysuria. Musculoskeletal: Negative for back pain. Skin: Negative for rash. Neurological: As above   ____________________________________________   PHYSICAL EXAM:  VITAL SIGNS: ED Triage Vitals  Enc Vitals Group     BP 02/29/20 1303 (!) 141/92     Pulse Rate 02/29/20 1303 (!) 105     Resp 02/29/20 1303 18     Temp 02/29/20 1303 98.3 F (36.8 C)     Temp Source 02/29/20 1303 Oral     SpO2 02/29/20 1303 100 %     Weight 02/29/20 1304 85 kg (187 lb 6.3 oz)     Height 02/29/20 1304 1.676 m (5\' 6" )     Head Circumference --      Peak Flow --      Pain Score 02/29/20 1304 0  Pain Loc --      Pain Edu? --      Excl. in GC? --     Constitutional: Alert and oriented.   Nose: No congestion/rhinnorhea. Mouth/Throat: Mucous membranes are moist.   Neck:  Painless ROM Cardiovascular: Normal rate, regular rhythm. Grossly normal heart sounds.  Good peripheral circulation. Respiratory: Normal respiratory effort.  No retractions. Lungs CTAB. Gastrointestinal: Soft and nontender. No distention.  No CVA tenderness. Genitourinary: deferred Musculoskeletal: No lower extremity tenderness nor edema.  Warm and well perfused Neurologic: Normal speech and language, right arm strength is normal, lower extremity strength is normal, no facial  droop, cranial nerves II through XII are normal, left arm is weak, she is unable to resist gravity, unable to make a fist, unable to extend or flex at the elbow, unable to abduct at the shoulder Skin:  Skin is warm, dry and intact. No rash noted. Psychiatric: Mood and affect are normal. Speech and behavior are normal.  ____________________________________________   LABS (all labs ordered are listed, but only abnormal results are displayed)  Labs Reviewed  CBC - Abnormal; Notable for the following components:      Result Value   Hemoglobin 10.0 (*)    HCT 31.7 (*)    MCV 72.9 (*)    MCH 23.0 (*)    RDW 19.3 (*)    All other components within normal limits  COMPREHENSIVE METABOLIC PANEL - Abnormal; Notable for the following components:   Potassium 3.4 (*)    Glucose, Bld 116 (*)    All other components within normal limits   ____________________________________________  EKG   ____________________________________________  RADIOLOGY  CT angiography head and neck MR brain ____________________________________________   PROCEDURES  Procedure(s) performed: No  Procedures   Critical Care performed: No ____________________________________________   INITIAL IMPRESSION / ASSESSMENT AND PLAN / ED COURSE  Pertinent labs & imaging results that were available during my care of the patient were reviewed by me and considered in my medical decision making (see chart for details).  Patient presents with left arm weakness, suspicious for nerve palsy, also the differential is CVA  Discussed with neurology Dr. Ezzie Dural who will kindly consult on the patient.  He recommends CT angio head and neck, MRI brain and cervical spine  I have asked Dr. Scotty Court to follow-up on the results and disposition as appropriate    ____________________________________________   FINAL CLINICAL IMPRESSION(S) / ED DIAGNOSES  Final diagnoses:  Nerve palsy        Note:  This document was prepared  using Dragon voice recognition software and may include unintentional dictation errors.   Jene Every, MD 02/29/20 [phone]

## 2020-02-29 NOTE — ED Notes (Signed)
At MRI 

## 2020-02-29 NOTE — ED Triage Notes (Signed)
Pt in via EMS from home with c/o pain in her left neck and arm and hand. Pt describes the pain as numbness after sleeping on it last pm. Pt also with small abrasion to left hand and some redness and slight swelling. Pt also with chronic pain to left neck and arm.

## 2020-02-29 NOTE — ED Notes (Signed)
ED Provider at bedside. 

## 2020-02-29 NOTE — Progress Notes (Signed)
   Chaplain On-Call responded to Code Stroke notification. Patient was returning to ED-46 following CT scan.  Patient declined visit with Chaplain at this time.  Chaplain is available for spiritual support as needed.  Chaplain Tameia Rafferty B. Antoinett Dorman M.Div., [facility]

## 2020-02-29 NOTE — ED Notes (Signed)
Blood draw from right AC.  covid swabbed.  Taken to [facility] by their staff.  She is calm and feels she is okay to do mri now.

## 2020-02-29 NOTE — ED Provider Notes (Signed)
Procedures    ----------------------------------------- 4:10 PM on 02/29/2020 ----------------------------------------- Neurology requested activation of code stroke to expedite work-up with CT angiograms.  This has been completed and read, revealing right MCA infarct.  Patient does not have imaging findings to suggest LVEF, does not have symptoms of LVO.  Neurology recommends ASA 81 and plavix 75 for antiplatelet therapy for now.  Case d/w hospitalist for further management.    Sharman Cheek, MD 02/29/20 [phone]

## 2020-02-29 NOTE — Progress Notes (Signed)
PHARMACIST CODE STROKE RESPONSE  Code stroke paged At 1526 from ED.  tPA was not ordered or requested.  No further actions required for pharmacy involvement.  Crystal Benitez 02/29/20 4:11 PM

## 2020-02-29 NOTE — ED Notes (Signed)
Pt requesting food/drink.  Admitting notified of pt pass on swallow screen. Diet order requested.

## 2020-02-29 NOTE — ED Notes (Signed)
Patient assisted to restroom at this time. Pt able to ambulate independently in hallway.

## 2020-02-29 NOTE — H&P (Signed)
History and Physical    Crystal Benitez:[id number] DOB: 1980/06/30 DOA: 02/29/2020  PCP: Dolan Amen, FNP   Patient coming from: Home  I have personally briefly reviewed patient's old medical records in [facility]  Chief Complaint: Left arm weakness  HPI: Crystal Benitez is a 39 y.o. female with medical history significant for nicotine dependence who presents to the emergency room by EMS for evaluation of left arm weakness/numbness and tingling.  Patient arrived emergency room at about 12:48 PM.  Her last known well was at 9:30 PM and she fell asleep on her left arm which she usually does.  She denies any alcohol or drug use.  She woke up at about 1230 and was unable to move her left arm and states that this has not changed since then.  She denies having any headache, no blurry vision, no lower extremity weakness, no difficulty swallowing.  Due to the persistence of her symptoms her mother called EMS and she was brought into the emergency room where a code stroke was called. She denies having any chest pain, no shortness of breath, no nausea, no vomiting, no dizziness, no lightheadedness, no abdominal pain or any changes in her bowel habits, no fever, no chills, no cough. Labs show sodium 139, potassium 3.4, chloride 105, bicarb 23, glucose 116, BUN 8, creatinine 0.50, calcium 10.2, alkaline phosphatase 65, albumin 3.6, AST 20, ALT 15, total protein 7.5, white count 6.2, hemoglobin 10.0, hematocrit 31.7, MCV 72.9, RDW 19.3, platelet count 394 Respiratory viral panel is pending at the time of this history and physical CT scan of the head done without contrast shows patchy cytotoxic edema in the posterior right MCA territory.  No associated hemorrhage or mass-effect.  Mild additionally symmetric white matter hypodensity on the right. CT angiogram of the head and neck is negative for large vessel occlusion.  Positive for asymmetric irregularity of the supraclinoid right ICA and right MCA  origin.  Associated significant stenosis of right MCA branch occlusion identified.  Moderate stenosis of the nondominant right vertebral artery V2 segment.    ED Course: Patient is a 39 year old female with a past medical history significant for nicotine dependence who presents for evaluation of left arm weakness and is noted to have an acute stroke.  She will be admitted to the hospital for further evaluation.  Review of Systems: As per HPI otherwise 10 point review of systems negative.    History reviewed. No pertinent past medical history.  Past Surgical History:  Procedure Laterality Date  . CHOLECYSTECTOMY    . TUBAL LIGATION       reports that she has been smoking cigarettes. She has been smoking about 0.50 packs per day. She has never used smokeless tobacco. She reports current alcohol use. She reports that she does not use drugs.  No Known Allergies  History reviewed. No pertinent family history.   Prior to Admission medications   Medication Sig Start Date End Date Taking? Authorizing Provider  albuterol (PROVENTIL HFA;VENTOLIN HFA) 108 (90 BASE) MCG/ACT inhaler Inhale 2 puffs into the lungs every 4 (four) hours as needed for wheezing. Patient not taking: Reported on 04/27/2016 09/27/12   Gilda Crease, MD  azithromycin (ZITHROMAX Z-PAK) 250 MG tablet Take 1 tablet (250 mg total) by mouth daily.  PO day 1, then  PO days 205 Patient not taking: Reported on 02/17/2014 01/17/13   Roxy Horseman, PA-C  HYDROcodone-acetaminophen (NORCO) 5-325 MG tablet Take 1-2 tablets by mouth every 6 (six) hours  as needed. Patient not taking: Reported on 04/27/2016 09/05/15   Geoffery Lyons, MD  ibuprofen (ADVIL,MOTRIN) 600 MG tablet Take 1 tablet (600 mg total) by mouth every 8 (eight) hours as needed. 04/27/16   Zadie Rhine, MD  ondansetron (ZOFRAN) 4 MG tablet Take 1 tablet (4 mg total) by mouth every 6 (six) hours. Patient not taking: Reported on 04/27/2016 02/17/14   Oswaldo Conroy, PA-C    Physical Exam: Vitals:   02/29/20 1303 02/29/20 1304 02/29/20 1553  BP: (!) 141/92  (!) 142/101  Pulse: (!) 105  94  Resp: 18  16  Temp: 98.3 F (36.8 C)    TempSrc: Oral    SpO2: 100%  100%  Weight:  85 kg   Height:  5\' 6"  (1.676 m)      Vitals:   02/29/20 1303 02/29/20 1304 02/29/20 1553  BP: (!) 141/92  (!) 142/101  Pulse: (!) 105  94  Resp: 18  16  Temp: 98.3 F (36.8 C)    TempSrc: Oral    SpO2: 100%  100%  Weight:  85 kg   Height:  5\' 6"  (1.676 m)     Constitutional: NAD, alert and oriented x 3 Eyes: PERRL, lids and conjunctivae normal ENMT: Mucous membranes are moist.  Neck: normal, supple, no masses, no thyromegaly Respiratory: clear to auscultation bilaterally, no wheezing, no crackles. Normal respiratory effort. No accessory muscle use.  Cardiovascular: Regular rate and rhythm, no murmurs / rubs / gallops. No extremity edema. 2+ pedal pulses. No carotid bruits.  Abdomen: no tenderness, no masses palpated. No hepatosplenomegaly. Bowel sounds positive.  Musculoskeletal: no clubbing / cyanosis. No joint deformity upper and lower extremities.  Skin: no rashes, lesions, ulcers.  Neurologic: Left arm weakness. Strength in the left arm is about 1-2/5 Psychiatric: Normal mood and affect.   Labs on Admission: I have personally reviewed following labs and imaging studies  CBC: Recent Labs  Lab 02/29/20 1308  WBC 6.2  HGB 10.0*  HCT 31.7*  MCV 72.9*  PLT 394   Basic Metabolic Panel: Recent Labs  Lab 02/29/20 1308  NA 139  K 3.4*  CL 105  CO2 23  GLUCOSE 116*  BUN 8  CREATININE 0.50  CALCIUM 10.2   GFR: Estimated Creatinine Clearance: 101.7 mL/min (by C-G formula based on SCr of 0.5 mg/dL). Liver Function Tests: Recent Labs  Lab 02/29/20 1308  AST 20  ALT 15  ALKPHOS 65  BILITOT 0.5  PROT 7.5  ALBUMIN 3.6   No results for input(s): LIPASE, AMYLASE in the last 168 hours. No results for input(s): AMMONIA in the last 168  hours. Coagulation Profile: No results for input(s): INR, PROTIME in the last 168 hours. Cardiac Enzymes: No results for input(s): CKTOTAL, CKMB, CKMBINDEX, TROPONINI in the last 168 hours. BNP (last 3 results) No results for input(s): PROBNP in the last 8760 hours. HbA1C: No results for input(s): HGBA1C in the last 72 hours. CBG: No results for input(s): GLUCAP in the last 168 hours. Lipid Profile: No results for input(s): CHOL, HDL, LDLCALC, TRIG, CHOLHDL, LDLDIRECT in the last 72 hours. Thyroid Function Tests: No results for input(s): TSH, T4TOTAL, FREET4, T3FREE, THYROIDAB in the last 72 hours. Anemia Panel: No results for input(s): VITAMINB12, FOLATE, FERRITIN, TIBC, IRON, RETICCTPCT in the last 72 hours. Urine analysis:    Component Value Date/Time   COLORURINE YELLOW 02/17/2014 0838   APPEARANCEUR CLEAR 02/17/2014 0838   LABSPEC 1.005 02/17/2014 0838   PHURINE 5.5 02/17/2014 02/19/2014  GLUCOSEU NEGATIVE 02/17/2014 0838   HGBUR LARGE (A) 02/17/2014 0838   BILIRUBINUR NEGATIVE 02/17/2014 0838   KETONESUR NEGATIVE 02/17/2014 0838   PROTEINUR NEGATIVE 02/17/2014 0838   UROBILINOGEN 0.2 02/17/2014 0838   NITRITE NEGATIVE 02/17/2014 0838   LEUKOCYTESUR NEGATIVE 02/17/2014 0838    Radiological Exams on Admission: CT HEAD CODE STROKE WO CONTRAST  Result Date: 02/29/2020 CLINICAL DATA:  Code stroke. 39 year old female with pain and numbness radiating from the left neck to the arm and hand. EXAM: CT HEAD WITHOUT CONTRAST TECHNIQUE: Contiguous axial images were obtained from the base of the skull through the vertex without intravenous contrast. COMPARISON:  None. FINDINGS: Brain: Patchy cytotoxic edema right posterior frontal lobe and parietal lobe, right middle and superior frontal gyri. Perirolandic cortex involvement. Pre motor and postcentral involvement also. No associated hemorrhage. No significant mass effect. Superimposed mild additional asymmetric white matter hypodensity in  both hemispheres. Elsewhere gray-white matter differentiation within normal limits. No chronic cortical encephalomalacia identified. No acute intracranial hemorrhage identified. No ventriculomegaly. No midline shift, mass effect, or evidence of intracranial mass lesion. Vascular: No suspicious intracranial vascular hyperdensity. Skull: Negative. Sinuses/Orbits: Visualized paranasal sinuses and mastoids are clear. Other: Mildly Disconjugate gaze. Otherwise negative orbit and scalp soft tissues. ASPECTS [facility] Stroke Program Early CT Score) Total score (0-10 with 10 being normal): 8 (abnormal right M 5 and M6 segments IMPRESSION: 1. Patchy cytotoxic edema in the posterior right MCA territory. ASPECTS 8. 2. No associated hemorrhage or mass effect. Mild additional asymmetric white matter hypodensity on the right. 3. These results were communicated to Dr. Derry Lory at 3:44 pm on 02/29/2020 by text page via the [facility] messaging system. Electronically Signed   By: Odessa Fleming M.D.   On: 02/29/2020 15:45   CT ANGIO HEAD CODE STROKE  Result Date: 02/29/2020 CLINICAL DATA:  39 year old female code stroke presentation. Left upper extremity pain and numbness. EXAM: CT ANGIOGRAPHY HEAD AND NECK TECHNIQUE: Multidetector CT imaging of the head and neck was performed using the standard protocol during bolus administration of intravenous contrast. Multiplanar CT image reconstructions and MIPs were obtained to evaluate the vascular anatomy. Carotid stenosis measurements (when applicable) are obtained utilizing NASCET criteria, using the distal internal carotid diameter as the denominator. CONTRAST:  64mL OMNIPAQUE IOHEXOL 350 MG/ML SOLN COMPARISON:  Plain head CT 1535 hours today. FINDINGS: CTA NECK Skeleton: Scattered carious dentition. Chronic C5-C6 disc and endplate degeneration. No acute osseous abnormality identified. Upper chest: Mild paraseptal emphysema in the lung apices. Other neck: Within normal limits. Aortic arch: 3  vessel arch configuration.  No arch atherosclerosis. Right carotid system: Negative. Left carotid system: Negative aside from mild tortuosity. Vertebral arteries: Negative proximal right subclavian artery and right vertebral artery origin. Proximal right V2 segment soft plaque and moderate stenosis suspected on series 5, image 459. Otherwise the right vertebral is patent to the skull base and within normal limits. Proximal left subclavian artery and origin of the mildly dominant left vertebral arteries are normal. The left vertebral is patent and normal to the skull base. CTA HEAD Posterior circulation: Mildly dominant left V4 segment. Patent distal vertebral arteries to the vertebrobasilar junction without plaque or stenosis. Normal right PICA origin. Dominant appearing left AICA. Patent basilar artery without stenosis. SCA and PCA origins are patent. Posterior communicating arteries are diminutive or absent. Bilateral PCA branches are within normal limits. Anterior circulation: Both ICA siphons are patent. The left siphon is normal. The right siphon appears smaller, remains patent to the terminus, but with mild  supraclinoid segment irregularity (series 8, image 142. Irregularity continues to the right MCA origin which is patent (series 10, image 18). The right MCA M1 segment remains patent without significant stenosis. Right MCA bifurcation is patent. Right MCA branches are within normal limits. No branch occlusion identified. Left ACA A1 segment is dominant, and there is azygos type ACA anatomy from the left. Anterior communicating artery and ACA branches appear within normal limits. Left MCA origin and M1 segment appear more normal than the right. Left MCA bifurcation is patent. Left MCA branches are within normal limits. Venous sinuses: Patent. Dominant right transverse and sigmoid sinuses. Anatomic variants: Mildly dominant left vertebral artery. Dominant right transverse and sigmoid sinuses. Left ACA A1  segment is dominant, and there is azygos type ACA anatomy from the left. Review of the MIP images confirms the above findings IMPRESSION: 1. Negative for large vessel occlusion. Positive for asymmetric irregularity of the supraclinoid right ICA and right MCA origin. Associated significant stenosis or right MCA branch occlusion identified. Preliminary results of the above discussed by telephone with Dr. Erick Blinks on 02/29/2020 at 1549 hours. 2. 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 But no other atherosclerosis or arterial abnormality identified in the head or neck. Electronically Signed   By: Odessa Fleming M.D.   On: 02/29/2020 16:29   CT ANGIO NECK CODE STROKE  Result Date: 02/29/2020 CLINICAL DATA:  39 year old female code stroke presentation. Left upper extremity pain and numbness. EXAM: CT ANGIOGRAPHY HEAD AND NECK TECHNIQUE: Multidetector CT imaging of the head and neck was performed using the standard protocol during bolus administration of intravenous contrast. Multiplanar CT image reconstructions and MIPs were obtained to evaluate the vascular anatomy. Carotid stenosis measurements (when applicable) are obtained utilizing NASCET criteria, using the distal internal carotid diameter as the denominator. CONTRAST:  55mL OMNIPAQUE IOHEXOL 350 MG/ML SOLN COMPARISON:  Plain head CT 1535 hours today. FINDINGS: CTA NECK Skeleton: Scattered carious dentition. Chronic C5-C6 disc and endplate degeneration. No acute osseous abnormality identified. Upper chest: Mild paraseptal emphysema in the lung apices. Other neck: Within normal limits. Aortic arch: 3 vessel arch configuration.  No arch atherosclerosis. Right carotid system: Negative. Left carotid system: Negative aside from mild tortuosity. Vertebral arteries: Negative proximal right subclavian artery and right vertebral artery origin. Proximal right V2 segment soft plaque and moderate stenosis suspected on series 5, image 459.  Otherwise the right vertebral is patent to the skull base and within normal limits. Proximal left subclavian artery and origin of the mildly dominant left vertebral arteries are normal. The left vertebral is patent and normal to the skull base. CTA HEAD Posterior circulation: Mildly dominant left V4 segment. Patent distal vertebral arteries to the vertebrobasilar junction without plaque or stenosis. Normal right PICA origin. Dominant appearing left AICA. Patent basilar artery without stenosis. SCA and PCA origins are patent. Posterior communicating arteries are diminutive or absent. Bilateral PCA branches are within normal limits. Anterior circulation: Both ICA siphons are patent. The left siphon is normal. The right siphon appears smaller, remains patent to the terminus, but with mild supraclinoid segment irregularity (series 8, image 142. Irregularity continues to the right MCA origin which is patent (series 10, image 18). The right MCA M1 segment remains patent without significant stenosis. Right MCA bifurcation is patent. Right MCA branches are within normal limits. No branch occlusion identified. Left ACA A1 segment is dominant, and there is azygos type ACA anatomy from the left. Anterior communicating artery and ACA branches appear  within normal limits. Left MCA origin and M1 segment appear more normal than the right. Left MCA bifurcation is patent. Left MCA branches are within normal limits. Venous sinuses: Patent. Dominant right transverse and sigmoid sinuses. Anatomic variants: Mildly dominant left vertebral artery. Dominant right transverse and sigmoid sinuses. Left ACA A1 segment is dominant, and there is azygos type ACA anatomy from the left. Review of the MIP images confirms the above findings IMPRESSION: 1. Negative for large vessel occlusion. Positive for asymmetric irregularity of the supraclinoid right ICA and right MCA origin. Associated significant stenosis or right MCA branch occlusion  identified. Preliminary results of the above discussed by telephone with Dr. Erick BlinksSALMAN KHALIQDINA on 02/29/2020 at 1549 hours. 2. 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 But no other atherosclerosis or arterial abnormality identified in the head or neck. Electronically Signed   By: Odessa FlemingH  Hall M.D.   On: 02/29/2020 16:29    EKG: Independently reviewed.    Assessment/Plan Active Problems:   Acute CVA (cerebrovascular accident) (HCC)   Nicotine dependence     Acute CVA Patient presents for evaluation of left arm weakness and is noted to have an acute stroke involving the right MCA We will place patient on aspirin 81 mg daily and Plavix 75 mg daily Obtain 2D echocardiogram to assess LVEF and rule out cardiac thrombus We will request PT/ST/OT evaluation Consult neurology Allow for permissive hypertension Place patient on high intensity statins    Nicotine dependence Smoking cessation was discussed with patient in detail We will place patient on nicotine transdermal patch 7 mg daily   DVT prophylaxis: SCD Code Status: Full code Family Communication: Greater than 50% of time was spent discussing plan of care with patient at the bedside.  All questions and concerns have been addressed.  She verbalizes understanding and agrees with plan. Disposition Plan: Back to previous home environment Consults called: Neurology    Lucile Shuttersochukwu Eshaan Titzer MD [facility]     02/29/2020, 4:55 PM

## 2020-03-01 ENCOUNTER — Inpatient Hospital Stay (HOSPITAL_COMMUNITY)
Admit: 2020-03-01 | Discharge: 2020-03-01 | Disposition: A | Discharge status: Home / Self Care | Payer: Medicaid Other | Attending: Neurology | Admitting: Neurology

## 2020-03-01 DIAGNOSIS — I5189 Other ill-defined heart diseases: Secondary | ICD-10-CM

## 2020-03-01 DIAGNOSIS — G589 Mononeuropathy, unspecified: Secondary | ICD-10-CM

## 2020-03-01 DIAGNOSIS — I6389 Other cerebral infarction: Secondary | ICD-10-CM

## 2020-03-01 HISTORY — DX: Other ill-defined heart diseases: I51.89

## 2020-03-01 LAB — ECHOCARDIOGRAM COMPLETE BUBBLE STUDY
AR max vel: 2.43 cm2
AV Peak grad: 8.6 mmHg
Ao pk vel: 1.47 m/s
Area-P 1/2: 5.16 cm2
S' Lateral: 3.3 cm

## 2020-03-01 LAB — URINE DRUG SCREEN, QUALITATIVE (ARMC ONLY)
Amphetamines, Ur Screen: NOT DETECTED
Barbiturates, Ur Screen: NOT DETECTED
Benzodiazepine, Ur Scrn: NOT DETECTED
Cannabinoid 50 Ng, Ur ~~LOC~~: NOT DETECTED
Cocaine Metabolite,Ur ~~LOC~~: NOT DETECTED
MDMA (Ecstasy)Ur Screen: NOT DETECTED
Methadone Scn, Ur: NOT DETECTED
Opiate, Ur Screen: NOT DETECTED
Phencyclidine (PCP) Ur S: NOT DETECTED
Tricyclic, Ur Screen: NOT DETECTED

## 2020-03-01 LAB — LDL CHOLESTEROL, DIRECT: Direct LDL: 69.7 mg/dL (ref 0–99)

## 2020-03-01 LAB — HEMOGLOBIN A1C
Hgb A1c MFr Bld: 5.7 % — ABNORMAL HIGH (ref 4.8–5.6)
Hgb A1c MFr Bld: 5.5 % (ref 4.8–5.6)
Mean Plasma Glucose: 116.89 mg/dL
Mean Plasma Glucose: 111.15 mg/dL

## 2020-03-01 LAB — LIPID PANEL
Cholesterol: 135 mg/dL (ref 0–200)
HDL: 57 mg/dL (ref >40)
LDL Cholesterol: 55 mg/dL (ref 0–99)
Total CHOL/HDL Ratio: 2.4 RATIO
Triglycerides: 115 mg/dL (ref <150)
VLDL: 23 mg/dL (ref 0–40)

## 2020-03-01 LAB — C-REACTIVE PROTEIN: CRP: 0.6 mg/dL (ref <1.0)

## 2020-03-01 NOTE — Progress Notes (Addendum)
NEUROLOGY CONSULTATION PROGRESS NOTE   Date of service: March 01, 2020 Patient Name: Crystal Benitez MRN:  [id number] DOB:  [birth year]  Brief HPI  Crystal Benitez is a 39 y.o. female with PMH significant for smoking who presents with patchy R posterior MCA stroke and L hand weakness.   Interval Hx   No acute events overnight. LUE weakness is improved today and she is happy that it is getting better.  Vitals   Vitals:   03/01/20 0029 03/01/20 0229 03/01/20 0457 03/01/20 0724  BP: 127/85 137/89 111/76 123/78  Pulse: 75 89 92 79  Resp: '20 18  18  ' Temp: 98.7 F (37.1 C) 98.7 F (37.1 C) 97.8 F (36.6 C) 97.7 F (36.5 C)  TempSrc: Oral Oral    SpO2: 100% 100% 99% 100%  Weight: 73.3 kg     Height: '5\' 6"'  (1.676 m)        Body mass index is 26.08 kg/m.  Physical Exam   General: Laying comfortably in bed; in no acute distress.  HENT: Normal oropharynx and mucosa. Normal external appearance of ears and nose.  Neck: Supple, no pain or tenderness  CV: No JVD. No peripheral edema.  Pulmonary: Symmetric Chest rise. Normal respiratory effort.  Abdomen: Soft to touch, non-tender.  Ext: No cyanosis, edema, or deformity  Skin: No rash. Normal palpation of skin.   Musculoskeletal: Normal digits and nails by inspection. No clubbing.   Neurologic Examination  Mental status/Cognition: Alert, oriented to self, place, month and year, good attention.  Speech/language: Fluent, comprehension intact, object naming intact, repetition intact.  Cranial nerves:   CN II Pupils equal and reactive to light, no VF deficits    CN III,IV,VI EOM intact, no gaze preference or deviation, no nystagmus    CN V normal sensation in V1, V2, and V3 segments bilaterally    CN VII no asymmetry, no nasolabial fold flattening    CN VIII normal hearing to speech    CN IX & X normal palatal elevation, no uvular deviation    CN XI 5/5 head turn and 5/5 shoulder shrug bilaterally    CN XII midline tongue protrusion     Motor:  Muscle bulk: normal, tone hypotonia in LUE, rest normal,  tremor none Mvmt Root Nerve  Muscle Right Left Comments  SA C5/6 Ax Deltoid 5 2   EF C5/6 Mc Biceps 5 3   EE C6/7/8 Rad Triceps 5 4   WF C6/7 Med FCR 5 4   WE C7/8 PIN ECU 5 4   F Ab C8/T1 U ADM/FDI 5 4   HF L1/2/3 Fem Illopsoas 5 5   KE L2/3/4 Fem Quad 5 5   DF L4/5 D Peron Tib Ant 5 5   PF S1/2 Tibial Grc/Sol 5 5    Reflexes:  Right Left Comments  Pectoralis      Biceps (C5/6) 1 1   Brachioradialis (C5/6) 1 1    Triceps (C6/7) 1 1    Patellar (L3/4) 1 1    Achilles (S1)      Hoffman      Plantar     Jaw jerk    Sensation:  Light touch Intact throughout   Pin prick    Temperature    Vibration   Proprioception    Coordination/Complex Motor:  - Finger to Nose intact on Right - Heel to shin intact BL - Rapid alternating movement are normal   Labs   Basic Metabolic Panel:  Lab Results  Component Value Date   NA 139 02/29/2020   K 3.4 (L) 02/29/2020   CO2 23 02/29/2020   GLUCOSE 116 (H) 02/29/2020   BUN 8 02/29/2020   CREATININE 0.50 02/29/2020   CALCIUM 10.2 02/29/2020   GFRNONAA >60 02/29/2020   GFRAA >60 04/27/2016   HbA1c:  Lab Results  Component Value Date   HGBA1C 5.5 03/01/2020   LDL:  Lab Results  Component Value Date   LDLCALC 55 03/01/2020   Urine Drug Screen: No results found for: LABOPIA, COCAINSCRNUR, LABBENZ, AMPHETMU, THCU, LABBARB  Alcohol Level No results found for: ETH No results found for: PHENYTOIN, ZONISAMIDE, LAMOTRIGINE, LEVETIRACETA No results found for: PHENYTOIN, PHENOBARB, VALPROATE, CBMZ  Imaging and Diagnostic studies   CT Head without contrast: Demonstrates a posteroir R MCA ischemic stroke.  CT angio Head and Neck with contrast: No LVO, irregular supraclinoid R ICA and R MCA origin with R MCA stenosis.  MRI Brain: Scattered acute R MCA infarcts with cytotoxic edema and petechial hemorrhage. Contralateral L MCA territory scattered chronic  encephalomalacia.   Impression   Crystal Benitez is a 39 y.o. female with PMH significant for smoking who presents with patchy R posterior MCA stroke and L hand weakness. Given MRI demonstrating prior patchy L MCA encephalomalacia, findings highly suspicious for an embolic etiology.  Recommendations  - Frequent Neuro checks per stroke unit protocol - TTE with bubble study is pending, if negative for thrombus or PFO, would recommend TEE. - LDL of 55, I discontinued atorvastatin that I had started on her yesterday. - HbA1c of 5.7 - Aspirin 65m daily along with plavix 760mdaily for 3 weeks, followed by Aspirin 8139maily alone. - gradual normotension - Stroke education - PT/OT/SLP consulted - Urine Drug Screen is still not collected. Discussed with RN. - Hypercoagulable workup with normal CRP, mildily elevated ESR, rest of the studies are pending. - Extensively spoke to her about her risk of stroke recurrence with continued smoking and that she should quit. She is appreicative and understands the risk of continued smoking and will not smoke cigarettes at discharge. ____________________________________________________________________   Thank you for the opportunity to take part in the care of this patient. If you have any further questions, please contact the neurology consultation attending.  Signed,  SalMirandager Number [phone]

## 2020-03-01 NOTE — Evaluation (Signed)
Occupational Therapy Evaluation Patient Details Name: Crystal Benitez MRN: [id number] DOB: 12-Oct-1980 Today's Date: 03/01/2020    History of Present Illness Crystal Benitez is a 39 y.o. female with medical history significant for nicotine dependence who presents to the emergency room by EMS for evaluation of left arm weakness/numbness and tingling. MRI 02/29/20 : Positive for scattered acute infarcts in the Right MCA territory with cytotoxic edema and occasional petechial hemorrhage, but no malignant hemorrhagic transformation or mass effect.   Clinical Impression   Crystal Benitez was seen for OT evaluation this date. Prior to hospital admission, pt was Independent in mobility and I/ADLs. Pt lives c boyfriend and his roommate in home c 2 STE. Pt presents to acute OT demonstrating impaired ADL performance and functional mobility 2/2 nondominant LUE strength/coordination/sensation deficits and decreased activity tolerance.   Pt was Independent for bed mobility and toilet t/f. Achieves LUE thumb opposition 2nd/3rd digit, unable to achieve c 4th/5th digit. VCs for self-drinking at bed level - LUE assisting to grasp cup. SUPERVISION + VCs for bimanual integration hand washing standing sinkside. Don/doff R sock c RUE only seated EOB - unable to incorporate LUE.   Pt educated on safe positioning of LUE in order to promote functional return, improve safety, and promote skin integrity on this date. Pt was motivated t/o OT session and return demonstrated AAROM exercises with VCs for technique. No cognitive or visual deficits noted with assessment on this date - mild L negligence noted, will continue to assess in context of ADLs. Pt would benefit from skilled OT to address noted impairments and functional limitations (see below for any additional details) in order to maximize safety and independence while minimizing falls risk and caregiver burden.  Upon hospital discharge, recommend pt discharge to Outpatient OT to  maximize safety and return to PLOF.    Follow Up Recommendations  Outpatient OT;Supervision - Intermittent    Equipment Recommendations  None recommended by OT    Recommendations for Other Services       Precautions / Restrictions Precautions Precautions: None Restrictions Weight Bearing Restrictions: No      Mobility Bed Mobility Overal bed mobility: Independent                  Transfers Overall transfer level: Independent                    Balance Overall balance assessment: Mild deficits observed, not formally tested (mild L negligence noted)                                         ADL either performed or assessed with clinical judgement   ADL Overall ADL's : Needs assistance/impaired                                       General ADL Comments: SETUP self-feeding at bed level. VCs for self-drinking at bed level - LUE assisting to grasp cup. SUPERVISION + VCs for bimanual integration hand washing standing sinkside. Don/doff R sock c RUE only seated EOB - unable to incorporate LUE.     Vision   Additional Comments: mild L negligence, to be further assessed in context of ADLs            Pertinent Vitals/Pain Pain Assessment: No/denies pain  Hand Dominance Right   Extremity/Trunk Assessment Upper Extremity Assessment Upper Extremity Assessment: LUE deficits/detail LUE Deficits / Details: Grip 3/5, deltoid 3/5, bicep/tricep 2/5, wrist flexion/ext 2-/5. Achieves 2nd/3rd digit opposition c increased time LUE Sensation: decreased light touch LUE Coordination: decreased fine motor   Lower Extremity Assessment Lower Extremity Assessment: Overall WFL for tasks assessed       Communication Communication Communication: No difficulties   Cognition Arousal/Alertness: Awake/alert Behavior During Therapy: WFL for tasks assessed/performed Overall Cognitive Status: Within Functional Limits for tasks assessed                                      General Comments       Exercises Exercises: Other exercises;General Upper Extremity General Exercises - Upper Extremity Shoulder Flexion: AAROM;Left;5 reps;Seated Shoulder Extension: AAROM;Left;5 reps;Seated Elbow Flexion: AAROM;Left;5 reps;Seated Elbow Extension: AAROM;Left;5 reps;Seated Digit Composite Flexion: AAROM;Left;5 reps;Seated Composite Extension: AAROM;Left;5 reps;Seated Other Exercises Other Exercises: Pt educated re: OT role, d/c recs, falls prevention, edema mgmt, stroke education, neuro re-ed, HEP Other Exercises: LBD, toileting, UBD, self-eating/drinking, sup<sit, sit<>stand x2, in room ~20 ft mobility, sitting/standing balance/toelrance   Shoulder Instructions      Home Living Family/patient expects to be discharged to:: Private residence Living Arrangements: Other (Comment) (boyfriend and his roommmate) Available Help at Discharge: Friend(s);Available PRN/intermittently Type of Home: House Home Access: Stairs to enter [employer] of Steps: 2 Entrance Stairs-Rails: None Home Layout: One level     Bathroom Shower/Tub: Tub/shower unit                    Prior Functioning/Environment Level of Independence: Independent                 OT Problem List: Decreased strength;Decreased range of motion;Decreased activity tolerance;Impaired UE functional use      OT Treatment/Interventions: Self-care/ADL training;Therapeutic exercise;Neuromuscular education;Energy conservation;DME and/or AE instruction;Therapeutic activities;Patient/family education;Balance training    OT Goals(Current goals can be found in the care plan section) Acute Rehab OT Goals Patient Stated Goal: To return to PLOF OT Goal Formulation: With patient Time For Goal Achievement: 03/15/20 Potential to Achieve Goals: Good ADL Goals Pt Will Perform Grooming: Independently;standing Pt Will Perform Lower Body Dressing:  Independently;sit to/from stand Pt/caregiver will Perform Home Exercise Program: Increased ROM;Increased strength;Left upper extremity Additional ADL Goal #1: Pt will complete setup for grooming/eating c no cues to integrate LUE  OT Frequency: Min 2X/week   Barriers to D/C: Decreased caregiver support             AM-PAC OT "6 Clicks" Daily Activity     Outcome Measure Help from another person eating meals?: A Little Help from another person taking care of personal grooming?: A Little Help from another person toileting, which includes using toliet, bedpan, or urinal?: A Little Help from another person bathing (including washing, rinsing, drying)?: A Little Help from another person to put on and taking off regular upper body clothing?: A Little Help from another person to put on and taking off regular lower body clothing?: A Little 6 Click Score: 18   End of Session    Activity Tolerance: Patient tolerated treatment well Patient left: in bed;with call bell/phone within reach;with bed alarm set  OT Visit Diagnosis: Muscle weakness (generalized) (M62.81);Hemiplegia and hemiparesis Hemiplegia - Right/Left: Left Hemiplegia - dominant/non-dominant: Non-Dominant Hemiplegia - caused by: Nontraumatic intracerebral hemorrhage  Time: 8115-7262 OT Time Calculation (min): 23 min Charges:  OT General Charges $OT Visit: 1 Visit OT Evaluation $OT Eval Moderate Complexity: 1 Mod OT Treatments $Self Care/Home Management : 8-22 mins  Kathie Dike, M.S. OTR/L  03/01/20, 2:18 PM  ascom [phone]

## 2020-03-01 NOTE — Progress Notes (Signed)
*  PRELIMINARY RESULTS* Echocardiogram 2D Echocardiogram has been performed. A Bubble Study (Saline Microcavitation) was requested and performed on this study.  Garrel Ridgel Cebastian Neis 03/01/2020, 10:04 AM

## 2020-03-01 NOTE — Plan of Care (Signed)
  Problem: Education: Goal: Knowledge of General Education information will improve Description Including pain rating scale, medication(s)/side effects and non-pharmacologic comfort measures Outcome: Progressing   Problem: Health Behavior/Discharge Planning: Goal: Ability to manage health-related needs will improve Outcome: Progressing   Problem: Clinical Measurements: Goal: Ability to maintain clinical measurements within normal limits will improve Outcome: Progressing Goal: Will remain free from infection Outcome: Progressing Goal: Diagnostic test results will improve Outcome: Progressing Goal: Respiratory complications will improve Outcome: Progressing Goal: Cardiovascular complication will be avoided Outcome: Progressing   Problem: Activity: Goal: Risk for activity intolerance will decrease Outcome: Progressing   Problem: Nutrition: Goal: Adequate nutrition will be maintained Outcome: Progressing   Problem: Coping: Goal: Level of anxiety will decrease Outcome: Progressing   Problem: Elimination: Goal: Will not experience complications related to bowel motility Outcome: Progressing Goal: Will not experience complications related to urinary retention Outcome: Progressing   Problem: Pain Managment: Goal: General experience of comfort will improve Outcome: Progressing   Problem: Safety: Goal: Ability to remain free from injury will improve Outcome: Progressing   Problem: Skin Integrity: Goal: Risk for impaired skin integrity will decrease Outcome: Progressing   Problem: Health Behavior/Discharge Planning: Goal: Ability to manage health-related needs will improve Outcome: Progressing   

## 2020-03-01 NOTE — Progress Notes (Signed)
Brief Neuro update:  TTE with bubble study with EF of 60-65%is negative, no interatrial shunt on color doppler or with agitated saline. Discussed with primary team that we should pursue a TEE and make her NPO tonight.  Erick Blinks [facility] Pager Number [phone]

## 2020-03-01 NOTE — Progress Notes (Signed)
PROGRESS NOTE  Crystal Benitez QQI:[id number] DOB: 06-03-1980 DOA: 02/29/2020 PCP: Dolan Amen, FNP  Brief History   The patient is a 39 yr old woman who presented to [facility] ED 3 hr 18 minutes after her last known well status for evaluation of left arm weakness/numbness and tingling. The patient states that she awoke at 12:30 with the above symptoms.   CT head of the head was performed and demonstrated patchy cytotoxic edema in the posterior right MCA territory.   CTA head and neck demonstrated no large vessel occlusion, but did demonstrate irregularity of the supraclinoid right ICA and right MCA origin. There is also associated significant stenosis of right MCA branch occlusion identified as well as moderate stenosis of the nondominant right vertebral artery V2 segment.   MRI/MRA Brain was positive for scattered acute infarcts of the Right MCA territory with cytotoxic edema and occasional petechial hemorrhage, but no malignant hemorrhagic transformation or mass effect. There is also a contralateral posterior left MCA territory scattered chronic encephalomalacia with occasinoal chronic micro-hemorrhage. Radiology notes that this constellation of MRI and CTA findings is consistent with a recurrent ischemic mechanisms such as PFO or similar etiology.   The patient was not a candidate for tPA due to being outside the time window for this therapy. She was also not a candidate for thrombectomy due to no large vessel obstruction on CTA.  [facility] were consulted to admit the patient for further evaluation and treatment. Neurology was consulted. She has been started on aspirin and plavix.   Echocardiogram with bubble study is pending. Lipid panel demonstrates LDL of 55.  Consultants  . Neurology  Procedures  . None  Antibiotics   Anti-infectives (From admission, onward)   None    .  Subjective  The patient is resting comfortably. She is somewhat tearful. No new  complaints.  Objective   Vitals:  Vitals:   03/01/20 0724 03/01/20 1118  BP: 123/78 130/81  Pulse: 79 82  Resp: 18 18  Temp: 97.7 F (36.5 C) 97.8 F (36.6 C)  SpO2: 100% 100%   Exam:  Constitutional:  The patient is awake, alert, and oriented x 3. She is tearful.  Respiratory:  . No increased work of breathing. . No wheezes, rales, or rhonchi . No tactile fremitus Cardiovascular:  . Regular rate and rhythm . No murmurs, ectopy, or gallups. . No lateral PMI. No thrills. Abdomen:  . Abdomen is soft, non-tender, non-distended . No hernias, masses, or organomegaly . Normoactive bowel sounds.  Musculoskeletal:  . No cyanosis, clubbing, or edema Skin:  . No rashes, lesions, ulcers . palpation of skin: no induration or nodules Neurologic:  . CN 2-12 intact . Sensation all 4 extremities intact . Flaccid paralysis of left upper extremity. Psychiatric:  . Tearful. Depressed mood.  I have personally reviewed the following:   Today's Data  . Vitals, Lipid profile, CMP, CBC  Imaging  . CT head . CTA head and neck . MRI Brain  Cardiology Data  . Echocardiogram  Scheduled Meds: .  stroke: mapping our early stages of recovery book   Does not apply Once  . aspirin  81 mg Oral Daily   Or  . aspirin  300 mg Rectal Daily  . aspirin  81 mg Oral STAT  . clopidogrel  75 mg Oral Daily  . clopidogrel  75 mg Oral STAT  . nicotine  7 mg Transdermal Daily   Continuous Infusions:  Active Problems:   Acute CVA (cerebrovascular accident) (  HCC)   Nicotine dependence   LOS: 1 day   A & P  Acute CVA: CT head of the head was performed and demonstrated patchy cytotoxic edema in the posterior right MCA territory. CTA head and neck demonstrated no large vessel occlusion, but did demonstrate irregularity of the supraclinoid right ICA and right MCA origin. There is also associated significant stenosis of right MCA branch occlusion identified as well as moderate stenosis of the  nondominant right vertebral artery V2 segment. MRI/MRA Brain was positive for scattered acute infarcts of the Right MCA territory with cytotoxic edema and occasional petechial hemorrhage, but no malignant hemorrhagic transformation or mass effect. There is also a contralateral posterior left MCA territory scattered chronic encephalomalacia with occasinoal chronic micro-hemorrhage. Radiology notes that this constellation of MRI and CTA findings is consistent with a recurrent ischemic mechanisms such as PFO or similar etiology.  The patient was not a candidate for tPA due to being outside the time window for this therapy. She was also not a candidate for thrombectomy due to no large vessel obstruction on CTA. Neurology has been consulted. Echocardiogram with bubble study is pending. She has been started on ASA and plavix. PT/OT ordered.  Nicotine dependence: Tobacco cessation counseling provided.   Hypertension: Elevated BP upon presentation. Liberal attitude taken with regard to hypertension. Chronicity of issue is unknown. Patient is not aware of prior diagnosis of hypertension. Monitor.  I have seen and examined this patient myself. I have spent 35 minutes in her evaluation and care.  DVT Prophylaxis: SCD's CODE STATUS: Full Code Family Communication: None available Disposition: Status is: Inpatient  Remains inpatient appropriate because:Inpatient level of care appropriate due to severity of illness   Dispo: The patient is from: Home              Anticipated d/c is to: Home              Anticipated d/c date is: 1 day              Patient currently is not medically stable to d/c.  Crystal Hoganson, DO [facility] Direct contact: see [email or web address]  7PM-7AM contact night coverage as above 03/01/2020, 1:17 PM  LOS: 1 day

## 2020-03-02 ENCOUNTER — Encounter
Admission: EM | Disposition: A | Discharge status: Home / Self Care | Payer: Self-pay | Source: Home / Self Care | Attending: Internal Medicine

## 2020-03-02 ENCOUNTER — Inpatient Hospital Stay: Payer: Medicaid Other | Admitting: Registered Nurse

## 2020-03-02 ENCOUNTER — Inpatient Hospital Stay
Admit: 2020-03-02 | Discharge: 2020-03-02 | Disposition: A | Discharge status: Home / Self Care | Payer: Medicaid Other | Attending: Cardiology | Admitting: Cardiology

## 2020-03-02 DIAGNOSIS — I341 Nonrheumatic mitral (valve) prolapse: Secondary | ICD-10-CM

## 2020-03-02 DIAGNOSIS — I10 Essential (primary) hypertension: Secondary | ICD-10-CM

## 2020-03-02 HISTORY — DX: Nonrheumatic mitral (valve) prolapse: I34.1

## 2020-03-02 HISTORY — PX: TEE WITHOUT CARDIOVERSION: SHX5443

## 2020-03-02 LAB — CBC WITH DIFFERENTIAL/PLATELET
Abs Immature Granulocytes: 0.01 10*3/uL (ref 0.00–0.07)
Basophils Absolute: 0 10*3/uL (ref 0.0–0.1)
Basophils Relative: 1 %
Eosinophils Absolute: 0.1 10*3/uL (ref 0.0–0.5)
Eosinophils Relative: 2 %
HCT: 31.4 % — ABNORMAL LOW (ref 36.0–46.0)
Hemoglobin: 9.6 g/dL — ABNORMAL LOW (ref 12.0–15.0)
Immature Granulocytes: 0 %
Lymphocytes Relative: 43 %
Lymphs Abs: 2.1 10*3/uL (ref 0.7–4.0)
MCH: 22.4 pg — ABNORMAL LOW (ref 26.0–34.0)
MCHC: 30.6 g/dL (ref 30.0–36.0)
MCV: 73.4 fL — ABNORMAL LOW (ref 80.0–100.0)
Monocytes Absolute: 0.5 10*3/uL (ref 0.1–1.0)
Monocytes Relative: 11 %
Neutro Abs: 2 10*3/uL (ref 1.7–7.7)
Neutrophils Relative %: 43 %
Platelets: 390 10*3/uL (ref 150–400)
RBC: 4.28 MIL/uL (ref 3.87–5.11)
RDW: 19.3 % — ABNORMAL HIGH (ref 11.5–15.5)
WBC: 4.8 10*3/uL (ref 4.0–10.5)
nRBC: 0 % (ref 0.0–0.2)

## 2020-03-02 LAB — MPO/PR-3 (ANCA) ANTIBODIES
ANCA Proteinase 3: <3.5 U/mL (ref 0.0–3.5)
Myeloperoxidase Abs: <9 U/mL (ref 0.0–9.0)

## 2020-03-02 LAB — COMPREHENSIVE METABOLIC PANEL
ALT: 12 U/L (ref 0–44)
AST: 15 U/L (ref 15–41)
Albumin: 3.3 g/dL — ABNORMAL LOW (ref 3.5–5.0)
Alkaline Phosphatase: 59 U/L (ref 38–126)
Anion gap: 8 (ref 5–15)
BUN: 8 mg/dL (ref 6–20)
CO2: 25 mmol/L (ref 22–32)
Calcium: 9.6 mg/dL (ref 8.9–10.3)
Chloride: 104 mmol/L (ref 98–111)
Creatinine, Ser: 0.54 mg/dL (ref 0.44–1.00)
GFR, Estimated: >60 mL/min (ref >60)
Glucose, Bld: 96 mg/dL (ref 70–99)
Potassium: 3.9 mmol/L (ref 3.5–5.1)
Sodium: 137 mmol/L (ref 135–145)
Total Bilirubin: 0.4 mg/dL (ref 0.3–1.2)
Total Protein: 6.8 g/dL (ref 6.5–8.1)

## 2020-03-02 LAB — PREGNANCY, URINE: Preg Test, Ur: NEGATIVE

## 2020-03-02 LAB — ANTI-DNA ANTIBODY, DOUBLE-STRANDED: ds DNA Ab: 12 IU/mL — ABNORMAL HIGH (ref 0–9)

## 2020-03-02 LAB — ANTINUCLEAR ANTIBODIES, IFA: ANA Ab, IFA: NEGATIVE

## 2020-03-02 SURGERY — ECHOCARDIOGRAM, TRANSESOPHAGEAL
Anesthesia: General

## 2020-03-02 MED ORDER — CLOPIDOGREL BISULFATE 75 MG PO TABS: 75.0000 mg | ORAL_TABLET | Freq: Every day | ORAL | 0 refills | Status: DC

## 2020-03-02 MED ORDER — LIDOCAINE VISCOUS HCL 2 % MT SOLN
OROMUCOSAL | Status: AC
  Filled 2020-03-02: qty 15

## 2020-03-02 MED ORDER — PROPOFOL 10 MG/ML IV BOLUS
INTRAVENOUS | Status: DC | PRN
  Administered 2020-03-02 (×3): 20 mg via INTRAVENOUS
  Administered 2020-03-02: 40 mg via INTRAVENOUS
  Administered 2020-03-02: 30 mg via INTRAVENOUS
  Administered 2020-03-02: 20 mg via INTRAVENOUS

## 2020-03-02 MED ORDER — PROPOFOL 10 MG/ML IV BOLUS
INTRAVENOUS | Status: AC
  Filled 2020-03-02: qty 20

## 2020-03-02 MED ORDER — SODIUM CHLORIDE 0.9 % IV SOLN: INTRAVENOUS | Status: DC

## 2020-03-02 MED ORDER — MIDAZOLAM HCL 2 MG/2ML IJ SOLN
INTRAMUSCULAR | Status: AC
  Filled 2020-03-02: qty 2

## 2020-03-02 MED ORDER — BUTAMBEN-TETRACAINE-BENZOCAINE 2-2-14 % EX AERO
INHALATION_SPRAY | CUTANEOUS | Status: AC
  Filled 2020-03-02: qty 5

## 2020-03-02 MED ORDER — NICOTINE 7 MG/24HR TD PT24: 7.0000 mg | MEDICATED_PATCH | Freq: Every day | TRANSDERMAL | 0 refills | Status: DC

## 2020-03-02 MED ORDER — ASPIRIN 81 MG PO CHEW: 81.0000 mg | CHEWABLE_TABLET | Freq: Every day | ORAL | 0 refills | Status: DC

## 2020-03-02 MED ORDER — SODIUM CHLORIDE FLUSH 0.9 % IV SOLN
INTRAVENOUS | Status: AC
  Filled 2020-03-02: qty 10

## 2020-03-02 NOTE — Progress Notes (Signed)
Patient sent home with holter monitor. Education given by Charity fundraiser.

## 2020-03-02 NOTE — Discharge Instructions (Signed)
Alteplase Treatment for Ischemic Stroke  Alteplase is a medicine that can dissolve blood clots. An ischemic stroke is caused by clots that block blood flow to the brain. Alteplase can help treat a stroke if it is given very shortly after stroke symptoms begin. Before giving this medicine, your doctor will decide if the medicine may work for you based on:  Your age.  Your condition.  Other factors. Tell your doctor about:  Any allergies you have.  All medicines you are taking.  Any medical conditions you have.  Any blood disorders you have.  Any bleeding in the last 21 days, including bleeding in the stomach or the vagina.  Any surgeries you have had.  Whether you are pregnant or may be pregnant.  The time your symptoms started. What are the risks? Generally, this is a safe treatment. However, problems may happen, including:  Bleeding in the brain.  Bleeding in other parts of the body.  Allergic reactions to the medicine. What happens before the procedure?  Your doctor will do an exam.  Your doctor will check your body temperature, blood pressure, heart rate, and breathing.  Medicines may be given to adjust your blood pressure, if needed.  You may have tests, such as: ? Blood tests. ? A head scan (CT scan). What happens during the procedure?  An IV will be put into one of your veins.  Alteplase will be given to you through the IV.  In some cases, this medicine may be given directly to the affected area through a thin tube (catheter). This is usually put in at the top of your leg.  Your health care team will closely watch your: ? Blood pressure. ? Heart rate. ? Breathing.  Your doctor will check often to see how well the medicine is working.  If the medicine causes bleeding, it will be stopped. Another treatment will be started. What can I expect after the treatment?  You will be watched closely in the ICU or the stroke unit.  You will be checked by  several specialists, including speech therapists, physical therapists, and occupational therapists.  If you had a catheter, you may have: ? Bruising. ? Soreness. ? Swelling.  It may take days, weeks, or months to fully see how well your body responded to the treatment. Follow these instructions in the hospital: Activity  Do not get out of bed without help. This is for your safety.  Limit activity after your treatment.  Do stroke rehab programs as told by your doctor. General instructions  Take over-the-counter and prescription medicines only as told by your doctor.  Tell a nurse or doctor right away if you have any bleeding, bruising, or injuries.  Use a soft-bristled toothbrush. Brush your teeth gently. Get help right away if you have:  Blood in your vomit, stool, or urine.  A serious fall or accident, or you hit your head.  Symptoms of an allergic reaction, such as rash or difficulty breathing.  Any signs of a stroke. "BE FAST" is an easy way to remember the main warning signs: ? B - Balance. Signs are dizziness, sudden trouble walking, or loss of balance. ? E - Eyes. Signs are trouble seeing or a sudden change in how you see. ? F - Face. Signs are sudden weakness or loss of feeling in the face, or the face or eyelid drooping on one side. ? A - Arms. Signs are weakness or loss of feeling in an arm. This happens suddenly and  usually on one side of the body. ? S - Speech. Signs are sudden trouble speaking, slurred speech, or trouble understanding what people say. ? T - Time. Time to call emergency services. Write down what time symptoms started. ? Other signs of a stroke. This may include:  A sudden, very bad headache with no known cause.  Feeling like you may vomit (nausea).  Vomiting.  A seizure. These symptoms may be an emergency. Do not wait to see if the symptoms will go away. Get medical help right away. Call your local emergency services (911 in the [place] Do not  drive yourself to the hospital. Summary  Alteplase is a medicine that can break up blood clots. Blood clots can cause a stroke.  This medicine may help if you get it as soon as possible after your stroke symptoms start.  You will be watched closely in the ICU or the stroke unit.  Get help right away if you are showing signs of increased bleeding or are having symptoms of stroke. This information is not intended to replace advice given to you by your health care provider. Make sure you discuss any questions you have with your health care provider. Document Revised: 09/14/2018 Document Reviewed: 09/14/2018 Elsevier Patient Education  2020 [employer]   Anterior Interosseous Nerve Syndrome  Anterior interosseous nerve syndrome (AINS) is a nerve disorder that causes pain and weakness in the hand and forearm. The anterior interosseous nerve starts near the inside of the elbow and continues down the forearm, between the two main bones of the forearm. This nerve provides function to the muscles of the index and middle fingers, the thumb, and the forearm. This condition is caused by unusual pressure (compression) on the anterior interosseous nerve. AINS affects hand and forearm function, and it may cause muscle weakness and pain. Unlike other nerve compression syndromes, such as carpal tunnel syndrome, AINS does not cause any numbness or tingling. What are the causes? This condition is caused by compression of the anterior interosseous nerve. Compression may result from:  Nerve injury (trauma).  Abnormal position of a cord of tissue that connects a muscle to a bone (tendon).  Abnormal position of a blood vessel.  Overgrowth or overdevelopment of a forearm muscle (hypertrophy).  Formation of a blood clot (thrombosis) in the main forearm artery (radial artery).  Formation of tissue between muscles of the forearm. In rare cases, AINS may occur when the nerve is not compressed (spontaneous  anterior interosseous nerve syndrome). The cause of spontaneous AINS is not known. What increases the risk? The following factors may make you more likely to develop this condition:  Doing repetitive and intense movements of the forearm and wrist, especially wrist and hand rotation.  Having poor strength and flexibility.  Having type 1 diabetes mellitus.  Having an underactive thyroid gland (hypothyroidism). What are the signs or symptoms? Symptoms of AINS may include one or more of the following:  Pain in the upper forearm, near the elbow.  Difficulty touching the tip of the index finger to the tip of the thumb.  Weakness in the thumb and index finger, especially when bending the thumb or turning the palm down against resistance.  Difficulty writing.  Frequently dropping objects or trouble picking up objects. How is this diagnosed? This condition may be diagnosed based on your symptoms, your medical history, and a physical exam. You may have tests, such as:  Electromyogram (EMG). This measures the electrical activity in your arm and hand muscles.  Ultrasound.  MRI.  Magnetic resonance neurogram (MRN). This creates images of your nerve. How is this treated? Treatment depends on the cause of your condition. Treatment usually includes:  Stopping activities that cause pain.  A hand or arm splint to rest the muscles and nerve.  Medicines that help to relieve pain and inflammation.  Injections of medicines that help to reduce inflammation (steroids).  Applying heat or ice to the injured area, as told by your health care provider.  Physical or occupational therapy to strengthen and stretch your forearm, elbow, and hand. In some cases, surgery may be recommended to relieve pressure on the nerve. This may be done if:  A treatable cause is found.  Your condition is severe.  Your symptoms do not improve or other treatment methods have failed to relieve your pain. Follow  these instructions at home: If you have a splint:  Wear the splint as told by your health care provider. Remove it only as told by your health care provider.  Loosen the splint if your fingers tingle, become numb, or turn cold and blue.  Keep the splint clean and dry.  If the splint is not waterproof: ? Do not let it get wet. ? If approved by your health care provider, you may remove the splint for bathing and for washing your hand. ? If you were told not to remove the splint, cover it with a watertight covering when you take a bath or a shower. Managing pain, stiffness, and swelling      Move your fingers often to avoid stiffness and swelling.  Raise (elevate) your injured arm above the level of your heart while you are sitting or lying down.  If directed, apply heat to the affected area as often as told by your health care provider. Use the heat source that your health care provider recommends, such as a moist heat pack or a heating pad. ? Place a towel between your skin and the heat source. ? Leave the heat on for 20-30 minutes. ? Remove the heat if your skin turns bright red. This is especially important if you are unable to feel pain, heat, or cold. You may have a greater risk of getting burned.  If directed, put ice on the injured area. ? If you have a removable splint, remove it as told by your health care provider. ? Put ice in a plastic bag. ? Place a towel between your skin and the bag. ? Leave the ice on for 20 minutes, 2-3 times a day. Activity  Return to your normal activities as told by your health care provider. Ask your health care provider what activities are safe for you.  Do physical or occupational therapy exercises as told by your health care provider. General instructions  Do not use any products that contain nicotine or tobacco, such as cigarettes, e-cigarettes, and chewing tobacco. These can delay healing. If you need help quitting, ask your health care  provider.  Take over-the-counter and prescription medicines only as told by your health care provider.  Keep all follow-up visits as told by your health care provider. This is important. Contact a health care provider if:  You have symptoms that get worse or do not get better after 8 weeks of treatment.  Your hands feel numb or cold.  Your splint is causing pain, numbness, or tingling that is new. Summary  Anterior interosseous nerve syndrome is compression of a nerve in the forearm that causes pain and weakness.  Treatment for this condition may include wearing a splint, taking pain medicines, and doing exercises for physical or occupational therapy. In some cases, surgery may be needed.  Follow instructions from your health care provider about managing pain, stiffness, and swelling.  Return to your normal activities as told by your health care provider. This information is not intended to replace advice given to you by your health care provider. Make sure you discuss any questions you have with your health care provider. Document Revised: 06/26/2018 Document Reviewed: 01/29/2018 Elsevier Patient Education  2020 [employer]   Cubital Tunnel Syndrome  Cubital tunnel syndrome is a condition that causes pain and weakness of the forearm and hand. It happens when one of the nerves that runs along the inside of the elbow joint (ulnar nerve) becomes irritated. This condition is usually caused by repeated arm motions that are done during sports or work-related activities. What are the causes? This condition may be caused by:  Increased pressure on the ulnar nerve at the elbow, arm, or forearm. This can result from: ? Irritation caused by repeated elbow bending. ? Poorly healed elbow fractures. ? Tumors in the elbow. These are usually noncancerous (benign). ? Scar tissue that develops in the elbow after an injury. ? Bony growths (spurs) near the ulnar nerve.  Stretching of the nerve  due to loose elbow ligaments.  Trauma to the nerve at the elbow. What increases the risk? The following factors may make you more likely to develop this condition:  Doing manual labor that requires frequent bending of the elbow.  Playing sports that include repeated or strenuous throwing motions, such as baseball.  Playing contact sports, such as football or lacrosse.  Not warming up properly before activities.  Having diabetes.  Having an underactive thyroid (hypothyroidism). What are the signs or symptoms? Symptoms of this condition include:  Clumsiness or weakness of the hand.  Tenderness of the inner elbow.  Aching or soreness of the inner elbow, forearm, or fingers, especially the little finger or the ring finger.  Increased pain when forcing the elbow to bend.  Reduced control when throwing objects.  Tingling, numbness, or a burning feeling inside the forearm or in part of the hand or fingers, especially the little finger or the ring finger.  Sharp pains that shoot from the elbow down to the wrist and hand.  The inability to grip or pinch hard. How is this diagnosed? This condition is diagnosed based on:  Your symptoms and medical history. Your health care provider will also ask for details about any injury.  A physical exam. You may also have tests, including:  Electromyogram (EMG). This test measures electrical signals sent by your nerves into the muscles.  Nerve conduction study. This test measures how well electrical signals pass through your nerves.  Imaging tests, such as X-rays, ultrasound, and MRI. These tests check for possible causes of your condition. How is this treated? This condition may be treated by:  Stopping the activities that are causing your symptoms to get worse.  Icing and taking medicines to reduce pain and swelling.  Wearing a splint to prevent your elbow from bending, or wearing an elbow pad where the ulnar nerve is closest to the  skin.  Working with a physical therapist in less severe cases. This may help to: ? Decrease your symptoms. ? Improve the strength and range of motion of your elbow, forearm, and hand. If these treatments do not help, surgery may be needed. Follow these  instructions at home: If you have a splint:  Wear the splint as told by your health care provider. Remove it only as told by your health care provider.  Loosen the splint if your fingers tingle, become numb, or turn cold and blue.  Keep the splint clean.  If the splint is not waterproof: ? Do not let it get wet. ? Cover it with a watertight covering when you take a bath or shower. Managing pain, stiffness, and swelling   If directed, put ice on the injured area: ? Put ice in a plastic bag. ? Place a towel between your skin and the bag. ? Leave the ice on for 20 minutes, 2-3 times a day.  Move your fingers often to avoid stiffness and to lessen swelling.  Raise (elevate) the injured area above the level of your heart while you are sitting or lying down. General instructions  Take over-the-counter and prescription medicines only as told by your health care provider.  Do any exercise or physical therapy as told by your health care provider.  Do not drive or use heavy machinery while taking prescription pain medicine.  If you were given an elbow pad, wear it as told by your health care provider.  Keep all follow-up visits as told by your health care provider. This is important. Contact a health care provider if:  Your symptoms get worse.  Your symptoms do not get better with treatment.  You have new pain.  Your hand on the injured side feels numb or cold. Summary  Cubital tunnel syndrome is a condition that causes pain and weakness of the forearm and hand.  You are more likely to develop this condition if you do work or play sports that involve repeated arm movements.  This condition is often treated by stopping  repetitive activities, applying ice, and using anti-inflammatory medicines.  In rare cases, surgery may be needed. This information is not intended to replace advice given to you by your health care provider. Make sure you discuss any questions you have with your health care provider. Document Revised: 07/24/2017 Document Reviewed: 07/24/2017 Elsevier Patient Education  2020 [employer]   Alteplase Treatment for Ischemic Stroke  An ischemic stroke is caused by blood clots that block blood flow to the brain. Alteplase is a medicine that can dissolve these blood clots. It can help treat a stroke if it is given very shortly after stroke symptoms begin. It is most effective when it is used within 0-4 hours after the start of symptoms. Before giving this treatment, the health care provider will carefully consider whether the treatment is appropriate based on your age, condition, and other factors. Tell a health care provider about:  Any allergies you have.  All medicines you are taking, including blood thinners, vitamins, herbs, and over-the-counter medicines.  Any medical conditions you have.  Any blood disorders you have.  Any active bleeding in the last 21 days, including gastrointestinal (GI) or vaginal bleeding.  Any surgeries or procedures you have had.  Whether you are pregnant or may be pregnant.  When your stroke symptoms started. What are the risks? Generally, this is a safe treatment. However, problems may occur, including:  Bleeding into the brain.  Bleeding in other parts of the body.  Allergic reaction. What happens before the treatment?  Your health care provider will perform a physical exam and take a detailed medical history.  Your temperature, blood pressure, heart rate, and breathing rate (vital signs) will be monitored  closely. If needed, you may be given medicines to adjust your blood pressure.  You may have tests, including blood tests or imaging  studies, such as a CT scan. What happens during treatment?  An IV will be inserted into one of your veins.  Alteplase will be given to you through this IV, usually over the course of 1 hour.  In some cases, this medicine may be given directly into the affected artery through a thin tube (catheter). This is usually inserted in the artery at the top of your leg.  Your vital signs and brain function will be monitored closely as you receive this medicine.  If bleeding occurs, the medicine will be stopped and appropriate therapy will be started. The procedure may vary among health care providers and hospitals. What can I expect after treatment?  You will be monitored frequently by your health care team in an intensive care unit or a stroke unit.  You will be checked by a speech therapist, physical therapist, and an occupational therapist.  If you had a catheter, you may have bruising, soreness, and swelling at the catheter insertion site.  It may take several days, weeks, or even months to fully determine how well you responded to the alteplase treatment. Follow these instructions in the hospital: Activity  Do not get out of bed without help. This is for your safety.  Limit activity after your treatment.  Participate in stroke rehabilitation programs as told by your health care provider. General instructions  Take over-the-counter and prescription medicines only as told by your health care provider.  Tell a nurse or health care provider right away if you have any bleeding, bruising or injuries.  Use a soft-bristled toothbrush. Brush your teeth gently. Get help right away if you have:  Blood in your vomit, stool, or urine.  A serious fall or accident, or you hit your head.  Symptoms of an allergic reaction, such as a rash or difficulty breathing.  Any symptoms of a stroke. "BE FAST" is an easy way to remember the main warning signs: ? B - Balance. Signs are dizziness, sudden  trouble walking, or loss of balance. ? E - Eyes. Signs are trouble seeing or a sudden change in vision. ? F - Face. Signs are sudden weakness or numbness of the face, or the face or eyelid drooping on one side. ? A - Arms. Signs are weakness or numbness in an arm. This happens suddenly and usually on one side of the body. ? S - Speech. Signs are sudden trouble speaking, slurred speech, or trouble understanding what people say. ? T - Time. Time to call emergency services. Write down what time symptoms started.  Other signs of stroke, such as: ? A sudden, very bad headache with no known cause. ? Nausea or vomiting. ? Seizure. These symptoms may represent a serious problem that is an emergency. Do not wait to see if the symptoms will go away. Get medical help right away. Call your local emergency services (911 in the [place] Do not drive yourself to the hospital. Summary  Alteplase is a medicine that can dissolve blood clots. It can be used to open blocked arteries during an ischemic stroke.  To be effective, this medicine must be given very shortly after stroke symptoms begin, within 4 hours after the start of symptoms.  You will be monitored closely by your health care team during and after receiving alteplase.  Get help right away if you are showing signs of  bleeding or are having symptoms of stroke. This information is not intended to replace advice given to you by your health care provider. Make sure you discuss any questions you have with your health care provider. Document Revised: 09/14/2018 Document Reviewed: 09/14/2018 Elsevier Patient Education  2020 [employer].   Cognitive Rehabilitation After a Stroke After a stroke, you may have various problems with thinking (cognitive disability). The types of problems you have will depend on how severe the stroke was and where it was located in the brain. Problems may include:  Problems with short-term memory.  Trouble paying  attention.  Trouble communicating or understanding language (aphasia).  A drop in mental ability that may interfere with daily life (dementia).  Trouble with problem-solving and information processing.  Problems with reading, writing, or math.  Problems with your ability to plan and to perform activities in sequence (executive function). These problems can feel overwhelming. However, with rehabilitation and time to heal, many people have improvement in their symptoms. What causes cognitive disability? A stroke happens when blood cannot flow to certain areas of the brain. When this happens, brain cells die in the affected areas because they cannot get oxygen and nutrients from the blood. Cognitive disability is caused by the death of cells in the areas of the brain that control thinking. What is cognitive rehabilitation? Cognitive rehabilitation is a program to help you improve your thinking skills after a stroke. Rehabilitation cannot completely reverse the effects of a stroke, but it can help you with memory, problem-solving, and communication skills. Therapy focuses on:  Improving brain function. This may involve activities such as learning to break down tasks into simple steps.  Helping you learn ways to cope with thinking problems. For example, you might learn memory tricks or do activities that stimulate memory, such as naming objects or describing pictures. Cognitive rehabilitation may include:  Speech-language therapy to help you understand and use language to communicate.  Occupational therapy to help you perform daily activities.  Music therapy to help relieve stress, anxiety, and depression. This may involve listening to music, singing, or playing instruments.  Physical therapy to help improve your ability to move and perform actions that involve the muscles (motor functions). When will therapy start and where will I have therapy? Your health care provider will decide when it  is best for you to start therapy. In some cases, people start rehabilitation as soon as their health is stable, which may be 24-48 hours after the stroke. Rehabilitation can take place in a few different places, based on your needs. It may take place in:  The hospital or an in-patient rehabilitation hospital.  An outpatient rehabilitation facility.  A long-term care facility.  A community rehabilitation clinic.  Your home. What are some tools to help after a stroke? There are a number of tools and apps that you can use on your smartphone, personal computer, or tablet to help improve brain function. Some of these apps include:  Calendar reminders or alarm apps to help with memory.  Note-taking or sketch pad apps to help with memory or communication.  Text-to-speech apps that allow you to listen to what you are reading, which helps your ability to understanding text.  Picture dictionary or picture message apps to help with communication.  E-readers. These can highlight text as it is read aloud, which helps with listening and reading skills. How can my friends or family help during my rehabilitation? During your recovery, it is important that your friends and  family members help you work toward more independence. Your caregivers should speak with your health care providers to learn how they can best help you during recovery. This may include working on speech-language or memory exercises at home, or helping with daily tasks and errands. If you have cognitive disability, you may be at risk for injury or accidents at home, such as forgetting to turn off the stove. Friends and family members can help ensure home safety by taking steps such as getting appliances with automatic shut-off features or storing dangerous objects in a secure place. What else should I know about cognitive rehabilitation after a stroke? Having trouble with memory and problem-solving can make you feel alone. You may also  have mood changes, anxiety, or depression after a stroke. It is important to:  Stay connected with others through social groups, online support groups, or your community.  Talk to your friends, family, and caregivers about any emotional problems you are having.  Go to one-on-one or group therapy as suggested by your health care provider.  Stay physically active and exercise as often as suggested by your health care provider. Summary  After a stroke, some people have problems with thinking that affect attention, memory, language, communication, and problem-solving.  Cognitive rehabilitation is a program to help you regain brain function and learn skills to cope with thinking problems.  Rehabilitation cannot completely reverse the effects of a stroke, but it can help to improve quality of life.  Cognitive rehabilitation may include speech-language therapy, occupational therapy, music therapy, and physical therapy. This information is not intended to replace advice given to you by your health care provider. Make sure you discuss any questions you have with your health care provider. Document Revised: 06/27/2018 Document Reviewed: 06/10/2016 Elsevier Patient Education  2020 [employer].   Eating Plan After Stroke A stroke causes damage to the brain cells, which can affect your ability to walk, talk, and even eat. The impact of a stroke is different for everyone, and so is recovery. A good nutrition plan is important for your recovery. It can also lower your risk of another stroke. If you have difficulty chewing and swallowing your food, a dietitian or your stroke care team can help so that you can enjoy eating healthy foods. What are tips for following this plan?  Reading food labels  Choose foods that have less than 300 milligrams (mg) of sodium per serving. Limit your sodium intake to less than 1,500 mg per day.  Avoid foods that have saturated fat and trans fat.  Choose foods that  are low in cholesterol. Limit the amount of cholesterol you eat each day to less than 200 mg.  Choose foods that are high in fiber. Eat 20-30 grams (g) of fiber each day.  Avoid foods with added sugar. Check the food label for ingredients such as sugar, corn syrup, honey, fructose, molasses, and cane juice. Shopping  At the grocery store, buy most of your food from areas near the walls of the store. This includes: ? Fresh fruits and vegetables. ? Dry grains, beans, nuts, and seeds. ? Fresh seafood, poultry, lean meats, and eggs. ? Low-fat dairy products.  Buy whole ingredients instead of prepackaged foods.  Buy fresh, in-season fruits and vegetables from local farmers markets.  Buy frozen fruits and vegetables in resealable bags. Cooking  Prepare foods with very little salt. Use herbs or salt-free spices instead.  Cook with heart-healthy oils, such as olive, avocado, canola, soybean, or sunflower oil.  Avoid frying foods. Bake, grill, or broil foods instead.  Remove visible fat and skin from meat and poultry before eating.  Modify food textures as told by your health care provider. Meal planning  Eat a wide variety of colorful fruits and vegetables. Make sure one-half of your plate is filled with fruits and vegetables at each meal.  Eat fruits and vegetables that are high in potassium, such as: ? Apples, bananas, oranges, and melon. ? Sweet potatoes, spinach, zucchini, and tomatoes.  Eat fish that contain heart-healthy fats (omega-3 fats) at least twice a week. These include salmon, tuna, mackerel, and sardines.  Eat plant foods that are high in omega-3 fats, such as flaxseeds and walnuts. Add these to cereals, yogurt, or pasta dishes.  Eat several servings of high-fiber foods each day, such as fruits, vegetables, whole grains, and beans.  Do not put salt at the table for meals.  When eating out at restaurants: ? Ask the server about low-salt or salt-free food  options. ? Avoid fried foods. Look for menu items that are grilled, steamed, broiled, or roasted. ? Ask if your food can be prepared without butter. ? Ask for condiments, such as salad dressings, gravy, or sauces to be served on the side.  If you have difficulty swallowing: ? Choose foods that are softer and easier to chew and swallow. ? Cut foods into small pieces and chew well before swallowing. ? Thicken liquids as told by your health care provider or dietitian. ? Let your health care provider know if your condition does not improve over time. You may need to work with a speech therapist to re-train the muscles that are used for eating. General recommendations  Involve your family and friends in your recovery, if possible. It may be helpful to have a slower meal time and to plan meals that include foods everyone in the family can eat.  Brush your teeth with fluoride toothpaste twice a day, and floss once a day. Keeping a clean mouth can help you swallow and can also help your appetite.  Drink enough water each day to keep your urine pale yellow. If needed, set reminders or ask your family to help you remember to drink water.  Limit alcohol intake to no more than 1 drink a day for nonpregnant women and 2 drinks a day for men. One drink equals 12 oz of beer, 5 oz of wine, or 1 oz of hard liquor. Summary  Following this eating plan can help in your stroke recovery and can decrease your risk for another stroke.  Let your health care provider know if you have problems with swallowing. You may need to work with a speech therapist. This information is not intended to replace advice given to you by your health care provider. Make sure you discuss any questions you have with your health care provider. Document Revised: 06/28/2018 Document Reviewed: 05/15/2017 Elsevier Patient Education  2020 [employer].

## 2020-03-02 NOTE — Consult Note (Signed)
CARDIOLOGY CONSULT NOTE               Patient ID: Crystal Benitez MRN: [id number] DOB/AGE: 1981-02-08 39 y.o.  Admit date: 02/29/2020 Referring Physician Dr. Joylene Igo hospitalist Primary Physician Harrietta Guardian nurse practitioner Primary Cardiologist  Reason for Consultation left arm weakness presumed CVA evaluation for possible A. fib  HPI: Patient is a 39 year old female known history for mild obesity is not nicotine dependence complains of acute onset of weakness and numbness left arm tingling came to emergency room patient also complained of headache she denies any illicit drug use or alcohol episode got progressively worse after she took a nap and had profound weakness of her left arm and has been relatively persistent until she came to emergency room for evaluation.  Patient denies any palpitation tachycardia or shortness of breath.  No previous cardiac history no illicit IV drug use no fever chills or sweats denies any prior history of cardiac arrhythmias  Review of systems complete and found to be negative unless listed above     History reviewed. No pertinent past medical history.  Past Surgical History:  Procedure Laterality Date  . CHOLECYSTECTOMY    . TUBAL LIGATION      Medications Prior to Admission  Medication Sig Dispense Refill Last Dose  . albuterol (PROVENTIL HFA;VENTOLIN HFA) 108 (90 BASE) MCG/ACT inhaler Inhale 2 puffs into the lungs every 4 (four) hours as needed for wheezing. (Patient not taking: Reported on 04/27/2016) 1 Inhaler 0   . azithromycin (ZITHROMAX Z-PAK) 250 MG tablet Take 1 tablet (250 mg total) by mouth daily. 500mg  PO day 1, then 250mg  PO days 205 (Patient not taking: Reported on 02/17/2014) 6 tablet 0   . HYDROcodone-acetaminophen (NORCO) 5-325 MG tablet Take 1-2 tablets by mouth every 6 (six) hours as needed. (Patient not taking: Reported on 04/27/2016) 20 tablet 0   . ibuprofen (ADVIL,MOTRIN) 600 MG tablet Take 1 tablet (600 mg total) by mouth every  8 (eight) hours as needed. 30 tablet 0   . ondansetron (ZOFRAN) 4 MG tablet Take 1 tablet (4 mg total) by mouth every 6 (six) hours. (Patient not taking: Reported on 04/27/2016) 12 tablet 0    Social History   Socioeconomic History  . Marital status: Single    Spouse name: Not on file  . Number of children: Not on file  . Years of education: Not on file  . Highest education level: Not on file  Occupational History  . Not on file  Tobacco Use  . Smoking status: Current Every Day Smoker    Packs/day: 0.50    Types: Cigarettes  . Smokeless tobacco: Never Used  Substance and Sexual Activity  . Alcohol use: Yes    Comment: occasional   . Drug use: No  . Sexual activity: Not Currently  Other Topics Concern  . Not on file  Social History Narrative  . Not on file   Social Determinants of Health   Financial Resource Strain: Not on file  Food Insecurity: Not on file  Transportation Needs: Not on file  Physical Activity: Not on file  Stress: Not on file  Social Connections: Not on file  Intimate Partner Violence: Not on file    History reviewed. No pertinent family history.    Review of systems complete and found to be negative unless listed above      PHYSICAL EXAM  General: Well developed, well nourished, in no acute distress HEENT:  Normocephalic and atramatic mild speech deficit Neck:  No JVD.  Lungs: Clear bilaterally to auscultation and percussion. Heart: HRRR . Normal S1 and S2 without gallops or murmurs.  Abdomen: Bowel sounds are positive, abdomen soft and non-tender  Msk:  Back normal, normal gait. Normal strength and tone for age. Extremities: No clubbing, cyanosis or edema.   Neuro: Alert and oriented X 3. Psych:  Good affect, responds appropriately  Labs:   Lab Results  Component Value Date   WBC 4.8 03/02/2020   HGB 9.6 (L) 03/02/2020   HCT 31.4 (L) 03/02/2020   MCV 73.4 (L) 03/02/2020   PLT 390 03/02/2020    Recent Labs  Lab 03/02/20 0426  NA  137  K 3.9  CL 104  CO2 25  BUN 8  CREATININE 0.54  CALCIUM 9.6  PROT 6.8  BILITOT 0.4  ALKPHOS 59  ALT 12  AST 15  GLUCOSE 96   No results found for: CKTOTAL, CKMB, CKMBINDEX, TROPONINI  Lab Results  Component Value Date   CHOL 135 03/01/2020   Lab Results  Component Value Date   HDL 57 03/01/2020   Lab Results  Component Value Date   LDLCALC 55 03/01/2020   Lab Results  Component Value Date   TRIG 115 03/01/2020   Lab Results  Component Value Date   CHOLHDL 2.4 03/01/2020   Lab Results  Component Value Date   LDLDIRECT 69.7 02/29/2020      Radiology: MR BRAIN WO CONTRAST  Result Date: 02/29/2020 CLINICAL DATA:  39 year old female code stroke presentation. Left neck and upper extremity symptoms with right MCA territory infarct on plain CT ASPECTS 8. CTA negative for large vessel occlusion, but positive for mild vessel irregularity at the distal right ICA and MCA origin. EXAM: MRI HEAD WITHOUT CONTRAST TECHNIQUE: Multiplanar, multiecho pulse sequences of the brain and surrounding structures were obtained without intravenous contrast. COMPARISON:  CT head, CTA head and neck earlier today. FINDINGS: Brain: Patchy restricted diffusion throughout the posterior right middle and superior frontal gyri. Confluent involvement of the posterior right occipital lobe (series 7, image 6) and posterior right middle frontal gyrus (image 12) each area about 3 cm diameter. Pre motor, motor and postcentral areas of involvement. Punctate additional right frontal operculum involvement. No abnormal diffusion outside of the right MCA territory. T2 and FLAIR hyperintense cytotoxic edema in the affected areas. Faint petechial hemorrhage identified, including at the perirolandic involvement (series 14, image 44). But no mass effect. But furthermore, there are multifocal small areas of cortical and subcortical encephalomalacia in the contralateral left posterior MCA territory (series 9, image 37).  Also faint associated chronic microhemorrhage (series 14, image 41). No other vascular territories appear affected. Deep gray nuclei, brainstem and cerebellum appear normal. No midline shift, mass effect, evidence of mass lesion, ventriculomegaly, extra-axial collection. Cervicomedullary junction and pituitary are within normal limits. Vascular: Major intracranial vascular flow voids are preserved. Skull and upper cervical spine: Negative. Sinuses/Orbits: Negative. Other: Mastoids are clear. Grossly normal visible internal auditory structures. IMPRESSION: 1. Positive for scattered acute infarcts in the Right MCA territory with cytotoxic edema and occasional petechial hemorrhage, but no malignant hemorrhagic transformation or mass effect. 2. However, contralateral posterior Left MCA territory scattered chronic encephalomalacia is noted, also with occasional chronic micro-hemorrhage. 3. Therefore this constellation of MRI and CTA findings is suspicious for recurrent ischemia secondary to a mechanism such as PFO or similar risk-factor. Electronically Signed   By: Odessa Fleming M.D.   On: 02/29/2020 17:35   ECHOCARDIOGRAM COMPLETE BUBBLE STUDY  Result  Date: 03/01/2020    ECHOCARDIOGRAM REPORT   Patient Name:   Crystal Benitez Date of Exam: 03/01/2020 Medical Rec #:  [id number]      Height:       66.0 in Accession #:    [id number]     Weight:       161.6 lb Date of Birth:  Oct 07, 1980       BSA:          1.827 m Patient Age:    39 years       BP:           123/78 mmHg Patient Gender: F              HR:           71 bpm. Exam Location:  [facility] Procedure: 2D Echo and Saline Contrast Bubble Study Indications:     Stroke I63.9  History:         Patient has no prior history of Echocardiogram examinations.  Sonographer:     Wonda Cerise RDCS Referring Phys:  [id number] Terrilee Files [place] [place] Diagnosing Phys: Julien Nordmann MD IMPRESSIONS  1. Left ventricular ejection fraction, by estimation, is 60 to 65%. The left ventricle has normal  function. The left ventricle has no regional wall motion abnormalities. Left ventricular diastolic parameters are consistent with Grade I diastolic dysfunction (impaired relaxation).  2. Right ventricular systolic function is normal. The right ventricular size is normal. Tricuspid regurgitation signal is inadequate for assessing PA pressure.  3. Agitated saline contrast bubble study was negative, with no evidence of any interatrial shunt. FINDINGS  Left Ventricle: Left ventricular ejection fraction, by estimation, is 60 to 65%. The left ventricle has normal function. The left ventricle has no regional wall motion abnormalities. The left ventricular internal cavity size was normal in size. There is  no left ventricular hypertrophy. Left ventricular diastolic parameters are consistent with Grade I diastolic dysfunction (impaired relaxation). Right Ventricle: The right ventricular size is normal. No increase in right ventricular wall thickness. Right ventricular systolic function is normal. Tricuspid regurgitation signal is inadequate for assessing PA pressure. Left Atrium: Left atrial size was normal in size. Right Atrium: Right atrial size was normal in size. Pericardium: There is no evidence of pericardial effusion. Mitral Valve: The mitral valve is normal in structure. No evidence of mitral valve regurgitation. No evidence of mitral valve stenosis. Tricuspid Valve: The tricuspid valve is normal in structure. Tricuspid valve regurgitation is not demonstrated. No evidence of tricuspid stenosis. Aortic Valve: The aortic valve is normal in structure. Aortic valve regurgitation is not visualized. No aortic stenosis is present. Aortic valve peak gradient measures 8.6 mmHg. Pulmonic Valve: The pulmonic valve was normal in structure. Pulmonic valve regurgitation is not visualized. No evidence of pulmonic stenosis. Aorta: The aortic root is normal in size and structure. Venous: The inferior vena cava is normal in size with  greater than 50% respiratory variability, suggesting right atrial pressure of 3 mmHg. IAS/Shunts: No atrial level shunt detected by color flow Doppler. Agitated saline contrast was given intravenously to evaluate for intracardiac shunting. Agitated saline contrast bubble study was negative, with no evidence of any interatrial shunt.  LEFT VENTRICLE PLAX 2D LVIDd:         4.69 cm  Diastology LVIDs:         3.30 cm  LV e' medial:    6.74 cm/s LV PW:         1.15 cm  LV E/e' medial:  10.9  LV IVS:        1.06 cm  LV e' lateral:   7.29 cm/s LVOT diam:     2.20 cm  LV E/e' lateral: 10.1 LV SV:         70 LV SV Index:   38 LVOT Area:     3.80 cm  RIGHT VENTRICLE RV Basal diam:  2.51 cm RV S prime:     13.90 cm/s TAPSE (M-mode): 2.5 cm LEFT ATRIUM           Index       RIGHT ATRIUM          Index LA diam:      2.50 cm 1.37 cm/m  RA Area:     8.73 cm LA Vol (A2C): 57.6 ml 31.53 ml/m RA Volume:   15.70 ml 8.60 ml/m LA Vol (A4C): 19.3 ml 10.57 ml/m  AORTIC VALVE                PULMONIC VALVE AV Area (Vmax): 2.43 cm    PV Vmax:       0.87 m/s AV Vmax:        147.00 cm/s PV Peak grad:  3.0 mmHg AV Peak Grad:   8.6 mmHg LVOT Vmax:      94.00 cm/s LVOT Vmean:     63.800 cm/s LVOT VTI:       0.184 m  AORTA Ao Root diam: 3.40 cm Ao Asc diam:  2.90 cm MITRAL VALVE MV Area (PHT): 5.16 cm    SHUNTS MV Decel Time: 147 msec    Systemic VTI:  0.18 m MV E velocity: 73.70 cm/s  Systemic Diam: 2.20 cm MV A velocity: 66.80 cm/s MV E/A ratio:  1.10 Julien Nordmann MD Electronically signed by Julien Nordmann MD Signature Date/Time: 03/01/2020/2:28:40 PM    Final    CT HEAD CODE STROKE WO CONTRAST  Result Date: 02/29/2020 CLINICAL DATA:  Code stroke. 39 year old female with pain and numbness radiating from the left neck to the arm and hand. EXAM: CT HEAD WITHOUT CONTRAST TECHNIQUE: Contiguous axial images were obtained from the base of the skull through the vertex without intravenous contrast. COMPARISON:  None. FINDINGS: Brain:  Patchy cytotoxic edema right posterior frontal lobe and parietal lobe, right middle and superior frontal gyri. Perirolandic cortex involvement. Pre motor and postcentral involvement also. No associated hemorrhage. No significant mass effect. Superimposed mild additional asymmetric white matter hypodensity in both hemispheres. Elsewhere gray-white matter differentiation within normal limits. No chronic cortical encephalomalacia identified. No acute intracranial hemorrhage identified. No ventriculomegaly. No midline shift, mass effect, or evidence of intracranial mass lesion. Vascular: No suspicious intracranial vascular hyperdensity. Skull: Negative. Sinuses/Orbits: Visualized paranasal sinuses and mastoids are clear. Other: Mildly Disconjugate gaze. Otherwise negative orbit and scalp soft tissues. ASPECTS [facility] Stroke Program Early CT Score) Total score (0-10 with 10 being normal): 8 (abnormal right M 5 and M6 segments IMPRESSION: 1. Patchy cytotoxic edema in the posterior right MCA territory. ASPECTS 8. 2. No associated hemorrhage or mass effect. Mild additional asymmetric white matter hypodensity on the right. 3. These results were communicated to Dr. Derry Lory at 3:44 pm on 02/29/2020 by text page via the [facility] messaging system. Electronically Signed   By: Odessa Fleming M.D.   On: 02/29/2020 15:45   CT ANGIO HEAD CODE STROKE  Result Date: 02/29/2020 CLINICAL DATA:  39 year old female code stroke presentation. Left upper extremity pain and numbness. EXAM: CT ANGIOGRAPHY HEAD AND NECK TECHNIQUE: Multidetector CT imaging of the  head and neck was performed using the standard protocol during bolus administration of intravenous contrast. Multiplanar CT image reconstructions and MIPs were obtained to evaluate the vascular anatomy. Carotid stenosis measurements (when applicable) are obtained utilizing NASCET criteria, using the distal internal carotid diameter as the denominator. CONTRAST:  75mL OMNIPAQUE IOHEXOL 350  MG/ML SOLN COMPARISON:  Plain head CT 1535 hours today. FINDINGS: CTA NECK Skeleton: Scattered carious dentition. Chronic C5-C6 disc and endplate degeneration. No acute osseous abnormality identified. Upper chest: Mild paraseptal emphysema in the lung apices. Other neck: Within normal limits. Aortic arch: 3 vessel arch configuration.  No arch atherosclerosis. Right carotid system: Negative. Left carotid system: Negative aside from mild tortuosity. Vertebral arteries: Negative proximal right subclavian artery and right vertebral artery origin. Proximal right V2 segment soft plaque and moderate stenosis suspected on series 5, image 459. Otherwise the right vertebral is patent to the skull base and within normal limits. Proximal left subclavian artery and origin of the mildly dominant left vertebral arteries are normal. The left vertebral is patent and normal to the skull base. CTA HEAD Posterior circulation: Mildly dominant left V4 segment. Patent distal vertebral arteries to the vertebrobasilar junction without plaque or stenosis. Normal right PICA origin. Dominant appearing left AICA. Patent basilar artery without stenosis. SCA and PCA origins are patent. Posterior communicating arteries are diminutive or absent. Bilateral PCA branches are within normal limits. Anterior circulation: Both ICA siphons are patent. The left siphon is normal. The right siphon appears smaller, remains patent to the terminus, but with mild supraclinoid segment irregularity (series 8, image 142. Irregularity continues to the right MCA origin which is patent (series 10, image 18). The right MCA M1 segment remains patent without significant stenosis. Right MCA bifurcation is patent. Right MCA branches are within normal limits. No branch occlusion identified. Left ACA A1 segment is dominant, and there is azygos type ACA anatomy from the left. Anterior communicating artery and ACA branches appear within normal limits. Left MCA origin and M1  segment appear more normal than the right. Left MCA bifurcation is patent. Left MCA branches are within normal limits. Venous sinuses: Patent. Dominant right transverse and sigmoid sinuses. Anatomic variants: Mildly dominant left vertebral artery. Dominant right transverse and sigmoid sinuses. Left ACA A1 segment is dominant, and there is azygos type ACA anatomy from the left. Review of the MIP images confirms the above findings IMPRESSION: 1. Negative for large vessel occlusion. Positive for asymmetric irregularity of the supraclinoid right ICA and right MCA origin. Associated significant stenosis or right MCA branch occlusion identified. Preliminary results of the above discussed by telephone with Dr. Erick Blinks on 02/29/2020 at 1549 hours. 2. 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 But no other atherosclerosis or arterial abnormality identified in the head or neck. Electronically Signed   By: Odessa Fleming M.D.   On: 02/29/2020 16:29   CT ANGIO NECK CODE STROKE  Result Date: 02/29/2020 CLINICAL DATA:  39 year old female code stroke presentation. Left upper extremity pain and numbness. EXAM: CT ANGIOGRAPHY HEAD AND NECK TECHNIQUE: Multidetector CT imaging of the head and neck was performed using the standard protocol during bolus administration of intravenous contrast. Multiplanar CT image reconstructions and MIPs were obtained to evaluate the vascular anatomy. Carotid stenosis measurements (when applicable) are obtained utilizing NASCET criteria, using the distal internal carotid diameter as the denominator. CONTRAST:  75mL OMNIPAQUE IOHEXOL 350 MG/ML SOLN COMPARISON:  Plain head CT 1535 hours today. FINDINGS: CTA NECK Skeleton: Scattered carious dentition.  Chronic C5-C6 disc and endplate degeneration. No acute osseous abnormality identified. Upper chest: Mild paraseptal emphysema in the lung apices. Other neck: Within normal limits. Aortic arch: 3 vessel arch configuration.   No arch atherosclerosis. Right carotid system: Negative. Left carotid system: Negative aside from mild tortuosity. Vertebral arteries: Negative proximal right subclavian artery and right vertebral artery origin. Proximal right V2 segment soft plaque and moderate stenosis suspected on series 5, image 459. Otherwise the right vertebral is patent to the skull base and within normal limits. Proximal left subclavian artery and origin of the mildly dominant left vertebral arteries are normal. The left vertebral is patent and normal to the skull base. CTA HEAD Posterior circulation: Mildly dominant left V4 segment. Patent distal vertebral arteries to the vertebrobasilar junction without plaque or stenosis. Normal right PICA origin. Dominant appearing left AICA. Patent basilar artery without stenosis. SCA and PCA origins are patent. Posterior communicating arteries are diminutive or absent. Bilateral PCA branches are within normal limits. Anterior circulation: Both ICA siphons are patent. The left siphon is normal. The right siphon appears smaller, remains patent to the terminus, but with mild supraclinoid segment irregularity (series 8, image 142. Irregularity continues to the right MCA origin which is patent (series 10, image 18). The right MCA M1 segment remains patent without significant stenosis. Right MCA bifurcation is patent. Right MCA branches are within normal limits. No branch occlusion identified. Left ACA A1 segment is dominant, and there is azygos type ACA anatomy from the left. Anterior communicating artery and ACA branches appear within normal limits. Left MCA origin and M1 segment appear more normal than the right. Left MCA bifurcation is patent. Left MCA branches are within normal limits. Venous sinuses: Patent. Dominant right transverse and sigmoid sinuses. Anatomic variants: Mildly dominant left vertebral artery. Dominant right transverse and sigmoid sinuses. Left ACA A1 segment is dominant, and there  is azygos type ACA anatomy from the left. Review of the MIP images confirms the above findings IMPRESSION: 1. Negative for large vessel occlusion. Positive for asymmetric irregularity of the supraclinoid right ICA and right MCA origin. Associated significant stenosis or right MCA branch occlusion identified. Preliminary results of the above discussed by telephone with Dr. Erick BlinksSALMAN KHALIQDINA on 02/29/2020 at 1549 hours. 2. 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 But no other atherosclerosis or arterial abnormality identified in the head or neck. Electronically Signed   By: Odessa FlemingH  Hall M.D.   On: 02/29/2020 16:29    EKG: Sinus rhythm nonspecific T2 changes  ASSESSMENT AND PLAN:  Acute CVA Nicotine dependence Borderline obesity  Plan Echocardiogram for assessment of source of emboli Continue anticoagulation with aspirin and Plavix Recommend evaluation for occult atrial fibrillation with event monitor Follow-up with PT OT speech therapy for rehab Agree with neurology input for assessment and care Recommend statin therapy with Lipitor or Crestor Advised patient to refrain from tobacco abuse Follow-up with cardiology for event monitor upon discharge  Signed: Alwyn Peawayne D Valincia Touch MD 03/02/2020, 9:29 AM

## 2020-03-02 NOTE — Progress Notes (Signed)
NEUROLOGY PROGRESS NOTE   Date of service: March 02, 2020 Patient Name: Crystal Benitez MRN:  [id number] DOB:  February 16, 1981  Brief HPI  Crystal Benitez is a 39 y.o. female with PMH significant for smoking who presents with patchy R posterior MCA stroke and L hand weakness.   Interval Hx   Reports continuing improvement in the left upper extremity strength and sensation.  Reports tingling rather than numbness in the left arm now  Vitals   Vitals:   03/01/20 2014 03/01/20 2341 03/02/20 0414 03/02/20 0736  BP: 123/80 110/72 127/77 112/70  Pulse: 80 75 72 81  Resp:  '16 16 16  ' Temp: 98.7 F (37.1 C) 98.9 F (37.2 C) 97.8 F (36.6 C) 98.3 F (36.8 C)  TempSrc:   Oral   SpO2: 100% 97% 100% 100%  Weight:      Height:         Body mass index is 26.08 kg/m.  Physical Exam   General: Laying comfortably in bed; in no acute distress.  HENT: Normal oropharynx and mucosa. Normal external appearance of ears and nose.  Neck: Supple, no pain or tenderness  CV: No JVD. No peripheral edema.  Pulmonary: Symmetric Chest rise. Normal respiratory effort.  Abdomen: Soft to touch, non-tender.  Ext: No cyanosis, edema, or deformity  Skin: No rash. Normal palpation of skin.   Musculoskeletal: Normal digits and nails by inspection. No clubbing.   Neurologic Examination  Mental status/Cognition: Alert, oriented to self, place, month and year, good attention.  Speech/language: Fluent, comprehension intact, object naming intact, repetition intact.  Cranial nerves:   CN II Pupils equal and reactive to light, no VF deficits    CN III,IV,VI EOM intact, no gaze preference or deviation, no nystagmus    CN V normal sensation in V1, V2, and V3 segments bilaterally    CN VII no asymmetry, no nasolabial fold flattening    CN VIII normal hearing to speech    CN IX & X normal palatal elevation, no uvular deviation    CN XI 5/5 head turn and 5/5 shoulder shrug bilaterally    CN XII midline tongue  protrusion    Motor: Normal tone and range of motion.  Left upper extremity is 4 -/5 with the shoulder, elbow and wrist flexors and extensors.  Left lower extremity is 5/5.  Right upper and lower extremity 5/5. Sensation: Intact in both extremities but does extinguish the left side on double simultaneous stimulation.  Coordination/Complex Motor:  No dysmetria   Labs   Basic Metabolic Panel:  Lab Results  Component Value Date   NA 137 03/02/2020   K 3.9 03/02/2020   CO2 25 03/02/2020   GLUCOSE 96 03/02/2020   BUN 8 03/02/2020   CREATININE 0.54 03/02/2020   CALCIUM 9.6 03/02/2020   GFRNONAA >60 03/02/2020   GFRAA >60 04/27/2016   HbA1c:  Lab Results  Component Value Date   HGBA1C 5.5 03/01/2020   LDL:  Lab Results  Component Value Date   LDLCALC 55 03/01/2020   Urine Drug Screen:     Component Value Date/Time   LABOPIA NONE DETECTED 03/01/2020 1315   COCAINSCRNUR NONE DETECTED 03/01/2020 1315   LABBENZ NONE DETECTED 03/01/2020 1315   AMPHETMU NONE DETECTED 03/01/2020 1315   THCU NONE DETECTED 03/01/2020 1315   LABBARB NONE DETECTED 03/01/2020 1315    Alcohol Level No results found for: ETH No results found for: PHENYTOIN, ZONISAMIDE, LAMOTRIGINE, LEVETIRACETA No results found for: PHENYTOIN, PHENOBARB, VALPROATE, CBMZ  Imaging and Diagnostic studies   CT Head without contrast: Demonstrates a posteroir R MCA ischemic stroke.  CT angio Head and Neck with contrast: No LVO, irregular supraclinoid R ICA and R MCA origin with R MCA stenosis.  MRI Brain: Scattered acute R MCA infarcts with cytotoxic edema and petechial hemorrhage. Contralateral L MCA territory scattered chronic encephalomalacia.   Impression   Crystal Benitez is a 39 y.o. female with PMH significant for smoking who presents with patchy R posterior MCA stroke and L hand weakness.  Etiology under investigation-cardioembolic etiology favored.    Recommendations  - Frequent Neuro checks per stroke  unit protocol -She is being evaluated for cardiology today for TEE.  If negative, will benefit from longer term cardiac monitoring with a event monitor versus loop recorder. - LDL of 55-goal less than 70. - HbA1c of 5.7 - Aspirin 70m daily along with plavix 76mdaily for 3 weeks, followed by Aspirin 8143maily alone. - gradual normotension - Stroke education - PT/OT/SLP consulted - Hypercoagulable workup with normal CRP, mildily elevated ESR, rest of the studies are pending. - Extensively spoke to her about her risk of stroke recurrence with continued smoking and that she should quit. She is appreicative and understands the risk of continued smoking and will not smoke cigarettes at discharge.  -- AshAmie PortlandD [facility] Pager: [phone]

## 2020-03-02 NOTE — Progress Notes (Signed)
Patient clinically stable post  TEE per Dr Lady Gary done per anesthesia. Awake/alert and oriented post procedure. Denies complaints at this time. Report called to Care nurse 113 with questions answered. Taking ice chips without difficulty.

## 2020-03-02 NOTE — Procedures (Signed)
   TRANSESOPHAGEAL ECHOCARDIOGRAM   NAME:  Crystal Benitez   MRN: [id number] DOB:  10-Jan-1981   ADMIT DATE: 02/29/2020  INDICATIONS:   PROCEDURE:   Informed consent was obtained prior to the procedure. The risks, benefits and alternatives for the procedure were discussed and the patient comprehended these risks.  Risks include, but are not limited to, cough, sore throat, vomiting, nausea, somnolence, esophageal and stomach trauma or perforation, bleeding, low blood pressure, aspiration, pneumonia, infection, trauma to the teeth and death.    After a procedural time-out, the patient was sedated per the department of anesthesia.   The transesophageal probe was inserted in the esophagus and stomach without difficulty and multiple views were obtained. I was present for the entire procedure.    COMPLICATIONS:    There were no immediate complications.  FINDINGS:  LEFT VENTRICLE: EF = 55%. No regional wall motion abnormalities.  RIGHT VENTRICLE: Normal size and function.   LEFT ATRIUM: Normal size. No thrombus  LEFT ATRIAL APPENDAGE: No thrombus.   RIGHT ATRIUM: Normal size. No thrombus  AORTIC VALVE:  [place]leaflet. No vegetations or significant regurg.  MITRAL VALVE:    Normal. No vegetation. Trivial mr  TRICUSPID VALVE: Normal. Trivial to mild tr. No vegetations.   PULMONIC VALVE: Grossly normal.  INTERATRIAL SEPTUM: No PFO or ASD. Agitated saline contrast was used.   PERICARDIUM: No effusion  DESCENDING AORTA: Normal  CONCLUSION: No cardiac source of emboli. Normal left atrial appendage Negative bubble study for pfo

## 2020-03-02 NOTE — Evaluation (Signed)
Physical Therapy Evaluation Patient Details Name: Crystal Benitez MRN: [id number] DOB: [birth year] Today's Date: 03/02/2020   History of Present Illness  Crystal Benitez is a 39 y.o. female with medical history significant for nicotine dependence who presents to the emergency room by EMS for evaluation of left arm weakness/numbness and tingling. MRI 02/29/20 : Positive for scattered acute infarcts in the Right MCA territory with cytotoxic edema and occasional petechial hemorrhage, but no malignant hemorrhagic transformation or mass effect.  Clinical Impression  Patient received in bed, reports left arm is moving a little better. Reports no difficulties with left LE. Has been getting up to bathroom independently. Patient performed bed mobility with mod independence. Observed slight inattention to left LE upon getting to edge of bed. Patient performed sit to stand with supervision. Ambulated 200 feet without AD, however reaching for rail in hallway. Left LE demonstrates deficits described below during gait. Strength in L LE normal, sensation diminished around toes, top of foot.  Patient will continue to benefit from skilled PT while here to improve safety with mobility.       Follow Up Recommendations Outpatient PT    Equipment Recommendations  None recommended by PT    Recommendations for Other Services       Precautions / Restrictions Precautions Precautions: None Restrictions Weight Bearing Restrictions: No      Mobility  Bed Mobility Overal bed mobility: Modified Independent             General bed mobility comments: use of bed rail to assist    Transfers Overall transfer level: Needs assistance   Transfers: Sit to/from Stand Sit to Stand: Modified independent (Device/Increase time)         General transfer comment: use of bed rail  Ambulation/Gait Ambulation/Gait assistance: Min guard Gait Distance (Feet): 200 Feet Assistive device: None Gait Pattern/deviations:  Step-through pattern;Decreased step length - left;Decreased dorsiflexion - left;Decreased weight shift to left Gait velocity: decreased   General Gait Details: patient feeling slightly unsteady, reaching for rail in hallway  Stairs            Wheelchair Mobility    Modified Rankin (Stroke Patients Only) Modified Rankin (Stroke Patients Only) Pre-Morbid Rankin Score: No symptoms Modified Rankin: Slight disability     Balance Overall balance assessment: Mild deficits observed, not formally tested                                           Pertinent Vitals/Pain Pain Assessment: No/denies pain    Home Living Family/patient expects to be discharged to:: Private residence Living Arrangements: Non-relatives/Friends Available Help at Discharge: Friend(s);Available PRN/intermittently Type of Home: House Home Access: Stairs to enter Entrance Stairs-Rails: None Entrance Stairs-Number of Steps: 2 Home Layout: One level Home Equipment: None      Prior Function Level of Independence: Independent               Hand Dominance   Dominant Hand: Right    Extremity/Trunk Assessment   Upper Extremity Assessment Upper Extremity Assessment: Defer to OT evaluation LUE Deficits / Details: Grip 3/5, deltoid 3/5, bicep/tricep 2/5, wrist flexion/ext 2-/5. Achieves 2nd/3rd digit opposition c increased time LUE Sensation: decreased light touch LUE Coordination: decreased fine motor    Lower Extremity Assessment Lower Extremity Assessment: LLE deficits/detail LLE Sensation: decreased light touch    Cervical / Trunk Assessment Cervical / Trunk Assessment: Normal  Communication   Communication: No difficulties  Cognition Arousal/Alertness: Awake/alert Behavior During Therapy: WFL for tasks assessed/performed Overall Cognitive Status: Within Functional Limits for tasks assessed                                        General Comments       Exercises     Assessment/Plan    PT Assessment Patient needs continued PT services  PT Problem List Decreased mobility;Decreased coordination;Decreased activity tolerance;Decreased balance;Impaired sensation       PT Treatment Interventions Therapeutic activities;Gait training;Therapeutic exercise;Patient/family education;Stair training;Functional mobility training;Neuromuscular re-education;Balance training    PT Goals (Current goals can be found in the Care Plan section)  Acute Rehab PT Goals Patient Stated Goal: To return to PLOF PT Goal Formulation: With patient Time For Goal Achievement: 03/09/20 Potential to Achieve Goals: Good    Frequency Min 2X/week   Barriers to discharge Decreased caregiver support      Co-evaluation               AM-PAC PT "6 Clicks" Mobility  Outcome Measure Help needed turning from your back to your side while in a flat bed without using bedrails?: None Help needed moving from lying on your back to sitting on the side of a flat bed without using bedrails?: None Help needed moving to and from a bed to a chair (including a wheelchair)?: A Little Help needed standing up from a chair using your arms (e.g., wheelchair or bedside chair)?: A Little Help needed to walk in hospital room?: A Little Help needed climbing 3-5 steps with a railing? : A Little 6 Click Score: 20    End of Session   Activity Tolerance: Patient tolerated treatment well Patient left: in chair;with call bell/phone within reach;with SCD's reapplied Nurse Communication: Mobility status PT Visit Diagnosis: Unsteadiness on feet (R26.81);Other abnormalities of gait and mobility (R26.89);Hemiplegia and hemiparesis;Muscle weakness (generalized) (M62.81);Difficulty in walking, not elsewhere classified (R26.2) Hemiplegia - Right/Left: Left Hemiplegia - dominant/non-dominant: Non-dominant Hemiplegia - caused by: Cerebral infarction    Time: 0177-9390 PT Time Calculation (min)  (ACUTE ONLY): 18 min   Charges:   PT Evaluation $PT Eval Low Complexity: 1 Low PT Treatments $Gait Training: 8-22 mins        Nikolaos Maddocks, PT, GCS 03/02/20,10:03 AM

## 2020-03-02 NOTE — Progress Notes (Signed)
*  PRELIMINARY RESULTS* Echocardiogram Echocardiogram Transesophageal has been performed.  Cristela Blue 03/02/2020, 1:30 PM

## 2020-03-02 NOTE — Progress Notes (Signed)
SLP Cancellation Note  Patient Details Name: Crystal Benitez MRN: [id number] DOB: November 23, 1980   Cancelled treatment:       Reason Eval/Treat Not Completed: SLP screened, no needs identified, will sign off (chart reviewed; consulted NSG then met w/ pt). Pt denied any difficulty swallowing and was eating a regular diet until midnight last night. She stated good tolerance of foods and liquids denying any swallowing issues. She is currently NPO for procedure. Pt conversed at conversational level w/out deficits noted; pt and NSG/Dtr denied any speech-language deficits.  No further skilled ST services indicated as pt appears at her baseline. Pt agreed. NSG to reconsult if any change in status while admitted.    Orinda Kenner, MS, CCC-SLP Speech Language Pathologist Rehab Services [phone] [facility] 03/02/2020, 11:57 AM

## 2020-03-02 NOTE — Anesthesia Procedure Notes (Signed)
Date/Time: 03/02/2020 1:12 PM Performed by: Ginger Carne, CRNA Pre-anesthesia Checklist: Patient identified, Emergency Drugs available, Suction available, Patient being monitored and Timeout performed Patient Re-evaluated:Patient Re-evaluated prior to induction Oxygen Delivery Method: Nasal cannula Preoxygenation: Pre-oxygenation with 100% oxygen Induction Type: IV induction

## 2020-03-02 NOTE — TOC Transition Note (Signed)
Transition of Care [facility]) - CM/SW Discharge Note   Patient Details  Name: Crystal Benitez MRN: [id number] Date of Birth: Mar 13, 1981  [facility] CM/SW Contact:  Allayne Butcher, RN Phone Number: 03/02/2020, 4:19 PM   Clinical Narrative:    Patient medically cleared for discharge home and OP rehab at [facility] OP Therapy Department.  Referral for OP PT and OT faxed to [phone].     Final next level of care: OP Rehab Barriers to Discharge: Barriers Resolved   Patient Goals and CMS Choice Patient states their goals for this hospitalization and ongoing recovery are:: Find out what is causing the strokes and for her arm to re gain full function CMS Medicare.gov Compare Post Acute Care list provided to:: Patient Choice offered to / list presented to : Patient  Discharge Placement                       Discharge Plan and Services   Discharge Planning Services: CM Consult Post Acute Care Choice: NA            DME Agency: NA       HH Arranged: NA          Social Determinants of Health (SDOH) Interventions     Readmission Risk Interventions No flowsheet data found.

## 2020-03-02 NOTE — Discharge Summary (Signed)
Physician Discharge Summary  Crystal Benitez FAO:[id number] DOB: [birth year] DOA: 02/29/2020  PCP: Dolan Amen, FNP  Admit date: 02/29/2020 Discharge date: 03/02/2020  Recommendations for Outpatient Follow-up:  1. Discharge to home 2. No driving. 3. No smoking 4. Follow up with PCP in 7-10 days 5. Follow up with cardiology for event monitor. 6. Follow up with neurology at stroke clinic with Dr. Pearlean Brownie in 4 weeks.   Follow-up Information    Micki Riley, MD Follow up.   Specialties: Neurology, Radiology Contact information: [address] [place] [address] [phone]        Alwyn Pea, MD .   Specialties: Cardiology, Internal Medicine Contact information: [address] [place] [address] [phone]                Discharge Diagnoses: Principal diagnosis is #1 1. Acute CVA - Rt MCA distribution 2. Hypertension 3. Left upper extremity weakness 4. Tobacco abuse  Discharge Condition: Fair  Disposition: Home  Diet recommendation: Heart healthy  Filed Weights   02/29/20 1304 03/01/20 0029 03/02/20 1241  Weight: 85 kg 73.3 kg 73.3 kg    History of present illness:  Crystal Benitez is a 39 y.o. female with medical history significant for nicotine dependence who presents to the emergency room by EMS for evaluation of left arm weakness/numbness and tingling.  Patient arrived emergency room at about 12:48 PM.  Her last known well was at 9:30 PM and she fell asleep on her left arm which she usually does.  She denies any alcohol or drug use.  She woke up at about 1230 and was unable to move her left arm and states that this has not changed since then.  She denies having any headache, no blurry vision, no lower extremity weakness, no difficulty swallowing.  Due to the persistence of her symptoms her mother called EMS and she was brought into the emergency room where a code stroke was called. She denies having any chest pain, no  shortness of breath, no nausea, no vomiting, no dizziness, no lightheadedness, no abdominal pain or any changes in her bowel habits, no fever, no chills, no cough. Labs show sodium 139, potassium 3.4, chloride 105, bicarb 23, glucose 116, BUN 8, creatinine 0.50, calcium 10.2, alkaline phosphatase 65, albumin 3.6, AST 20, ALT 15, total protein 7.5, white count 6.2, hemoglobin 10.0, hematocrit 31.7, MCV 72.9, RDW 19.3, platelet count 394 Respiratory viral panel is pending at the time of this history and physical CT scan of the head done without contrast shows patchy cytotoxic edema in the posterior right MCA territory.  No associated hemorrhage or mass-effect.  Mild additionally symmetric white matter hypodensity on the right. CT angiogram of the head and neck is negative for large vessel occlusion.  Positive for asymmetric irregularity of the supraclinoid right ICA and right MCA origin.  Associated significant stenosis of right MCA branch occlusion identified.  Moderate stenosis of the nondominant right vertebral artery V2 segment.    ED Course: Patient is a 39 year old female with a past medical history significant for nicotine dependence who presents for evaluation of left arm weakness and is noted to have an acute stroke.  She will be admitted to the hospital for further evaluation.  Hospital Course: The patient is a 39 yr old woman who presented to [facility] ED 3 hr 18 minutes after her last known well status for evaluation of left arm weakness/numbness and tingling. The patient states that she awoke at 12:30  with the above symptoms.   CT head of the head was performed and demonstrated patchy cytotoxic edema in the posterior right MCA territory.   CTA head and neck demonstrated no large vessel occlusion, but did demonstrate irregularity of the supraclinoid right ICA and right MCA origin. There is also associated significant stenosis of right MCA branch occlusion identified as well as moderate  stenosis of the nondominant right vertebral artery V2 segment.   MRI/MRA Brain was positive for scattered acute infarcts of the Right MCA territory with cytotoxic edema and occasional petechial hemorrhage, but no malignant hemorrhagic transformation or mass effect. There is also a contralateral posterior left MCA territory scattered chronic encephalomalacia with occasinoal chronic micro-hemorrhage. Radiology notes that this constellation of MRI and CTA findings is consistent with a recurrent ischemic mechanisms such as PFO or similar etiology.   The patient was not a candidate for tPA due to being outside the time window for this therapy. She was also not a candidate for thrombectomy due to no large vessel obstruction on CTA.  [facility] hospitalists were consulted to admit the patient for further evaluation and treatment. Neurology was consulted. She has been started on aspirin and plavix. Neurology recommends that the patient continued aspirin and plavix both for 3 weeks and then continue on Aspirin 81 mg 1 by mouth daily alone. She will follow up with Dr. Pearlean Brownie in stroke clinic in 4 weeks.  Echocardiogram with bubble study is negative. The patient will be set up with an event monitor. Lipid panel demonstrates LDL of 55/ goal is 70, so no statin is started.  She has been evaluated by PT/OT. They recommend discharge to home with outpatient PT/OT.  Today's assessment: S: The patient is resting comfortably. No new complaints. O: Vitals:  Vitals:   03/02/20 1405 03/02/20 1532  BP: 118/74 111/61  Pulse: 88 84  Resp:  16  Temp:  98.6 F (37 C)  SpO2: 100% 100%   Exam:  Constitutional:  The patient is awake, alert, and oriented x 3. No acute distress.  Respiratory:   No increased work of breathing.  No wheezes, rales, or rhonchi  No tactile fremitus Cardiovascular:   Regular rate and rhythm  No murmurs, ectopy, or gallups.  No lateral PMI. No thrills. Abdomen:   Abdomen is  soft, non-tender, non-distended  No hernias, masses, or organomegaly  Normoactive bowel sounds.  Musculoskeletal:   No cyanosis, clubbing, or edema Skin:   No rashes, lesions, ulcers  palpation of skin: no induration or nodules Neurologic:   CN 2-12 intact  Sensation all 4 extremities intact  Flaccid paralysis of left upper extremity. Psychiatric:   Mood and affect are congruent.    Discharge Instructions  Discharge Instructions    Activity as tolerated - No restrictions   Complete by: As directed    Ambulatory referral to Physical Therapy   Complete by: As directed    Call MD for:   Complete by: As directed    Neurological changes.   Diet - low sodium heart healthy   Complete by: As directed    Discharge instructions   Complete by: As directed    Discharge to home No driving. No smoking Follow up with PCP in 7-10 days Follow up with cardiology for event monitor. Follow up with neurology at stroke clinic with Dr. Pearlean Brownie in 4 weeks.   Increase activity slowly   Complete by: As directed      Allergies as of 03/02/2020   No Known Allergies  Medication List    STOP taking these medications   azithromycin 250 MG tablet Commonly known as: Zithromax Z-Pak   HYDROcodone-acetaminophen 5-325 MG tablet Commonly known as: Norco   ibuprofen 600 MG tablet Commonly known as: ADVIL   ondansetron 4 MG tablet Commonly known as: ZOFRAN     TAKE these medications   albuterol 108 (90 Base) MCG/ACT inhaler Commonly known as: VENTOLIN HFA Inhale 2 puffs into the lungs every 4 (four) hours as needed for wheezing.   aspirin 81 MG chewable tablet Chew 1 tablet (81 mg total) by mouth daily. Start taking on: March 03, 2020   clopidogrel 75 MG tablet Commonly known as: PLAVIX Take 1 tablet (75 mg total) by mouth daily. Start taking on: March 03, 2020   nicotine 7 mg/24hr patch Commonly known as: NICODERM CQ - dosed in mg/24 hr Place 1 patch (7 mg  total) onto the skin daily. Start taking on: March 03, 2020      No Known Allergies  The results of significant diagnostics from this hospitalization (including imaging, microbiology, ancillary and laboratory) are listed below for reference.    Significant Diagnostic Studies: MR BRAIN WO CONTRAST  Result Date: 02/29/2020 CLINICAL DATA:  39 year old female code stroke presentation. Left neck and upper extremity symptoms with right MCA territory infarct on plain CT ASPECTS 8. CTA negative for large vessel occlusion, but positive for mild vessel irregularity at the distal right ICA and MCA origin. EXAM: MRI HEAD WITHOUT CONTRAST TECHNIQUE: Multiplanar, multiecho pulse sequences of the brain and surrounding structures were obtained without intravenous contrast. COMPARISON:  CT head, CTA head and neck earlier today. FINDINGS: Brain: Patchy restricted diffusion throughout the posterior right middle and superior frontal gyri. Confluent involvement of the posterior right occipital lobe (series 7, image 6) and posterior right middle frontal gyrus (image 12) each area about 3 cm diameter. Pre motor, motor and postcentral areas of involvement. Punctate additional right frontal operculum involvement. No abnormal diffusion outside of the right MCA territory. T2 and FLAIR hyperintense cytotoxic edema in the affected areas. Faint petechial hemorrhage identified, including at the perirolandic involvement (series 14, image 44). But no mass effect. But furthermore, there are multifocal small areas of cortical and subcortical encephalomalacia in the contralateral left posterior MCA territory (series 9, image 37). Also faint associated chronic microhemorrhage (series 14, image 41). No other vascular territories appear affected. Deep gray nuclei, brainstem and cerebellum appear normal. No midline shift, mass effect, evidence of mass lesion, ventriculomegaly, extra-axial collection. Cervicomedullary junction and pituitary  are within normal limits. Vascular: Major intracranial vascular flow voids are preserved. Skull and upper cervical spine: Negative. Sinuses/Orbits: Negative. Other: Mastoids are clear. Grossly normal visible internal auditory structures. IMPRESSION: 1. Positive for scattered acute infarcts in the Right MCA territory with cytotoxic edema and occasional petechial hemorrhage, but no malignant hemorrhagic transformation or mass effect. 2. However, contralateral posterior Left MCA territory scattered chronic encephalomalacia is noted, also with occasional chronic micro-hemorrhage. 3. Therefore this constellation of MRI and CTA findings is suspicious for recurrent ischemia secondary to a mechanism such as PFO or similar risk-factor. Electronically Signed   By: Odessa Fleming M.D.   On: 02/29/2020 17:35   ECHOCARDIOGRAM COMPLETE BUBBLE STUDY  Result Date: 03/01/2020    ECHOCARDIOGRAM REPORT   Patient Name:   Crystal Benitez Date of Exam: 03/01/2020 Medical Rec #:  [id number]      Height:       66.0 in Accession #:    [id number]  Weight:       161.6 lb Date of Birth:  Dec 03, 1980       BSA:          1.827 m Patient Age:    39 years       BP:           123/78 mmHg Patient Gender: F              HR:           71 bpm. Exam Location:  [facility] Procedure: 2D Echo and Saline Contrast Bubble Study Indications:     Stroke I63.9  History:         Patient has no prior history of Echocardiogram examinations.  Sonographer:     Wonda Cerise RDCS Referring Phys:  [id number] Terrilee Files [facility] Diagnosing Phys: Julien Nordmann MD IMPRESSIONS  1. Left ventricular ejection fraction, by estimation, is 60 to 65%. The left ventricle has normal function. The left ventricle has no regional wall motion abnormalities. Left ventricular diastolic parameters are consistent with Grade I diastolic dysfunction (impaired relaxation).  2. Right ventricular systolic function is normal. The right ventricular size is normal. Tricuspid regurgitation signal is  inadequate for assessing PA pressure.  3. Agitated saline contrast bubble study was negative, with no evidence of any interatrial shunt. FINDINGS  Left Ventricle: Left ventricular ejection fraction, by estimation, is 60 to 65%. The left ventricle has normal function. The left ventricle has no regional wall motion abnormalities. The left ventricular internal cavity size was normal in size. There is  no left ventricular hypertrophy. Left ventricular diastolic parameters are consistent with Grade I diastolic dysfunction (impaired relaxation). Right Ventricle: The right ventricular size is normal. No increase in right ventricular wall thickness. Right ventricular systolic function is normal. Tricuspid regurgitation signal is inadequate for assessing PA pressure. Left Atrium: Left atrial size was normal in size. Right Atrium: Right atrial size was normal in size. Pericardium: There is no evidence of pericardial effusion. Mitral Valve: The mitral valve is normal in structure. No evidence of mitral valve regurgitation. No evidence of mitral valve stenosis. Tricuspid Valve: The tricuspid valve is normal in structure. Tricuspid valve regurgitation is not demonstrated. No evidence of tricuspid stenosis. Aortic Valve: The aortic valve is normal in structure. Aortic valve regurgitation is not visualized. No aortic stenosis is present. Aortic valve peak gradient measures 8.6 mmHg. Pulmonic Valve: The pulmonic valve was normal in structure. Pulmonic valve regurgitation is not visualized. No evidence of pulmonic stenosis. Aorta: The aortic root is normal in size and structure. Venous: The inferior vena cava is normal in size with greater than 50% respiratory variability, suggesting right atrial pressure of 3 mmHg. IAS/Shunts: No atrial level shunt detected by color flow Doppler. Agitated saline contrast was given intravenously to evaluate for intracardiac shunting. Agitated saline contrast bubble study was negative, with no  evidence of any interatrial shunt.  LEFT VENTRICLE PLAX 2D LVIDd:         4.69 cm  Diastology LVIDs:         3.30 cm  LV e' medial:    6.74 cm/s LV PW:         1.15 cm  LV E/e' medial:  10.9 LV IVS:        1.06 cm  LV e' lateral:   7.29 cm/s LVOT diam:     2.20 cm  LV E/e' lateral: 10.1 LV SV:         70 LV SV Index:  38 LVOT Area:     3.80 cm  RIGHT VENTRICLE RV Basal diam:  2.51 cm RV S prime:     13.90 cm/s TAPSE (M-mode): 2.5 cm LEFT ATRIUM           Index       RIGHT ATRIUM          Index LA diam:      2.50 cm 1.37 cm/m  RA Area:     8.73 cm LA Vol (A2C): 57.6 ml 31.53 ml/m RA Volume:   15.70 ml 8.60 ml/m LA Vol (A4C): 19.3 ml 10.57 ml/m  AORTIC VALVE                PULMONIC VALVE AV Area (Vmax): 2.43 cm    PV Vmax:       0.87 m/s AV Vmax:        147.00 cm/s PV Peak grad:  3.0 mmHg AV Peak Grad:   8.6 mmHg LVOT Vmax:      94.00 cm/s LVOT Vmean:     63.800 cm/s LVOT VTI:       0.184 m  AORTA Ao Root diam: 3.40 cm Ao Asc diam:  2.90 cm MITRAL VALVE MV Area (PHT): 5.16 cm    SHUNTS MV Decel Time: 147 msec    Systemic VTI:  0.18 m MV E velocity: 73.70 cm/s  Systemic Diam: 2.20 cm MV A velocity: 66.80 cm/s MV E/A ratio:  1.10 Julien Nordmann MD Electronically signed by Julien Nordmann MD Signature Date/Time: 03/01/2020/2:28:40 PM    Final    CT HEAD CODE STROKE WO CONTRAST  Result Date: 02/29/2020 CLINICAL DATA:  Code stroke. 39 year old female with pain and numbness radiating from the left neck to the arm and hand. EXAM: CT HEAD WITHOUT CONTRAST TECHNIQUE: Contiguous axial images were obtained from the base of the skull through the vertex without intravenous contrast. COMPARISON:  None. FINDINGS: Brain: Patchy cytotoxic edema right posterior frontal lobe and parietal lobe, right middle and superior frontal gyri. Perirolandic cortex involvement. Pre motor and postcentral involvement also. No associated hemorrhage. No significant mass effect. Superimposed mild additional asymmetric white matter  hypodensity in both hemispheres. Elsewhere gray-white matter differentiation within normal limits. No chronic cortical encephalomalacia identified. No acute intracranial hemorrhage identified. No ventriculomegaly. No midline shift, mass effect, or evidence of intracranial mass lesion. Vascular: No suspicious intracranial vascular hyperdensity. Skull: Negative. Sinuses/Orbits: Visualized paranasal sinuses and mastoids are clear. Other: Mildly Disconjugate gaze. Otherwise negative orbit and scalp soft tissues. ASPECTS [facility] Stroke Program Early CT Score) Total score (0-10 with 10 being normal): 8 (abnormal right M 5 and M6 segments IMPRESSION: 1. Patchy cytotoxic edema in the posterior right MCA territory. ASPECTS 8. 2. No associated hemorrhage or mass effect. Mild additional asymmetric white matter hypodensity on the right. 3. These results were communicated to Dr. Derry Lory at 3:44 pm on 02/29/2020 by text page via the [facility] messaging system. Electronically Signed   By: Odessa Fleming M.D.   On: 02/29/2020 15:45   CT ANGIO HEAD CODE STROKE  Result Date: 02/29/2020 CLINICAL DATA:  39 year old female code stroke presentation. Left upper extremity pain and numbness. EXAM: CT ANGIOGRAPHY HEAD AND NECK TECHNIQUE: Multidetector CT imaging of the head and neck was performed using the standard protocol during bolus administration of intravenous contrast. Multiplanar CT image reconstructions and MIPs were obtained to evaluate the vascular anatomy. Carotid stenosis measurements (when applicable) are obtained utilizing NASCET criteria, using the distal internal carotid diameter as the denominator. CONTRAST:  75mL OMNIPAQUE IOHEXOL 350 MG/ML SOLN COMPARISON:  Plain head CT 1535 hours today. FINDINGS: CTA NECK Skeleton: Scattered carious dentition. Chronic C5-C6 disc and endplate degeneration. No acute osseous abnormality identified. Upper chest: Mild paraseptal emphysema in the lung apices. Other neck: Within normal limits.  Aortic arch: 3 vessel arch configuration.  No arch atherosclerosis. Right carotid system: Negative. Left carotid system: Negative aside from mild tortuosity. Vertebral arteries: Negative proximal right subclavian artery and right vertebral artery origin. Proximal right V2 segment soft plaque and moderate stenosis suspected on series 5, image 459. Otherwise the right vertebral is patent to the skull base and within normal limits. Proximal left subclavian artery and origin of the mildly dominant left vertebral arteries are normal. The left vertebral is patent and normal to the skull base. CTA HEAD Posterior circulation: Mildly dominant left V4 segment. Patent distal vertebral arteries to the vertebrobasilar junction without plaque or stenosis. Normal right PICA origin. Dominant appearing left AICA. Patent basilar artery without stenosis. SCA and PCA origins are patent. Posterior communicating arteries are diminutive or absent. Bilateral PCA branches are within normal limits. Anterior circulation: Both ICA siphons are patent. The left siphon is normal. The right siphon appears smaller, remains patent to the terminus, but with mild supraclinoid segment irregularity (series 8, image 142. Irregularity continues to the right MCA origin which is patent (series 10, image 18). The right MCA M1 segment remains patent without significant stenosis. Right MCA bifurcation is patent. Right MCA branches are within normal limits. No branch occlusion identified. Left ACA A1 segment is dominant, and there is azygos type ACA anatomy from the left. Anterior communicating artery and ACA branches appear within normal limits. Left MCA origin and M1 segment appear more normal than the right. Left MCA bifurcation is patent. Left MCA branches are within normal limits. Venous sinuses: Patent. Dominant right transverse and sigmoid sinuses. Anatomic variants: Mildly dominant left vertebral artery. Dominant right transverse and sigmoid sinuses.  Left ACA A1 segment is dominant, and there is azygos type ACA anatomy from the left. Review of the MIP images confirms the above findings IMPRESSION: 1. Negative for large vessel occlusion. Positive for asymmetric irregularity of the supraclinoid right ICA and right MCA origin. Associated significant stenosis or right MCA branch occlusion identified. Preliminary results of the above discussed by telephone with Dr. Erick Blinks on 02/29/2020 at 1549 hours. 2. 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 But no other atherosclerosis or arterial abnormality identified in the head or neck. Electronically Signed   By: Odessa Fleming M.D.   On: 02/29/2020 16:29   CT ANGIO NECK CODE STROKE  Result Date: 02/29/2020 CLINICAL DATA:  39 year old female code stroke presentation. Left upper extremity pain and numbness. EXAM: CT ANGIOGRAPHY HEAD AND NECK TECHNIQUE: Multidetector CT imaging of the head and neck was performed using the standard protocol during bolus administration of intravenous contrast. Multiplanar CT image reconstructions and MIPs were obtained to evaluate the vascular anatomy. Carotid stenosis measurements (when applicable) are obtained utilizing NASCET criteria, using the distal internal carotid diameter as the denominator. CONTRAST:  75mL OMNIPAQUE IOHEXOL 350 MG/ML SOLN COMPARISON:  Plain head CT 1535 hours today. FINDINGS: CTA NECK Skeleton: Scattered carious dentition. Chronic C5-C6 disc and endplate degeneration. No acute osseous abnormality identified. Upper chest: Mild paraseptal emphysema in the lung apices. Other neck: Within normal limits. Aortic arch: 3 vessel arch configuration.  No arch atherosclerosis. Right carotid system: Negative. Left carotid system: Negative aside from mild tortuosity. Vertebral arteries:  Negative proximal right subclavian artery and right vertebral artery origin. Proximal right V2 segment soft plaque and moderate stenosis suspected on series  5, image 459. Otherwise the right vertebral is patent to the skull base and within normal limits. Proximal left subclavian artery and origin of the mildly dominant left vertebral arteries are normal. The left vertebral is patent and normal to the skull base. CTA HEAD Posterior circulation: Mildly dominant left V4 segment. Patent distal vertebral arteries to the vertebrobasilar junction without plaque or stenosis. Normal right PICA origin. Dominant appearing left AICA. Patent basilar artery without stenosis. SCA and PCA origins are patent. Posterior communicating arteries are diminutive or absent. Bilateral PCA branches are within normal limits. Anterior circulation: Both ICA siphons are patent. The left siphon is normal. The right siphon appears smaller, remains patent to the terminus, but with mild supraclinoid segment irregularity (series 8, image 142. Irregularity continues to the right MCA origin which is patent (series 10, image 18). The right MCA M1 segment remains patent without significant stenosis. Right MCA bifurcation is patent. Right MCA branches are within normal limits. No branch occlusion identified. Left ACA A1 segment is dominant, and there is azygos type ACA anatomy from the left. Anterior communicating artery and ACA branches appear within normal limits. Left MCA origin and M1 segment appear more normal than the right. Left MCA bifurcation is patent. Left MCA branches are within normal limits. Venous sinuses: Patent. Dominant right transverse and sigmoid sinuses. Anatomic variants: Mildly dominant left vertebral artery. Dominant right transverse and sigmoid sinuses. Left ACA A1 segment is dominant, and there is azygos type ACA anatomy from the left. Review of the MIP images confirms the above findings IMPRESSION: 1. Negative for large vessel occlusion. Positive for asymmetric irregularity of the supraclinoid right ICA and right MCA origin. Associated significant stenosis or right MCA branch  occlusion identified. Preliminary results of the above discussed by telephone with Dr. Erick BlinksSALMAN KHALIQDINA on 02/29/2020 at 1549 hours. 2. 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 But no other atherosclerosis or arterial abnormality identified in the head or neck. Electronically Signed   By: Odessa FlemingH  Hall M.D.   On: 02/29/2020 16:29    Microbiology: Recent Results (from the past 240 hour(s))  Resp Panel by RT-PCR (Flu A&B, Covid) Nasopharyngeal Swab     Status: None   Collection Time: 02/29/20  4:02 PM   Specimen: Nasopharyngeal Swab; Nasopharyngeal(NP) swabs in vial transport medium  Result Value Ref Range Status   SARS Coronavirus 2 by RT PCR NEGATIVE NEGATIVE Final    Comment: (NOTE) SARS-CoV-2 target nucleic acids are NOT DETECTED.  The SARS-CoV-2 RNA is generally detectable in upper respiratory specimens during the acute phase of infection. The lowest concentration of SARS-CoV-2 viral copies this assay can detect is 138 copies/mL. A negative result does not preclude SARS-Cov-2 infection and should not be used as the sole basis for treatment or other patient management decisions. A negative result may occur with  improper specimen collection/handling, submission of specimen other than nasopharyngeal swab, presence of viral mutation(s) within the areas targeted by this assay, and inadequate number of viral copies(<138 copies/mL). A negative result must be combined with clinical observations, patient history, and epidemiological information. The expected result is Negative.  Fact Sheet for Patients:  BloggerCourse.comhttps://www.fda.gov/media/152166/download  Fact Sheet for Healthcare Providers:  SeriousBroker.ithttps://www.fda.gov/media/152162/download  This test is no t yet approved or cleared by the [place] FDA and  has been authorized for detection and/or diagnosis of SARS-CoV-2 by  FDA under an Emergency Use Authorization (EUA). This EUA will remain  in effect (meaning this test  can be used) for the duration of the COVID-19 declaration under Section 564(b)(1) of the Act, 21 U.S.C.section 360bbb-3(b)(1), unless the authorization is terminated  or revoked sooner.       Influenza A by PCR NEGATIVE NEGATIVE Final   Influenza B by PCR NEGATIVE NEGATIVE Final    Comment: (NOTE) The Xpert Xpress SARS-CoV-2/FLU/RSV plus assay is intended as an aid in the diagnosis of influenza from Nasopharyngeal swab specimens and should not be used as a sole basis for treatment. Nasal washings and aspirates are unacceptable for Xpert Xpress SARS-CoV-2/FLU/RSV testing.  Fact Sheet for Patients: BloggerCourse.com  Fact Sheet for Healthcare Providers: SeriousBroker.it  This test is not yet approved or cleared by the [place] FDA and has been authorized for detection and/or diagnosis of SARS-CoV-2 by FDA under an Emergency Use Authorization (EUA). This EUA will remain in effect (meaning this test can be used) for the duration of the COVID-19 declaration under Section 564(b)(1) of the Act, 21 U.S.C. section 360bbb-3(b)(1), unless the authorization is terminated or revoked.  Performed at [facility], [address] [place], [place] [address]      Labs: Basic Metabolic Panel: Recent Labs  Lab 02/29/20 1308 03/02/20 0426  NA 139 137  K 3.4* 3.9  CL 105 104  CO2 23 25  GLUCOSE 116* 96  BUN 8 8  CREATININE 0.50 0.54  CALCIUM 10.2 9.6   Liver Function Tests: Recent Labs  Lab 02/29/20 1308 03/02/20 0426  AST 20 15  ALT 15 12  ALKPHOS 65 59  BILITOT 0.5 0.4  PROT 7.5 6.8  ALBUMIN 3.6 3.3*   No results for input(s): LIPASE, AMYLASE in the last 168 hours. No results for input(s): AMMONIA in the last 168 hours. CBC: Recent Labs  Lab 02/29/20 1308 03/02/20 0426  WBC 6.2 4.8  NEUTROABS  --  2.0  HGB 10.0* 9.6*  HCT 31.7* 31.4*  MCV 72.9* 73.4*  PLT 394 390   Cardiac Enzymes: No results for  input(s): CKTOTAL, CKMB, CKMBINDEX, TROPONINI in the last 168 hours. BNP: BNP (last 3 results) No results for input(s): BNP in the last 8760 hours.  ProBNP (last 3 results) No results for input(s): PROBNP in the last 8760 hours.  CBG: No results for input(s): GLUCAP in the last 168 hours.  Active Problems:   Acute CVA (cerebrovascular accident) (HCC)   Nicotine dependence  Time coordinating discharge: 38 minutes.  Signed:        Jisselle Poth, DO [facility]  03/02/2020, 3:48 PM

## 2020-03-02 NOTE — TOC Initial Note (Signed)
Transition of Care [facility]) - Initial/Assessment Note    Patient Details  Name: Crystal Benitez MRN: [id number] Date of Birth: 1980-12-06  Transition of Care [facility]) CM/SW Contact:    Allayne Butcher, RN Phone Number: 03/02/2020, 1:53 PM  Clinical Narrative:                 Patient admitted to the hospital with stroke.  RNCM was able to meet with patient at the bedside.  Patient's daughter is also at the bedside.  Patient reports that she is from home with her fiance.  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  Patient has left sided weakness and her left arm still feels numb and limp.  PT is recommending outpatient physical therapy and patient agrees.  She chooses [facility] outpatient therapy.  Referral will be faxed to [facility] OP therapy before patient discharges.  Patient reports that she does not have a PCP, RNCM instructed patient to call her Medicaid plan and tell them she needs a PCP, they can help her get set up and assign PCP.  Patient will not be able to drive once discharged but her fiance and daughter's will be able to help with transportation.  TOC will cont to follow during hospitalization.   Expected Discharge Plan: OP Rehab Barriers to Discharge: Continued Medical Work up   Patient Goals and CMS Choice Patient states their goals for this hospitalization and ongoing recovery are:: Find out what is causing the strokes and for her arm to re gain full function CMS Medicare.gov Compare Post Acute Care list provided to:: Patient Choice offered to / list presented to : Patient  Expected Discharge Plan and Services Expected Discharge Plan: OP Rehab   Discharge Planning Services: CM Consult Post Acute Care Choice: NA Living arrangements for the past 2 months: Single Family Home                   DME Agency: NA       HH Arranged: NA          Prior Living Arrangements/Services Living arrangements for the past 2 months: Single Family Home Lives with::  Significant Other Patient language and need for interpreter reviewed:: Yes Do you feel safe going back to the place where you live?: Yes      Need for Family Participation in Patient Care: Yes (Comment) (stroke) Care giver support system in place?: Yes (comment) (fiance and daughters)   Criminal Activity/Legal Involvement Pertinent to Current Situation/Hospitalization: No - Comment as needed  Activities of Daily Living Home Assistive Devices/Equipment: None ADL Screening (condition at time of admission) Patient's cognitive ability adequate to safely complete daily activities?: Yes Is the patient deaf or have difficulty hearing?: No Does the patient have difficulty seeing, even when wearing glasses/contacts?: No Does the patient have difficulty concentrating, remembering, or making decisions?: No Patient able to express need for assistance with ADLs?: Yes Does the patient have difficulty dressing or bathing?: Yes Independently performs ADLs?: No Communication: Independent Dressing (OT): Needs assistance Does the patient have difficulty walking or climbing stairs?: Yes Weakness of Legs: Left Weakness of Arms/Hands: Left  Permission Sought/Granted Permission sought to share information with : Case Manager,Family Electrical engineer Permission granted to share information with : Yes, Verbal Permission Granted     Permission granted to share info w AGENCY: [facility] outpatient physical therapy        Emotional Assessment Appearance:: Appears stated age Attitude/Demeanor/Rapport: Engaged Affect (typically observed): Accepting Orientation: :  Oriented to Self,Oriented to Place,Oriented to  Time,Oriented to Situation Alcohol / Substance Use: Not Applicable Psych Involvement: No (comment)  Admission diagnosis:  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 Acute CVA (cerebrovascular accident) (HCC) 02/29/2020  . Nicotine dependence 02/29/2020   PCP:  Dolan Amen, FNP Pharmacy:   [id number] [place], [place] - [phone] AT [facility] & [address] [place] [address] Phone: [phone] Fax: [phone]     Social Determinants of Health (SDOH) Interventions    Readmission Risk Interventions No flowsheet data found.

## 2020-03-02 NOTE — Transfer of Care (Signed)
Immediate Anesthesia Transfer of Care Note  Patient: Crystal Benitez  Procedure(s) Performed: TRANSESOPHAGEAL ECHOCARDIOGRAM (TEE) (N/A )  Patient Location: PACU  Anesthesia Type:General  Level of Consciousness: awake, alert  and oriented  Airway & Oxygen Therapy: Patient Spontanous Breathing  Post-op Assessment: Report given to RN and Post -op Vital signs reviewed and stable  Post vital signs: Reviewed and stable  Last Vitals:  Vitals Value Taken Time  BP 118/78 03/02/20 1333  Temp    Pulse 99 03/02/20 1333  Resp 16 03/02/20 1333  SpO2 100 % 03/02/20 1333    Last Pain:  Vitals:   03/02/20 1141  TempSrc: Axillary  PainSc:          Complications: No complications documented.

## 2020-03-03 ENCOUNTER — Encounter: Payer: Self-pay | Admitting: Cardiology

## 2020-03-03 LAB — ANCA TITERS
Atypical P-ANCA titer: <1:20 {titer}
C-ANCA: <1:20 {titer}
P-ANCA: <1:20 {titer}

## 2020-03-03 LAB — FACTOR 5 LEIDEN

## 2020-03-03 LAB — PROTEIN C ACTIVITY: Protein C Activity: 143 % (ref 73–180)

## 2020-03-03 LAB — PROTEIN S ACTIVITY: Protein S Activity: 74 % (ref 63–140)

## 2020-03-04 LAB — MTHFR DNA ANALYSIS

## 2020-03-10 LAB — ANTIPHOSPHOLIPID SYNDROME EVAL, BLD
Anticardiolipin IgA: <9 APL U/mL (ref 0–11)
Anticardiolipin IgG: <9 GPL U/mL (ref 0–14)
Anticardiolipin IgM: <9 MPL U/mL (ref 0–12)
DRVVT: 26.9 s (ref 0.0–47.0)
PTT Lupus Anticoagulant: 30.3 s (ref 0.0–51.9)
Phosphatydalserine, IgA: <1 APS Units (ref 0–19)
Phosphatydalserine, IgG: 9 Units (ref 0–30)
Phosphatydalserine, IgM: 16 Units (ref 0–30)

## 2020-03-16 NOTE — Anesthesia Preprocedure Evaluation (Signed)
Anesthesia Evaluation  Patient identified by MRN, date of birth, ID band Patient awake    Reviewed: Allergy & Precautions, H&P , NPO status , Patient's Chart, lab work & pertinent test results, reviewed documented beta blocker date and time   Airway Mallampati: II   Neck ROM: full    Dental  (+) Poor Dentition   Pulmonary neg pulmonary ROS, Current Smoker and Patient abstained from smoking.,    Pulmonary exam normal        Cardiovascular Exercise Tolerance: Poor negative cardio ROS Normal cardiovascular exam Rhythm:regular Rate:Normal     Neuro/Psych CVA negative psych ROS   GI/Hepatic negative GI ROS, Neg liver ROS,   Endo/Other  negative endocrine ROS  Renal/GU negative Renal ROS  negative genitourinary   Musculoskeletal   Abdominal   Peds  Hematology negative hematology ROS (+)   Anesthesia Other Findings History reviewed. No pertinent past medical history. Past Surgical History: No date: CHOLECYSTECTOMY 03/02/2020: TEE WITHOUT CARDIOVERSION; N/A     Comment:  Procedure: TRANSESOPHAGEAL ECHOCARDIOGRAM (TEE);                Surgeon: Dalia Heading, MD;  Location: [facility] ORS;                Service: Cardiovascular;  Laterality: N/A; No date: TUBAL LIGATION BMI    Body Mass Index: 26.08 kg/m     Reproductive/Obstetrics negative OB ROS                             Anesthesia Physical Anesthesia Plan  ASA: III  Anesthesia Plan: General   Post-op Pain Management:    Induction:   PONV Risk Score and Plan:   Airway Management Planned:   Additional Equipment:   Intra-op Plan:   Post-operative Plan:   Informed Consent: I have reviewed the patients History and Physical, chart, labs and discussed the procedure including the risks, benefits and alternatives for the proposed anesthesia with the patient or authorized representative who has indicated his/her understanding and  acceptance.     Dental Advisory Given  Plan Discussed with: CRNA  Anesthesia Plan Comments:         Anesthesia Quick Evaluation

## 2020-03-16 NOTE — Anesthesia Postprocedure Evaluation (Signed)
Anesthesia Post Note  Patient: Crystal Benitez  Procedure(s) Performed: TRANSESOPHAGEAL ECHOCARDIOGRAM (TEE) (N/A )  Patient location during evaluation: PACU Anesthesia Type: General Level of consciousness: awake and alert Pain management: pain level controlled Vital Signs Assessment: post-procedure vital signs reviewed and stable Respiratory status: spontaneous breathing, nonlabored ventilation, respiratory function stable and patient connected to nasal cannula oxygen Cardiovascular status: blood pressure returned to baseline and stable Postop Assessment: no apparent nausea or vomiting Anesthetic complications: no   No complications documented.   Last Vitals:  Vitals:   03/02/20 1405 03/02/20 1532  BP: 118/74 111/61  Pulse: 88 84  Resp:  16  Temp:  37 C  SpO2: 100% 100%    Last Pain:  Vitals:   03/02/20 1532  TempSrc: Oral  PainSc:                  Yevette Edwards

## 2020-04-28 ENCOUNTER — Encounter: Payer: Self-pay | Admitting: Neurology

## 2020-04-28 ENCOUNTER — Inpatient Hospital Stay: Payer: Self-pay | Admitting: Neurology

## 2021-11-01 ENCOUNTER — Ambulatory Visit: Payer: Medicare Other | Admitting: Speech Pathology

## 2021-11-01 ENCOUNTER — Ambulatory Visit: Payer: Medicare Other | Admitting: Occupational Therapy

## 2021-11-03 ENCOUNTER — Ambulatory Visit: Payer: Medicare Other | Admitting: Occupational Therapy

## 2021-11-03 ENCOUNTER — Ambulatory Visit: Payer: Medicare Other | Admitting: Speech Pathology

## 2021-11-08 ENCOUNTER — Ambulatory Visit: Payer: Medicare Other | Attending: Neurology | Admitting: Occupational Therapy

## 2021-11-10 ENCOUNTER — Encounter: Payer: Self-pay | Admitting: Occupational Therapy

## 2021-11-10 ENCOUNTER — Ambulatory Visit: Payer: Medicare Other | Admitting: Speech Pathology

## 2021-11-10 ENCOUNTER — Ambulatory Visit: Payer: Medicare Other | Admitting: Occupational Therapy

## 2021-11-10 NOTE — Therapy (Signed)
[place] [facility] MAIN [facility] SERVICES [address] [place], [place], [address] Phone: [phone]   Fax:  [phone]  Patient Details  Name: Crystal Benitez MRN: [id number] Date of Birth: 03-20-1981 Referring Provider:  No ref. provider found  Encounter Date: 11/10/2021  Pt. did not arrived for her Initial OT Evaluation this morning. Attempted to reach out to the Pt. via telephone. A voicemail message  was left with our rehab clinic's contact telephone number.   Olegario Messier, OT 11/10/2021, 12:12 PM  [place] [facility] MAIN [facility] SERVICES [address] [place], [place], [address] Phone: [phone]   Fax:  [phone]

## 2021-11-15 ENCOUNTER — Encounter: Payer: Medicare Other | Admitting: Speech Pathology

## 2021-11-15 ENCOUNTER — Encounter: Payer: Medicare Other | Admitting: Occupational Therapy

## 2021-11-17 ENCOUNTER — Encounter: Payer: Medicare Other | Admitting: Occupational Therapy

## 2021-11-17 ENCOUNTER — Encounter: Payer: Medicare Other | Admitting: Speech Pathology

## 2021-11-23 ENCOUNTER — Encounter: Payer: Medicare Other | Admitting: Speech Pathology

## 2021-11-25 ENCOUNTER — Encounter: Payer: Medicare Other | Admitting: Occupational Therapy

## 2021-11-29 ENCOUNTER — Encounter: Payer: Medicare Other | Admitting: Speech Pathology

## 2021-11-29 ENCOUNTER — Encounter: Payer: Medicare Other | Admitting: Occupational Therapy

## 2021-12-01 ENCOUNTER — Encounter: Payer: Medicare Other | Admitting: Speech Pathology

## 2021-12-01 ENCOUNTER — Encounter: Payer: Medicare Other | Admitting: Occupational Therapy

## 2021-12-06 ENCOUNTER — Encounter: Payer: Medicare Other | Admitting: Speech Pathology

## 2021-12-08 ENCOUNTER — Encounter: Payer: Medicare Other | Admitting: Speech Pathology

## 2021-12-08 ENCOUNTER — Encounter: Payer: Medicare Other | Admitting: Occupational Therapy

## 2021-12-13 ENCOUNTER — Ambulatory Visit: Payer: Medicare Other | Admitting: Physical Therapy

## 2021-12-13 ENCOUNTER — Encounter: Payer: Medicare Other | Admitting: Speech Pathology

## 2021-12-14 ENCOUNTER — Encounter (INDEPENDENT_AMBULATORY_CARE_PROVIDER_SITE_OTHER): Payer: Medicaid Other | Admitting: Vascular Surgery

## 2021-12-15 ENCOUNTER — Encounter: Payer: Medicare Other | Admitting: Occupational Therapy

## 2021-12-15 ENCOUNTER — Ambulatory Visit: Payer: Medicare Other | Admitting: Physical Therapy

## 2021-12-20 ENCOUNTER — Encounter: Payer: Medicare Other | Admitting: Occupational Therapy

## 2021-12-20 ENCOUNTER — Ambulatory Visit: Payer: Medicare Other | Admitting: Physical Therapy

## 2021-12-22 ENCOUNTER — Encounter: Payer: Medicare Other | Admitting: Occupational Therapy

## 2021-12-22 ENCOUNTER — Ambulatory Visit: Payer: Medicare Other | Admitting: Physical Therapy

## 2021-12-27 ENCOUNTER — Encounter: Payer: Medicare Other | Admitting: Speech Pathology

## 2021-12-27 ENCOUNTER — Ambulatory Visit: Payer: Medicare Other | Admitting: Physical Therapy

## 2021-12-29 ENCOUNTER — Ambulatory Visit: Payer: Medicare Other | Admitting: Physical Therapy

## 2021-12-29 ENCOUNTER — Encounter: Payer: Medicare Other | Admitting: Occupational Therapy

## 2022-01-03 ENCOUNTER — Encounter: Payer: Medicare Other | Admitting: Occupational Therapy

## 2022-01-03 ENCOUNTER — Encounter: Payer: Medicare Other | Admitting: Speech Pathology

## 2022-01-05 ENCOUNTER — Ambulatory Visit: Payer: Medicare Other

## 2022-01-05 ENCOUNTER — Encounter: Payer: Medicare Other | Admitting: Speech Pathology

## 2022-01-05 ENCOUNTER — Encounter: Payer: Medicare Other | Admitting: Occupational Therapy

## 2022-01-31 DIAGNOSIS — I480 Paroxysmal atrial fibrillation: Secondary | ICD-10-CM | POA: Diagnosis not present

## 2022-01-31 DIAGNOSIS — Z8673 Personal history of transient ischemic attack (TIA), and cerebral infarction without residual deficits: Secondary | ICD-10-CM | POA: Diagnosis not present

## 2022-01-31 DIAGNOSIS — E782 Mixed hyperlipidemia: Secondary | ICD-10-CM | POA: Diagnosis not present

## 2022-01-31 DIAGNOSIS — Z720 Tobacco use: Secondary | ICD-10-CM | POA: Diagnosis not present

## 2022-02-04 ENCOUNTER — Ambulatory Visit (INDEPENDENT_AMBULATORY_CARE_PROVIDER_SITE_OTHER): Payer: Medicare HMO | Admitting: Vascular Surgery

## 2022-02-04 ENCOUNTER — Encounter (INDEPENDENT_AMBULATORY_CARE_PROVIDER_SITE_OTHER): Payer: Self-pay | Admitting: Vascular Surgery

## 2022-02-04 VITALS — BP 121/79 | HR 93 | Resp 16 | Wt 164.6 lb

## 2022-02-04 DIAGNOSIS — I639 Cerebral infarction, unspecified: Secondary | ICD-10-CM

## 2022-02-04 NOTE — Progress Notes (Signed)
Patient had to leave before I could see her in the office.  I was on call and had surgery to do in the morning and was delayed.

## 2022-02-15 ENCOUNTER — Encounter (INDEPENDENT_AMBULATORY_CARE_PROVIDER_SITE_OTHER): Payer: Medicare Other | Admitting: Vascular Surgery

## 2022-02-25 DIAGNOSIS — F329 Major depressive disorder, single episode, unspecified: Secondary | ICD-10-CM | POA: Diagnosis not present

## 2022-02-25 DIAGNOSIS — R32 Unspecified urinary incontinence: Secondary | ICD-10-CM | POA: Diagnosis not present

## 2022-02-25 DIAGNOSIS — R269 Unspecified abnormalities of gait and mobility: Secondary | ICD-10-CM | POA: Diagnosis not present

## 2022-02-25 DIAGNOSIS — E785 Hyperlipidemia, unspecified: Secondary | ICD-10-CM | POA: Diagnosis not present

## 2022-02-25 DIAGNOSIS — Z720 Tobacco use: Secondary | ICD-10-CM | POA: Diagnosis not present

## 2022-02-25 DIAGNOSIS — Z8249 Family history of ischemic heart disease and other diseases of the circulatory system: Secondary | ICD-10-CM | POA: Diagnosis not present

## 2022-02-25 DIAGNOSIS — F4323 Adjustment disorder with mixed anxiety and depressed mood: Secondary | ICD-10-CM | POA: Diagnosis not present

## 2022-02-25 DIAGNOSIS — G629 Polyneuropathy, unspecified: Secondary | ICD-10-CM | POA: Diagnosis not present

## 2022-02-25 DIAGNOSIS — F419 Anxiety disorder, unspecified: Secondary | ICD-10-CM | POA: Diagnosis not present

## 2022-02-25 DIAGNOSIS — F4321 Adjustment disorder with depressed mood: Secondary | ICD-10-CM | POA: Diagnosis not present

## 2022-02-25 DIAGNOSIS — Z809 Family history of malignant neoplasm, unspecified: Secondary | ICD-10-CM | POA: Diagnosis not present

## 2022-02-25 DIAGNOSIS — I10 Essential (primary) hypertension: Secondary | ICD-10-CM | POA: Diagnosis not present

## 2022-02-25 DIAGNOSIS — I69328 Other speech and language deficits following cerebral infarction: Secondary | ICD-10-CM | POA: Diagnosis not present

## 2022-02-25 DIAGNOSIS — Z008 Encounter for other general examination: Secondary | ICD-10-CM | POA: Diagnosis not present

## 2022-02-25 DIAGNOSIS — Z7901 Long term (current) use of anticoagulants: Secondary | ICD-10-CM | POA: Diagnosis not present

## 2022-02-25 DIAGNOSIS — I70209 Unspecified atherosclerosis of native arteries of extremities, unspecified extremity: Secondary | ICD-10-CM | POA: Diagnosis not present

## 2022-02-25 DIAGNOSIS — G44209 Tension-type headache, unspecified, not intractable: Secondary | ICD-10-CM | POA: Diagnosis not present

## 2022-03-08 ENCOUNTER — Encounter (INDEPENDENT_AMBULATORY_CARE_PROVIDER_SITE_OTHER): Payer: Medicare HMO | Admitting: Vascular Surgery

## 2022-03-08 ENCOUNTER — Encounter (INDEPENDENT_AMBULATORY_CARE_PROVIDER_SITE_OTHER): Payer: Self-pay

## 2022-04-08 ENCOUNTER — Encounter (INDEPENDENT_AMBULATORY_CARE_PROVIDER_SITE_OTHER): Payer: Medicare HMO | Admitting: Vascular Surgery

## 2022-04-26 ENCOUNTER — Encounter (INDEPENDENT_AMBULATORY_CARE_PROVIDER_SITE_OTHER): Payer: Medicare HMO | Admitting: Vascular Surgery

## 2022-05-13 ENCOUNTER — Encounter: Payer: Self-pay | Admitting: Internal Medicine

## 2022-05-13 ENCOUNTER — Observation Stay: Payer: Medicare HMO

## 2022-05-13 ENCOUNTER — Other Ambulatory Visit: Payer: Self-pay

## 2022-05-13 ENCOUNTER — Emergency Department: Payer: Medicare HMO

## 2022-05-13 ENCOUNTER — Observation Stay
Admission: EM | Admit: 2022-05-13 | Discharge: 2022-05-14 | Disposition: A | Discharge status: Home / Self Care | Payer: Medicare HMO | Attending: Internal Medicine | Admitting: Internal Medicine

## 2022-05-13 DIAGNOSIS — G9341 Metabolic encephalopathy: Secondary | ICD-10-CM | POA: Diagnosis not present

## 2022-05-13 DIAGNOSIS — N920 Excessive and frequent menstruation with regular cycle: Secondary | ICD-10-CM | POA: Diagnosis not present

## 2022-05-13 DIAGNOSIS — Z79899 Other long term (current) drug therapy: Secondary | ICD-10-CM | POA: Diagnosis not present

## 2022-05-13 DIAGNOSIS — N83202 Unspecified ovarian cyst, left side: Secondary | ICD-10-CM | POA: Insufficient documentation

## 2022-05-13 DIAGNOSIS — J3489 Other specified disorders of nose and nasal sinuses: Secondary | ICD-10-CM | POA: Diagnosis not present

## 2022-05-13 DIAGNOSIS — F419 Anxiety disorder, unspecified: Secondary | ICD-10-CM | POA: Diagnosis not present

## 2022-05-13 DIAGNOSIS — R4182 Altered mental status, unspecified: Secondary | ICD-10-CM | POA: Insufficient documentation

## 2022-05-13 DIAGNOSIS — Z7901 Long term (current) use of anticoagulants: Secondary | ICD-10-CM | POA: Insufficient documentation

## 2022-05-13 DIAGNOSIS — D649 Anemia, unspecified: Secondary | ICD-10-CM

## 2022-05-13 DIAGNOSIS — Z7902 Long term (current) use of antithrombotics/antiplatelets: Secondary | ICD-10-CM | POA: Diagnosis not present

## 2022-05-13 DIAGNOSIS — F331 Major depressive disorder, recurrent, moderate: Secondary | ICD-10-CM | POA: Insufficient documentation

## 2022-05-13 DIAGNOSIS — F17200 Nicotine dependence, unspecified, uncomplicated: Secondary | ICD-10-CM | POA: Diagnosis present

## 2022-05-13 DIAGNOSIS — Z7982 Long term (current) use of aspirin: Secondary | ICD-10-CM | POA: Insufficient documentation

## 2022-05-13 DIAGNOSIS — E785 Hyperlipidemia, unspecified: Secondary | ICD-10-CM

## 2022-05-13 DIAGNOSIS — D509 Iron deficiency anemia, unspecified: Secondary | ICD-10-CM

## 2022-05-13 DIAGNOSIS — I10 Essential (primary) hypertension: Secondary | ICD-10-CM | POA: Diagnosis present

## 2022-05-13 DIAGNOSIS — F17210 Nicotine dependence, cigarettes, uncomplicated: Secondary | ICD-10-CM | POA: Insufficient documentation

## 2022-05-13 DIAGNOSIS — E663 Overweight: Secondary | ICD-10-CM | POA: Diagnosis not present

## 2022-05-13 DIAGNOSIS — M50322 Other cervical disc degeneration at C5-C6 level: Secondary | ICD-10-CM | POA: Diagnosis not present

## 2022-05-13 DIAGNOSIS — F32A Depression, unspecified: Secondary | ICD-10-CM | POA: Diagnosis not present

## 2022-05-13 DIAGNOSIS — F329 Major depressive disorder, single episode, unspecified: Secondary | ICD-10-CM | POA: Insufficient documentation

## 2022-05-13 DIAGNOSIS — G9389 Other specified disorders of brain: Secondary | ICD-10-CM | POA: Diagnosis not present

## 2022-05-13 DIAGNOSIS — R41841 Cognitive communication deficit: Secondary | ICD-10-CM | POA: Insufficient documentation

## 2022-05-13 DIAGNOSIS — Z8673 Personal history of transient ischemic attack (TIA), and cerebral infarction without residual deficits: Secondary | ICD-10-CM | POA: Diagnosis not present

## 2022-05-13 DIAGNOSIS — Z9851 Tubal ligation status: Secondary | ICD-10-CM | POA: Diagnosis not present

## 2022-05-13 DIAGNOSIS — F418 Other specified anxiety disorders: Secondary | ICD-10-CM

## 2022-05-13 DIAGNOSIS — D259 Leiomyoma of uterus, unspecified: Secondary | ICD-10-CM | POA: Diagnosis not present

## 2022-05-13 DIAGNOSIS — D75838 Other thrombocytosis: Secondary | ICD-10-CM | POA: Insufficient documentation

## 2022-05-13 DIAGNOSIS — N83201 Unspecified ovarian cyst, right side: Secondary | ICD-10-CM | POA: Insufficient documentation

## 2022-05-13 DIAGNOSIS — Z6826 Body mass index (BMI) 26.0-26.9, adult: Secondary | ICD-10-CM | POA: Insufficient documentation

## 2022-05-13 DIAGNOSIS — R29818 Other symptoms and signs involving the nervous system: Secondary | ICD-10-CM | POA: Diagnosis not present

## 2022-05-13 DIAGNOSIS — I639 Cerebral infarction, unspecified: Secondary | ICD-10-CM | POA: Diagnosis present

## 2022-05-13 DIAGNOSIS — I480 Paroxysmal atrial fibrillation: Secondary | ICD-10-CM

## 2022-05-13 HISTORY — DX: Paroxysmal atrial fibrillation: I48.0

## 2022-05-13 HISTORY — DX: Hyperlipidemia, unspecified: E78.5

## 2022-05-13 HISTORY — DX: Other specified anxiety disorders: F41.8

## 2022-05-13 LAB — SALICYLATE LEVEL: Salicylate Lvl: <7 mg/dL — ABNORMAL LOW (ref 7.0–30.0)

## 2022-05-13 LAB — CBC
HCT: 24.6 % — ABNORMAL LOW (ref 36.0–46.0)
HCT: 23.2 % — ABNORMAL LOW (ref 36.0–46.0)
HCT: 25.8 % — ABNORMAL LOW (ref 36.0–46.0)
Hemoglobin: 6.3 g/dL — ABNORMAL LOW (ref 12.0–15.0)
Hemoglobin: 6.1 g/dL — ABNORMAL LOW (ref 12.0–15.0)
Hemoglobin: 7 g/dL — ABNORMAL LOW (ref 12.0–15.0)
MCH: 15.4 pg — ABNORMAL LOW (ref 26.0–34.0)
MCH: 15.7 pg — ABNORMAL LOW (ref 26.0–34.0)
MCH: 17 pg — ABNORMAL LOW (ref 26.0–34.0)
MCHC: 25.6 g/dL — ABNORMAL LOW (ref 30.0–36.0)
MCHC: 26.3 g/dL — ABNORMAL LOW (ref 30.0–36.0)
MCHC: 27.1 g/dL — ABNORMAL LOW (ref 30.0–36.0)
MCV: 60.3 fL — ABNORMAL LOW (ref 80.0–100.0)
MCV: 59.8 fL — ABNORMAL LOW (ref 80.0–100.0)
MCV: 62.6 fL — ABNORMAL LOW (ref 80.0–100.0)
Platelets: 465 10*3/uL — ABNORMAL HIGH (ref 150–400)
Platelets: 434 10*3/uL — ABNORMAL HIGH (ref 150–400)
Platelets: 482 10*3/uL — ABNORMAL HIGH (ref 150–400)
RBC: 4.08 MIL/uL (ref 3.87–5.11)
RBC: 3.88 MIL/uL (ref 3.87–5.11)
RBC: 4.12 MIL/uL (ref 3.87–5.11)
RDW: 21.8 % — ABNORMAL HIGH (ref 11.5–15.5)
RDW: 22 % — ABNORMAL HIGH (ref 11.5–15.5)
RDW: 24.6 % — ABNORMAL HIGH (ref 11.5–15.5)
WBC: 7 10*3/uL (ref 4.0–10.5)
WBC: 5.9 10*3/uL (ref 4.0–10.5)
WBC: 5.2 10*3/uL (ref 4.0–10.5)
nRBC: 0 % (ref 0.0–0.2)
nRBC: 0 % (ref 0.0–0.2)
nRBC: 0 % (ref 0.0–0.2)

## 2022-05-13 LAB — APTT: aPTT: 32 seconds (ref 24–36)

## 2022-05-13 LAB — COMPREHENSIVE METABOLIC PANEL
ALT: 12 U/L (ref 0–44)
AST: 18 U/L (ref 15–41)
Albumin: 4 g/dL (ref 3.5–5.0)
Alkaline Phosphatase: 93 U/L (ref 38–126)
Anion gap: 10 (ref 5–15)
BUN: 17 mg/dL (ref 6–20)
CO2: 23 mmol/L (ref 22–32)
Calcium: 9.9 mg/dL (ref 8.9–10.3)
Chloride: 102 mmol/L (ref 98–111)
Creatinine, Ser: 0.56 mg/dL (ref 0.44–1.00)
GFR, Estimated: >60 mL/min (ref >60)
Glucose, Bld: 100 mg/dL — ABNORMAL HIGH (ref 70–99)
Potassium: 3.6 mmol/L (ref 3.5–5.1)
Sodium: 135 mmol/L (ref 135–145)
Total Bilirubin: 0.7 mg/dL (ref 0.3–1.2)
Total Protein: 7.9 g/dL (ref 6.5–8.1)

## 2022-05-13 LAB — PREPARE RBC (CROSSMATCH)

## 2022-05-13 LAB — IRON AND TIBC
Iron: 13 ug/dL — ABNORMAL LOW (ref 28–170)
Saturation Ratios: 3 % — ABNORMAL LOW (ref 10.4–31.8)
TIBC: 522 ug/dL — ABNORMAL HIGH (ref 250–450)
UIBC: 509 ug/dL

## 2022-05-13 LAB — FERRITIN: Ferritin: 4 ng/mL — ABNORMAL LOW (ref 11–307)

## 2022-05-13 LAB — HEMOGLOBIN A1C
Hgb A1c MFr Bld: 5 % (ref 4.8–5.6)
Mean Plasma Glucose: 96.8 mg/dL

## 2022-05-13 LAB — ABO/RH: ABO/RH(D): A POS

## 2022-05-13 LAB — PROTIME-INR
INR: 1.2 (ref 0.8–1.2)
Prothrombin Time: 14.9 seconds (ref 11.4–15.2)

## 2022-05-13 LAB — HIV ANTIBODY (ROUTINE TESTING W REFLEX): HIV Screen 4th Generation wRfx: NONREACTIVE

## 2022-05-13 LAB — CBG MONITORING, ED: Glucose-Capillary: 110 mg/dL — ABNORMAL HIGH (ref 70–99)

## 2022-05-13 LAB — ETHANOL: Alcohol, Ethyl (B): <10 mg/dL (ref <10)

## 2022-05-13 LAB — ACETAMINOPHEN LEVEL: Acetaminophen (Tylenol), Serum: <10 ug/mL — ABNORMAL LOW (ref 10–30)

## 2022-05-13 MED ORDER — PANTOPRAZOLE SODIUM 40 MG IV SOLR
40.0000 mg | Freq: Two times a day (BID) | INTRAVENOUS | Status: DC
  Administered 2022-05-13 – 2022-05-14 (×3): 40 mg via INTRAVENOUS
  Filled 2022-05-13 (×3): qty 10

## 2022-05-13 MED ORDER — SODIUM CHLORIDE 0.9% IV SOLUTION: Freq: Once | INTRAVENOUS | Status: AC

## 2022-05-13 MED ORDER — ALPRAZOLAM 0.5 MG PO TABS: 0.5000 mg | ORAL_TABLET | Freq: Two times a day (BID) | ORAL | Status: DC | PRN

## 2022-05-13 MED ORDER — DIVALPROEX SODIUM 125 MG PO DR TAB: 125.0000 mg | DELAYED_RELEASE_TABLET | Freq: Two times a day (BID) | ORAL | Status: DC

## 2022-05-13 MED ORDER — NICOTINE 21 MG/24HR TD PT24
21.0000 mg | MEDICATED_PATCH | Freq: Every day | TRANSDERMAL | Status: DC
  Administered 2022-05-13 – 2022-05-14 (×2): 21 mg via TRANSDERMAL
  Filled 2022-05-13 (×2): qty 1

## 2022-05-13 MED ORDER — DIVALPROEX SODIUM 125 MG PO DR TAB
125.0000 mg | DELAYED_RELEASE_TABLET | Freq: Two times a day (BID) | ORAL | Status: DC
  Administered 2022-05-14: 125 mg via ORAL
  Filled 2022-05-13: qty 1

## 2022-05-13 MED ORDER — STROKE: EARLY STAGES OF RECOVERY BOOK: Freq: Once | Status: AC

## 2022-05-13 MED ORDER — SODIUM CHLORIDE 0.9 % IV SOLN
200.0000 mg | Freq: Once | INTRAVENOUS | Status: AC
  Administered 2022-05-13: 200 mg via INTRAVENOUS
  Filled 2022-05-13: qty 10

## 2022-05-13 MED ORDER — METOPROLOL SUCCINATE ER 25 MG PO TB24
25.0000 mg | ORAL_TABLET | Freq: Every day | ORAL | Status: DC
  Administered 2022-05-13 – 2022-05-14 (×2): 25 mg via ORAL
  Filled 2022-05-13 (×2): qty 1

## 2022-05-13 MED ORDER — SERTRALINE HCL 50 MG PO TABS
25.0000 mg | ORAL_TABLET | Freq: Every day | ORAL | Status: DC
  Administered 2022-05-13 – 2022-05-14 (×2): 25 mg via ORAL
  Filled 2022-05-13 (×2): qty 1

## 2022-05-13 MED ORDER — ACETAMINOPHEN 325 MG PO TABS
650.0000 mg | ORAL_TABLET | Freq: Four times a day (QID) | ORAL | Status: DC | PRN
  Administered 2022-05-13: 650 mg via ORAL
  Filled 2022-05-13 (×3): qty 2

## 2022-05-13 MED ORDER — SODIUM CHLORIDE 0.9 % IV SOLN: 510.0000 mg | Freq: Once | INTRAVENOUS | Status: DC

## 2022-05-13 MED ORDER — ONDANSETRON HCL 4 MG/2ML IJ SOLN: 4.0000 mg | Freq: Three times a day (TID) | INTRAMUSCULAR | Status: DC | PRN

## 2022-05-13 MED ORDER — ATORVASTATIN CALCIUM 20 MG PO TABS
40.0000 mg | ORAL_TABLET | Freq: Every day | ORAL | Status: DC
  Administered 2022-05-13 – 2022-05-14 (×2): 40 mg via ORAL
  Filled 2022-05-13 (×2): qty 2

## 2022-05-13 MED ORDER — HYDRALAZINE HCL 20 MG/ML IJ SOLN: 5.0000 mg | INTRAMUSCULAR | Status: DC | PRN

## 2022-05-13 MED ORDER — SENNOSIDES-DOCUSATE SODIUM 8.6-50 MG PO TABS: 1.0000 | ORAL_TABLET | Freq: Every evening | ORAL | Status: DC | PRN

## 2022-05-13 MED ORDER — FERROUS SULFATE 325 (65 FE) MG PO TABS
325.0000 mg | ORAL_TABLET | Freq: Two times a day (BID) | ORAL | Status: DC
  Administered 2022-05-13 – 2022-05-14 (×2): 325 mg via ORAL
  Filled 2022-05-13 (×2): qty 1

## 2022-05-13 NOTE — ED Notes (Signed)
Pt mother consented to blood transfusion verbally with MD Jari Pigg.

## 2022-05-13 NOTE — ED Notes (Signed)
BELONGINGS: Blue jeans Blue socks Pearline Cables slippers [employer]  2 rings 1 bracelet Black bra Black jacket Cigarettes Phone [employer]

## 2022-05-13 NOTE — Consult Note (Signed)
[facility] Face-to-Face Psychiatry Consult   Reason for Consult:  suicidal ideations Referring Physician:  EDP Patient Identification: Crystal Benitez MRN:  [id number] Principal Diagnosis: Acute metabolic encephalopathy Diagnosis:  Principal Problem:   Acute metabolic encephalopathy Active Problems:   Depression with anxiety   Nicotine dependence   Iron deficiency anemia   Stroke (HCC)   PAF (paroxysmal atrial fibrillation) (HCC)   Hypertension   HLD (hyperlipidemia)   Total Time spent with patient: 45 minutes  Subjective:   Crystal Benitez is a 42 y.o. female patient admitted with AMS, questionable stroke, consult for depression.  HPI:  42 yo female with a history of strokes, IVC'd by her family as she would not come to the ED for treatment.  On assessment, she is calm and cooperative.  She reports high depression with no suicidal ideations, denies past attempts.  Her anxiety is also high which her Xanax helps, history of panic attacks.  Denies hallucinations and substance use.  She was not interested in an antidepressant, "I don't like how they make me feel".  However, she is open for meds to assist with her "outbursts" that her mother is concerned about.  Depakote low dose started to assist, denies other concerns.  She is upset that her boyfriend's brother told her she cannot stay there and has to find a place to live.  IVC'd not release as MD fears she will leave and needs medical treatment.  Past Psychiatric History: depression, anxiety  Risk to Self:  none Risk to Others:  none Prior Inpatient Therapy:  denies Prior Outpatient Therapy:  denies  Past Medical History:  Past Medical History:  Diagnosis Date   Depression with anxiety    HLD (hyperlipidemia)    Hypertension    PAF (paroxysmal atrial fibrillation) ([place])    Stroke [facility])     Past Surgical History:  Procedure Laterality Date   CHOLECYSTECTOMY     TEE WITHOUT CARDIOVERSION N/A 03/02/2020   Procedure: TRANSESOPHAGEAL  ECHOCARDIOGRAM (TEE);  Surgeon: Teodoro Spray, MD;  Location: [facility] ORS;  Service: Cardiovascular;  Laterality: N/A;   TUBAL LIGATION     Family History:  Family History  Problem Relation Age of Onset   Hypertension Mother    Hyperlipidemia Mother    Cancer Father    Pancreatic cancer Maternal Grandmother    Breast cancer Paternal Grandmother    Family Psychiatric  History: none Social History:  Social History   Substance and Sexual Activity  Alcohol Use Yes   Comment: occasional      Social History   Substance and Sexual Activity  Drug Use No    Social History   Socioeconomic History   Marital status: Single    Spouse name: Not on file   Number of children: Not on file   Years of education: Not on file   Highest education level: Not on file  Occupational History   Not on file  Tobacco Use   Smoking status: Every Day    Packs/day: 0.50    Types: Cigarettes   Smokeless tobacco: Never  Substance and Sexual Activity   Alcohol use: Yes    Comment: occasional    Drug use: No   Sexual activity: Not Currently  Other Topics Concern   Not on file  Social History Narrative   Not on file   Social Determinants of Health   Financial Resource Strain: Not on file  Food Insecurity: Not on file  Transportation Needs: Not on file  Physical Activity: Not  on file  Stress: Not on file  Social Connections: Not on file   Additional Social History:    Allergies:  No Known Allergies  Labs:  Results for orders placed or performed during the hospital encounter of 05/13/22 (from the past 48 hour(s))  CBG monitoring, ED     Status: Abnormal   Collection Time: 05/13/22 12:02 PM  Result Value Ref Range   Glucose-Capillary 110 (H) 70 - 99 mg/dL    Comment: Glucose reference range applies only to samples taken after fasting for at least 8 hours.  Comprehensive metabolic panel     Status: Abnormal   Collection Time: 05/13/22 12:10 PM  Result Value Ref Range   Sodium 135 135 -  145 mmol/L   Potassium 3.6 3.5 - 5.1 mmol/L   Chloride 102 98 - 111 mmol/L   CO2 23 22 - 32 mmol/L   Glucose, Bld 100 (H) 70 - 99 mg/dL    Comment: Glucose reference range applies only to samples taken after fasting for at least 8 hours.   BUN 17 6 - 20 mg/dL   Creatinine, Ser 0.56 0.44 - 1.00 mg/dL   Calcium 9.9 8.9 - 10.3 mg/dL   Total Protein 7.9 6.5 - 8.1 g/dL   Albumin 4.0 3.5 - 5.0 g/dL   AST 18 15 - 41 U/L   ALT 12 0 - 44 U/L   Alkaline Phosphatase 93 38 - 126 U/L   Total Bilirubin 0.7 0.3 - 1.2 mg/dL   GFR, Estimated >60 >60 mL/min    Comment: (NOTE) Calculated using the CKD-EPI Creatinine Equation (2021)    Anion gap 10 5 - 15    Comment: Performed at [facility], [address] [place], [place] [address]  Ethanol     Status: None   Collection Time: 05/13/22 12:10 PM  Result Value Ref Range   Alcohol, Ethyl (B) <10 <10 mg/dL    Comment: (NOTE) Lowest detectable limit for serum alcohol is 10 mg/dL.  For medical purposes only. Performed at [facility], [address] [place], [place] [id number]   Salicylate level     Status: Abnormal   Collection Time: 05/13/22 12:10 PM  Result Value Ref Range   Salicylate Lvl [id number] (L) 7.0 - 30.0 mg/dL    Comment: Performed at [facility], [address] [place], [place] [address]  Acetaminophen level     Status: Abnormal   Collection Time: 05/13/22 12:10 PM  Result Value Ref Range   Acetaminophen (Tylenol), Serum <10 (L) 10 - 30 ug/mL    Comment: (NOTE) Therapeutic concentrations vary significantly. A range of 10-30 ug/mL  may be an effective concentration for many patients. However, some  are best treated at concentrations outside of this range. Acetaminophen concentrations >150 ug/mL at 4 hours after ingestion  and >50 ug/mL at 12 hours after ingestion are often associated with  toxic reactions.  Performed at [facility], [place],  [address]   cbc      Status: Abnormal   Collection Time: 05/13/22 12:10 PM  Result Value Ref Range   WBC 7.0 4.0 - 10.5 K/uL   RBC 4.08 3.87 - 5.11 MIL/uL   Hemoglobin 6.3 (L) 12.0 - 15.0 g/dL    Comment: Reticulocyte Hemoglobin testing may be clinically indicated, consider ordering this additional test [id number]    HCT 24.6 (L) 36.0 - 46.0 %   MCV 60.3 (L) 80.0 - 100.0 fL   MCH 15.4 (L)  26.0 - 34.0 pg   MCHC 25.6 (L) 30.0 - 36.0 g/dL   RDW 21.8 (H) 11.5 - 15.5 %   Platelets 465 (H) 150 - 400 K/uL   nRBC 0.0 0.0 - 0.2 %    Comment: Performed at [facility], [address] [place], [place] [address]  Ferritin (Iron Binding Protein)     Status: Abnormal   Collection Time: 05/13/22 12:10 PM  Result Value Ref Range   Ferritin 4 (L) 11 - 307 ng/mL    Comment: Performed at [facility], [address] [place], [place] [address]  Iron and TIBC     Status: Abnormal   Collection Time: 05/13/22 12:10 PM  Result Value Ref Range   Iron 13 (L) 28 - 170 ug/dL   TIBC 522 (H) 250 - 450 ug/dL   Saturation Ratios 3 (L) 10.4 - 31.8 %   UIBC 509 ug/dL    Comment: Performed at [facility], [address], [place], [place] [address]  ABO/Rh     Status: None   Collection Time: 05/13/22 12:10 PM  Result Value Ref Range   ABO/RH(D)      A POS Performed at [facility], [address] [place], [place] [address]   Protime-INR     Status: None   Collection Time: 05/13/22 12:10 PM  Result Value Ref Range   Prothrombin Time 14.9 11.4 - 15.2 seconds   INR 1.2 0.8 - 1.2    Comment: (NOTE) INR goal varies based on device and disease states. Performed at [facility], [address] [place], [place] [address]   APTT     Status: None   Collection Time: 05/13/22 12:10 PM  Result Value Ref Range   aPTT 32 24 - 36 seconds    Comment: Performed at [facility], [address] [place], [place] [address]  Type and screen [place]      Status: None (Preliminary result)   Collection Time: 05/13/22  1:50 PM  Result Value Ref Range   ABO/RH(D) A POS    Antibody Screen NEG    Sample Expiration 05/16/2022,2359    Unit Number [id number]    Blood Component Type RED CELLS,LR    Unit division 00    Status of Unit ISSUED    Transfusion Status OK TO TRANSFUSE    Crossmatch Result      Compatible Performed at [facility], [address] [place], [place] [address]   Prepare RBC (crossmatch)     Status: None   Collection Time: 05/13/22  3:30 PM  Result Value Ref Range   Order Confirmation      ORDER PROCESSED BY BLOOD BANK Performed at [facility], [address] [place], [place] [address]   CBC     Status: Abnormal   Collection Time: 05/13/22  4:38 PM  Result Value Ref Range   WBC 5.9 4.0 - 10.5 K/uL   RBC 3.88 3.87 - 5.11 MIL/uL   Hemoglobin 6.1 (L) 12.0 - 15.0 g/dL    Comment: Reticulocyte Hemoglobin testing may be clinically indicated, consider ordering this additional test [id number]    HCT 23.2 (L) 36.0 - 46.0 %   MCV 59.8 (L) 80.0 - 100.0 fL   MCH 15.7 (L) 26.0 - 34.0 pg   MCHC 26.3 (L) 30.0 - 36.0 g/dL   RDW 22.0 (H) 11.5 - 15.5 %   Platelets 434 (H) 150 - 400 K/uL  nRBC 0.0 0.0 - 0.2 %    Comment: Performed at [facility], [address] [place], [place] [address]    Current Facility-Administered Medications  Medication Dose Route Frequency Provider Last Rate Last Admin   [START ON 05/14/2022]  stroke: early stages of recovery book   Does not apply Once Ivor Costa, MD       0.9 %  sodium chloride infusion (Manually program via Guardrails IV Fluids)   Intravenous Once Vanessa Castle Shannon, MD       ferrous sulfate tablet 325 mg  325 mg Oral BID WC Ivor Costa, MD       nicotine (NICODERM CQ - dosed in mg/24 hours) patch 21 mg  21 mg Transdermal Daily Ivor Costa, MD   21 mg at 05/13/22 1555   pantoprazole (PROTONIX) injection 40 mg  40 mg Intravenous Q12H Ivor Costa, MD   40 mg  at 05/13/22 1555   senna-docusate (Senokot-S) tablet 1 tablet  1 tablet Oral QHS PRN Ivor Costa, MD        Musculoskeletal: Strength & Muscle Tone: decreased Gait & Station:  did not witness Patient leans: N/A  Psychiatric Specialty Exam: Physical Exam Vitals and nursing note reviewed.  Constitutional:      Appearance: Normal appearance.  HENT:     Head: Normocephalic.     Nose: Nose normal.  Pulmonary:     Effort: Pulmonary effort is normal.  Musculoskeletal:     Cervical back: Normal range of motion.  Neurological:     General: No focal deficit present.     Mental Status: She is alert and oriented to person, place, and time.  Psychiatric:        Attention and Perception: Attention and perception normal.        Mood and Affect: Mood is anxious and depressed.        Speech: Speech normal.        Behavior: Behavior normal. Behavior is cooperative.        Thought Content: Thought content normal.        Cognition and Memory: Cognition and memory normal.        Judgment: Judgment is inappropriate.     Review of Systems  Psychiatric/Behavioral:  Positive for depression. The patient is nervous/anxious.   All other systems reviewed and are negative.   Blood pressure 125/72, pulse 81, temperature 98.2 F (36.8 C), temperature source Oral, resp. rate 18, height '5\' 6"'$  (1.676 m), weight 74.7 kg, SpO2 97 %.Body mass index is 26.58 kg/m.  General Appearance: Disheveled  Eye Contact:  Fair  Speech:  Normal Rate  Volume:  Normal  Mood:  Anxious and Depressed  Affect:  Congruent  Thought Process:  Coherent and Descriptions of Associations: Intact  Orientation:  Full (Time, Place, and Person)  Thought Content:  WDL and Logical  Suicidal Thoughts:  No  Homicidal Thoughts:  No  Memory:  Immediate;   Fair Recent;   Fair Remote;   Fair  Judgement:  Fair  Insight:  Fair  Psychomotor Activity:  Decreased  Concentration:  Concentration: Good and Attention Span: Fair  Recall:  [employer] of Knowledge:  Fair  Language:  Good  Akathisia:  No  Handed:  Right  AIMS (if indicated):     Assets:  Leisure Time Resilience Social Support  ADL's:  Intact  Cognition:  WNL  Sleep:        Physical Exam: Physical Exam Vitals and nursing note reviewed.  Constitutional:      Appearance: Normal appearance.  HENT:     Head: Normocephalic.     Nose: Nose normal.  Pulmonary:     Effort: Pulmonary effort is normal.  Musculoskeletal:     Cervical back: Normal range of motion.  Neurological:     General: No focal deficit present.     Mental Status: She is alert and oriented to person, place, and time.  Psychiatric:        Attention and Perception: Attention and perception normal.        Mood and Affect: Mood is anxious and depressed.        Speech: Speech normal.        Behavior: Behavior normal. Behavior is cooperative.        Thought Content: Thought content normal.        Cognition and Memory: Cognition and memory normal.        Judgment: Judgment is inappropriate.    Review of Systems  Psychiatric/Behavioral:  Positive for depression. The patient is nervous/anxious.   All other systems reviewed and are negative.  Blood pressure 125/72, pulse 81, temperature 98.2 F (36.8 C), temperature source Oral, resp. rate 18, height '5\' 6"'$  (1.676 m), weight 74.7 kg, SpO2 97 %. Body mass index is 26.58 kg/m.  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   Patient does not meet criteria for psychiatric inpatient admission. Supportive therapy provided about ongoing stressors.  Waylan Boga, NP 05/13/2022 5:15 PM

## 2022-05-13 NOTE — ED Notes (Signed)
Pt taken to CT.

## 2022-05-13 NOTE — ED Notes (Signed)
Pt slow to respond to questions, pt answered questions appropriately. Pt is sleepy at this time.

## 2022-05-13 NOTE — H&P (Addendum)
History and Physical    Crystal Benitez [id number] DOB: 02-14-1981 DOA: 05/13/2022  Referring MD/NP/PA:   PCP: Barrie Lyme, FNP   Patient coming from:  The patient is coming from home.    Chief Complaint: AMS  HPI: Crystal Benitez is a 42 y.o. female with medical history significant of stroke with stroke with residual sensitive deficit, hypertension, hyperlipidemia, depression with anxiety, atrial fibrillation, who presents with altered mental status with destructive behavior.    Per report, pt she has not slept in 3 days. Pt has been confused with destructive behavior at home.  I have tried to call her mother several times without success. Per report, family member gave her Xanax before coming in. When I saw pt in ED, she is very sleep.  She is orientated to the place and person, confused about time.  History is limited.  She answered some questions appropriately. Patient states that she has heavy menstrual period, no dark stool or rectal bleeding.  Her menstrual period was regular, but was heavy.  Last menstrual period was one month ago.  Currently no active vaginal bleeding. Denies nausea, vomiting, abdominal pain.  No chest pain, cough, shortness breath.  No symptoms of UTI. Pt has a hx of strokes with L sided decreased sensation. She states that she feel weak in her left leg today. Per report, pt was refusing EMS x2. Family got IVC paperwork so pt could be seen at the ED. Patient denies suicidal or homicidal ideations.  Data reviewed independently and ED Course: pt was found to have WBC 7.0, hemoglobin 6.3 (9.6 on 03/02/2020), anemia panel (iron 13, TIBC 522, ferritin 4, saturation ratio 3), GFR> 60.  Temperature normal, blood pressure 116/77, heart rate of 104, 87, RR 18, oxygen saturation 100% on room air.  CT of head is negative for acute interval cranial issues, but showed remote infarction.  CT of C-spine is negative for acute injury, but showed degenerative disc disease.  Patient is  placed on TeleMed bed for observation.  Consulted Gust Rung of psychiatry.   EKG: I have personally reviewed.  Sinus rhythm, QTc 435, LAE, no ischemic change.   Review of Systems:   General: no fevers, chills, no body weight gain, fatigue HEENT: no blurry vision, hearing changes or sore throat Respiratory: no dyspnea, coughing, wheezing CV: no chest pain, no palpitations GI: no nausea, vomiting, abdominal pain, diarrhea, constipation GU: no dysuria, burning on urination, increased urinary frequency, hematuria  Ext: no leg edema Neuro: no vision change or hearing loss.  Has altered mental status.  Has left arm decreased sensation.  Has left leg weakness. Skin: no rash, no skin tear. MSK: No muscle spasm, no deformity, no limitation of range of movement in spin Heme: No easy bruising.  Travel history: No recent long distant travel. Psychiatry:   Allergy: No Known Allergies  Past Medical History:  Diagnosis Date   Depression with anxiety    HLD (hyperlipidemia)    Hypertension    PAF (paroxysmal atrial fibrillation) ([place])    Stroke [facility])     Past Surgical History:  Procedure Laterality Date   CHOLECYSTECTOMY     TEE WITHOUT CARDIOVERSION N/A 03/02/2020   Procedure: TRANSESOPHAGEAL ECHOCARDIOGRAM (TEE);  Surgeon: Teodoro Spray, MD;  Location: [facility] ORS;  Service: Cardiovascular;  Laterality: N/A;   TUBAL LIGATION      Social History:  reports that she has been smoking cigarettes. She has been smoking an average of .5 packs per day. She has never  used smokeless tobacco. She reports current alcohol use. She reports that she does not use drugs.  Family History:  Family History  Problem Relation Age of Onset   Hypertension Mother    Hyperlipidemia Mother    Cancer Father    Pancreatic cancer Maternal Grandmother    Breast cancer Paternal Grandmother      Prior to Admission medications   Medication Sig Start Date End Date Taking? Authorizing Provider  albuterol  (PROVENTIL HFA;VENTOLIN HFA) 108 (90 BASE) MCG/ACT inhaler Inhale 2 puffs into the lungs every 4 (four) hours as needed for wheezing. Patient not taking: Reported on 04/27/2016 09/27/12   Orpah Greek, MD  ALPRAZolam Duanne Moron) 0.5 MG tablet Take 0.5 mg by mouth 2 (two) times daily as needed. 01/06/22   [provider]  aspirin 81 MG chewable tablet Chew 1 tablet (81 mg total) by mouth daily. 03/03/20   Swayze, Ava, DO  atorvastatin (LIPITOR) 40 MG tablet Take 40 mg by mouth daily. 01/17/22   [provider]  clopidogrel (PLAVIX) 75 MG tablet Take 1 tablet (75 mg total) by mouth daily. 03/03/20   Swayze, Ava, DO  ELIQUIS 5 MG TABS tablet Take 5 mg by mouth 2 (two) times daily.    [provider]  gabapentin (NEURONTIN) 100 MG capsule Take 100 mg by mouth 2 (two) times daily. 12/17/21   [provider]  metoprolol succinate (TOPROL-XL) 25 MG 24 hr tablet Take 25 mg by mouth daily.    [provider]  nicotine (NICODERM CQ - DOSED IN MG/24 HR) 7 mg/24hr patch Place 1 patch (7 mg total) onto the skin daily. 03/03/20   Swayze, Ava, DO  sertraline (ZOLOFT) 25 MG tablet Take 25 mg by mouth daily. 12/24/21   [provider]    Physical Exam: Vitals:   05/13/22 1500 05/13/22 1600 05/13/22 1704 05/13/22 1724  BP: 114/69 124/81 125/72 117/75  Pulse: 89 77 81 92  Resp: '17 16 18 17  '$ Temp:  98.1 F (36.7 C) 98.2 F (36.8 C) 98.7 F (37.1 C)  TempSrc:   Oral Oral  SpO2: 98% 98% 97% 100%  Weight:      Height:       General: Not in acute distress HEENT:       Eyes: PERRL, EOMI, no scleral icterus.       ENT: No discharge from the ears and nose, no pharynx injection, no tonsillar enlargement.        Neck: No JVD, no bruit, no mass felt. Heme: No neck lymph node enlargement. Cardiac: S1/S2, RRR, No murmurs, No gallops or rubs. Respiratory: No rales, wheezing, rhonchi or rubs. GI: Soft, nondistended, nontender, no organomegaly, BS present. GU:  No hematuria Ext: No pitting leg edema bilaterally. 1+DP/PT pulse bilaterally. Musculoskeletal: No joint deformities, No joint redness or warmth, no limitation of ROM in spin. Skin: No rashes.  Neuro: Lethargic, orientated to place and person, not to time, cranial nerves II-XII grossly intact, muscle strength 4/5 in left leg, 5/5 in other extremities, sensation to light touch is decreased in left arm.  Psych:  no suicidal or hemocidal ideation.  Labs on Admission: I have personally reviewed following labs and imaging studies  CBC: Recent Labs  Lab 05/13/22 1210 05/13/22 1638  WBC 7.0 5.9  HGB 6.3* 6.1*  HCT 24.6* 23.2*  MCV 60.3* 59.8*  PLT 465* [id number]*   Basic Metabolic Panel: Recent Labs  Lab 05/13/22 1210  NA 135  K 3.6  CL  102  CO2 23  GLUCOSE 100*  BUN 17  CREATININE 0.56  CALCIUM 9.9   GFR: Estimated Creatinine Clearance: 93.8 mL/min (by C-G formula based on SCr of 0.56 mg/dL). Liver Function Tests: Recent Labs  Lab 05/13/22 1210  AST 18  ALT 12  ALKPHOS 93  BILITOT 0.7  PROT 7.9  ALBUMIN 4.0   No results for input(s): "LIPASE", "AMYLASE" in the last 168 hours. No results for input(s): "AMMONIA" in the last 168 hours. Coagulation Profile: Recent Labs  Lab 05/13/22 1210  INR 1.2   Cardiac Enzymes: No results for input(s): "CKTOTAL", "CKMB", "CKMBINDEX", "TROPONINI" in the last 168 hours. BNP (last 3 results) No results for input(s): "PROBNP" in the last 8760 hours. HbA1C: Recent Labs    05/13/22 1210  HGBA1C 5.0   CBG: Recent Labs  Lab 05/13/22 1202  GLUCAP 110*   Lipid Profile: No results for input(s): "CHOL", "HDL", "LDLCALC", "TRIG", "CHOLHDL", "LDLDIRECT" in the last 72 hours. Thyroid Function Tests: No results for input(s): "TSH", "T4TOTAL", "FREET4", "T3FREE", "THYROIDAB" in the last 72 hours. Anemia Panel: Recent Labs    05/13/22 1210  FERRITIN 4*  TIBC 522*  IRON 13*   Urine analysis:    Component Value Date/Time    COLORURINE YELLOW 02/17/2014 0838   APPEARANCEUR CLEAR 02/17/2014 0838   LABSPEC 1.005 02/17/2014 0838   PHURINE 5.5 02/17/2014 0838   GLUCOSEU NEGATIVE 02/17/2014 0838   HGBUR LARGE (A) 02/17/2014 0838   BILIRUBINUR NEGATIVE 02/17/2014 0838   KETONESUR NEGATIVE 02/17/2014 0838   PROTEINUR NEGATIVE 02/17/2014 0838   UROBILINOGEN 0.2 02/17/2014 0838   NITRITE NEGATIVE 02/17/2014 0838   LEUKOCYTESUR NEGATIVE 02/17/2014 0838   Sepsis Labs: '@LABRCNTIP'$ (procalcitonin:4,lacticidven:4) )No results found for this or any previous visit (from the past 240 hour(s)).   Radiological Exams on Admission: CT Cervical Spine Wo Contrast  Result Date: 05/13/2022 CLINICAL DATA:  Ataxia, cervical trauma EXAM: CT CERVICAL SPINE WITHOUT CONTRAST TECHNIQUE: Multidetector CT imaging of the cervical spine was performed without intravenous contrast. Multiplanar CT image reconstructions were also generated. RADIATION DOSE REDUCTION: This exam was performed according to the departmental dose-optimization program which includes automated exposure control, adjustment of the mA and/or kV according to patient size and/or use of iterative reconstruction technique. COMPARISON:  None Available. FINDINGS: Alignment: Normal. Skull base and vertebrae: No fracture or focal lesion. Soft tissues and spinal canal: Prominent bilateral cervical lymph nodes, none of which are enlarged by size criteria. These are stable from prior exam. No canal hematoma or prevertebral soft tissue thickening. Disc levels: Mild C5-C6 disc space narrowing and spurring. No spinal canal stenosis. Upper chest: Nonacute. Other: None. IMPRESSION: Mild C5-C6 degenerative disc disease. No acute fracture or subluxation. Electronically Signed   By: Keith Rake M.D.   On: 05/13/2022 15:53   CT HEAD WO CONTRAST (5MM)  Result Date: 05/13/2022 CLINICAL DATA:  Stroke, follow up EXAM: CT HEAD WITHOUT CONTRAST TECHNIQUE: Contiguous axial images were obtained from the  base of the skull through the vertex without intravenous contrast. RADIATION DOSE REDUCTION: This exam was performed according to the departmental dose-optimization program which includes automated exposure control, adjustment of the mA and/or kV according to patient size and/or use of iterative reconstruction technique. COMPARISON:  Head CT and brain MRI 02/29/2020 FINDINGS: Brain: Areas of encephalomalacia within the right parietal lobe at site of acute infarct on MRI. This is expected evolution since that time. Small remote infarcts in the left parietal lobe which were present on prior exam. There is  no evidence of acute ischemia. No hemorrhage or subdural collection. Normal brain volume for age. No hydrocephalus. No midline shift or mass effect. Vascular: No hyperdense vessel or unexpected calcification. Skull: No fracture or focal lesion. Sinuses/Orbits: Trace mucosal thickening of the paranasal sinuses. No sinus fluid levels. The mastoid air cells are clear. Unremarkable appearance of the orbits. Other: None. IMPRESSION: 1. No acute intracranial abnormality. 2. Remote infarcts in the right greater than left MCA territories. Electronically Signed   By: Keith Rake M.D.   On: 05/13/2022 15:49      Assessment/Plan Principal Problem:   Acute metabolic encephalopathy Active Problems:   Stroke [facility])   Iron deficiency anemia   PAF (paroxysmal atrial fibrillation) ([facility])   Hypertension   HLD (hyperlipidemia)   Anxiety and depression   Nicotine dependence   Assessment and Plan:  Acute metabolic encephalopathy with destructive behavior: Etiology is not clear.  CT head negative.  Patient complains of left leg weakness, will need to rule out stroke.  -Placed on TeleMed follow-up patient -Follow-up MRI for brain -Frequent neurochecks -Fall precaution -Consulted Gust Rung of psychiatry -f/u UA  Hx of Stroke [facility]): -hold Eliquis and Plavix due to worsening anemia -lipitor  Iron deficiency  anemia: Hgb dropped from  9.6 --> 6.3.  Possibly due to heavy menstrual period.  Denies dark stool or rectal bleeding. -transfuse 1 unit of blood -Continue IV Venofer 1 dose, 200 mg -Ferrous sulfate 325 mg twice daily -US-pelvis -recommend to f/u with GI as outpt to r/o any possibility of chronic GI blood loss  AF (paroxysmal atrial fibrillation) ([facility]) -Metoprolol -Hold Eliquis due to worsening anemia  Hypertension -IV hydralazine as needed -Metoprolol  HLD (hyperlipidemia) -Lipitor  Anxiety and depression -As needed Xanax -Zoloft -Depakote 125 mg twice daily per psychiatrist  Nicotine dependence -nicotine patch       DVT ppx: SCD  Code Status: Full code  Family Communication: I have called her mother several times without success.  Disposition Plan:  Anticipate discharge back to previous environment  Consults called: Bettey Costa for psychiatry  Admission status and Level of care: Telemetry Cardiac:    for obs    Dispo: The patient is from: Home              Anticipated d/c is to: Home              Anticipated d/c date is: 1 day              Patient currently is not medically stable to d/c.    Severity of Illness:  The appropriate patient status for this patient is INPATIENT. Inpatient status is judged to be reasonable and necessary in order to provide the required intensity of service to ensure the patient's safety. The patient's presenting symptoms, physical exam findings, and initial radiographic and laboratory data in the context of their chronic comorbidities is felt to place them at high risk for further clinical deterioration. Furthermore, it is not anticipated that the patient will be medically stable for discharge from the hospital within 2 midnights of admission.   * I certify that at the point of admission it is my clinical judgment that the patient will require inpatient hospital care spanning beyond 2 midnights from the point of admission due to  high intensity of service, high risk for further deterioration and high frequency of surveillance required.*       Date of Service 05/13/2022    Ivor Costa [facility]   If  7PM-7AM, please contact night-coverage [email or web address] 05/13/2022, 7:19 PM

## 2022-05-13 NOTE — Progress Notes (Signed)
Patient taken to u/s by transport on bed, alert and oriented. Safety sitter accompanied patient.
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
## 2022-05-14 DIAGNOSIS — D75838 Other thrombocytosis: Secondary | ICD-10-CM | POA: Insufficient documentation

## 2022-05-14 DIAGNOSIS — F419 Anxiety disorder, unspecified: Secondary | ICD-10-CM | POA: Diagnosis not present

## 2022-05-14 DIAGNOSIS — F329 Major depressive disorder, single episode, unspecified: Secondary | ICD-10-CM | POA: Insufficient documentation

## 2022-05-14 DIAGNOSIS — N83201 Unspecified ovarian cyst, right side: Secondary | ICD-10-CM | POA: Insufficient documentation

## 2022-05-14 DIAGNOSIS — I480 Paroxysmal atrial fibrillation: Secondary | ICD-10-CM | POA: Diagnosis not present

## 2022-05-14 DIAGNOSIS — D509 Iron deficiency anemia, unspecified: Secondary | ICD-10-CM | POA: Diagnosis not present

## 2022-05-14 DIAGNOSIS — G9341 Metabolic encephalopathy: Secondary | ICD-10-CM | POA: Diagnosis not present

## 2022-05-14 DIAGNOSIS — F32A Depression, unspecified: Secondary | ICD-10-CM | POA: Diagnosis not present

## 2022-05-14 LAB — BASIC METABOLIC PANEL
Anion gap: 9 (ref 5–15)
BUN: 10 mg/dL (ref 6–20)
CO2: 23 mmol/L (ref 22–32)
Calcium: 9.6 mg/dL (ref 8.9–10.3)
Chloride: 105 mmol/L (ref 98–111)
Creatinine, Ser: 0.56 mg/dL (ref 0.44–1.00)
GFR, Estimated: >60 mL/min (ref >60)
Glucose, Bld: 94 mg/dL (ref 70–99)
Potassium: 3.5 mmol/L (ref 3.5–5.1)
Sodium: 137 mmol/L (ref 135–145)

## 2022-05-14 LAB — CBC
HCT: 25.7 % — ABNORMAL LOW (ref 36.0–46.0)
HCT: 26.4 % — ABNORMAL LOW (ref 36.0–46.0)
Hemoglobin: 7 g/dL — ABNORMAL LOW (ref 12.0–15.0)
Hemoglobin: 7 g/dL — ABNORMAL LOW (ref 12.0–15.0)
MCH: 17 pg — ABNORMAL LOW (ref 26.0–34.0)
MCH: 16.7 pg — ABNORMAL LOW (ref 26.0–34.0)
MCHC: 27.2 g/dL — ABNORMAL LOW (ref 30.0–36.0)
MCHC: 26.5 g/dL — ABNORMAL LOW (ref 30.0–36.0)
MCV: 62.5 fL — ABNORMAL LOW (ref 80.0–100.0)
MCV: 62.9 fL — ABNORMAL LOW (ref 80.0–100.0)
Platelets: 457 10*3/uL — ABNORMAL HIGH (ref 150–400)
Platelets: 486 10*3/uL — ABNORMAL HIGH (ref 150–400)
RBC: 4.11 MIL/uL (ref 3.87–5.11)
RBC: 4.2 MIL/uL (ref 3.87–5.11)
RDW: 24.3 % — ABNORMAL HIGH (ref 11.5–15.5)
RDW: 24.5 % — ABNORMAL HIGH (ref 11.5–15.5)
WBC: 5.4 10*3/uL (ref 4.0–10.5)
WBC: 4.8 10*3/uL (ref 4.0–10.5)
nRBC: 0.4 % — ABNORMAL HIGH (ref 0.0–0.2)
nRBC: 0.6 % — ABNORMAL HIGH (ref 0.0–0.2)

## 2022-05-14 LAB — URINALYSIS, COMPLETE (UACMP) WITH MICROSCOPIC
Bacteria, UA: NONE SEEN
Bilirubin Urine: NEGATIVE
Glucose, UA: NEGATIVE mg/dL
Ketones, ur: NEGATIVE mg/dL
Nitrite: NEGATIVE
Protein, ur: NEGATIVE mg/dL
Specific Gravity, Urine: 1.012 (ref 1.005–1.030)
pH: 6 (ref 5.0–8.0)

## 2022-05-14 LAB — URINE DRUG SCREEN, QUALITATIVE (ARMC ONLY)
Amphetamines, Ur Screen: POSITIVE — AB
Barbiturates, Ur Screen: NOT DETECTED
Benzodiazepine, Ur Scrn: POSITIVE — AB
Cannabinoid 50 Ng, Ur ~~LOC~~: NOT DETECTED
Cocaine Metabolite,Ur ~~LOC~~: NOT DETECTED
MDMA (Ecstasy)Ur Screen: NOT DETECTED
Methadone Scn, Ur: NOT DETECTED
Opiate, Ur Screen: NOT DETECTED
Phencyclidine (PCP) Ur S: NOT DETECTED
Tricyclic, Ur Screen: NOT DETECTED

## 2022-05-14 LAB — LIPID PANEL
Cholesterol: 104 mg/dL (ref 0–200)
HDL: 44 mg/dL (ref >40)
LDL Cholesterol: 50 mg/dL (ref 0–99)
Total CHOL/HDL Ratio: 2.4 RATIO
Triglycerides: 51 mg/dL (ref <150)
VLDL: 10 mg/dL (ref 0–40)

## 2022-05-14 LAB — PREPARE RBC (CROSSMATCH)

## 2022-05-14 MED ORDER — DIVALPROEX SODIUM 125 MG PO DR TAB: 125.0000 mg | DELAYED_RELEASE_TABLET | Freq: Two times a day (BID) | ORAL | 0 refills | Status: DC

## 2022-05-14 MED ORDER — FERROUS SULFATE 325 (65 FE) MG PO TABS: 325.0000 mg | ORAL_TABLET | Freq: Every day | ORAL | 0 refills | Status: DC

## 2022-05-14 MED ORDER — NICOTINE 7 MG/24HR TD PT24: 7.0000 mg | MEDICATED_PATCH | Freq: Every day | TRANSDERMAL | 0 refills | Status: DC

## 2022-05-14 MED ORDER — SODIUM CHLORIDE 0.9 % IV SOLN
200.0000 mg | Freq: Once | INTRAVENOUS | Status: AC
  Administered 2022-05-14: 200 mg via INTRAVENOUS
  Filled 2022-05-14: qty 200

## 2022-05-14 MED ORDER — DIVALPROEX SODIUM 125 MG PO DR TAB: 125.0000 mg | DELAYED_RELEASE_TABLET | Freq: Two times a day (BID) | ORAL | 0 refills | Status: AC

## 2022-05-14 MED ORDER — SODIUM CHLORIDE 0.9% IV SOLUTION: Freq: Once | INTRAVENOUS | Status: AC

## 2022-05-14 NOTE — Progress Notes (Signed)
Corrected AVS with Rxs escribed to the [facility] on [address] in [place], [place] printed and given to the pt; pt discharged via wheelchair by nursing to the [facility] entrance

## 2022-05-14 NOTE — Evaluation (Signed)
Speech Language Pathology Evaluation Patient Details Name: Crystal Benitez MRN: [id number] DOB: 12/06/1980 Today's Date: 05/14/2022 Time: 1348-1400 SLP Time Calculation (min) (ACUTE ONLY): 12 min  Problem List:  Patient Active Problem List   Diagnosis Date Noted   Bilateral ovarian cysts 05/14/2022   Major depression 05/14/2022   Reactive thrombocytosis [id number]   Acute metabolic encephalopathy [id number]   Iron deficiency anemia 05/13/2022   Stroke ([place]) 05/13/2022   PAF (paroxysmal atrial fibrillation) ([place]) 05/13/2022   Hypertension 05/13/2022   HLD (hyperlipidemia) 05/13/2022   Anxiety and depression 05/13/2022   Acute CVA (cerebrovascular accident) ([place]) 02/29/2020   Nicotine dependence 02/29/2020   Past Medical History:  Past Medical History:  Diagnosis Date   Depression with anxiety    HLD (hyperlipidemia)    Hypertension    PAF (paroxysmal atrial fibrillation) ([place])    Stroke [facility])    Past Surgical History:  Past Surgical History:  Procedure Laterality Date   CHOLECYSTECTOMY     TEE WITHOUT CARDIOVERSION N/A 03/02/2020   Procedure: TRANSESOPHAGEAL ECHOCARDIOGRAM (TEE);  Surgeon: Teodoro Spray, MD;  Location: [facility] ORS;  Service: Cardiovascular;  Laterality: N/A;   TUBAL LIGATION     HPI:  Anberlin Piel is a 42 y.o. female with medical history significant of stroke with stroke with residual sensitive deficit, hypertension, hyperlipidemia, depression with anxiety, atrial fibrillation, who presents with altered mental status with destructive behavior. Per report, pt she has not slept in 3 days. Pt has been confused with destructive behavior at home. Pt has a hx of strokes with L sided decreased sensation. She states that she feel weak in her left leg today. Per report, pt was refusing EMS x2. Family got IVC paperwork so pt could be seen at the ED. MRI 05/13/2022 was negative for any acute intracranial abnormality. 2. 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   Assessment /  Plan / Recommendation Clinical Impression  Per pt report, "I had a stroke, I can't remember when but I can't remember anything since then." She reports that her mother handles all bills and provides all of pt's medicines. Pt reports that she isn't involved in many ADLs/iADLs because of her stroke. During this evaluation, pt presents with largely intact cognitive communication abilities as evidenced by attention to task, problem solving basic self-feeding tasks. At times, she can be tangential. No further services indicated.    SLP Assessment  SLP Recommendation/Assessment: Patient does not need any further Speech [place] [facility] SLP Visit Diagnosis: Cognitive communication deficit (R41.841)    Recommendations for follow up therapy are one component of a multi-disciplinary discharge planning process, led by the attending physician.  Recommendations may be updated based on patient status, additional functional criteria and insurance authorization.    Follow Up Recommendations  No SLP follow up    Assistance Recommended at Discharge  None  Functional Status Assessment Patient has not had a recent decline in their functional status  Frequency and Duration   N/A        SLP Evaluation Cognition  Overall Cognitive Status: Within Functional Limits for tasks assessed Arousal/Alertness: Awake/alert Orientation Level: Oriented to person;Oriented to place;Oriented to time;Oriented to situation       Comprehension  Auditory Comprehension Overall Auditory Comprehension: Appears within functional limits for tasks assessed    Expression Expression Primary Mode of Expression: Verbal Verbal Expression Overall Verbal Expression: Appears within functional limits for tasks assessed Written Expression Dominant Hand: Right Written Expression: Not tested   Oral / Motor  Oral Motor/Sensory Function Overall  Oral Motor/Sensory Function: Within functional limits Motor Speech Overall Motor  Speech: Appears within functional limits for tasks assessed           Brandee Markin B. Rutherford Nail, M.S., CCC-SLP, Mining engineer Certified Brain Injury [place]  [place] Office [phone] Ascom [phone] Fax [phone]

## 2022-05-14 NOTE — Progress Notes (Signed)
IVC rescinded. Paperwork received from ER secretary and placed on chart.

## 2022-05-14 NOTE — Consult Note (Signed)
[facility] Face-to-Face Psychiatry Consult   Reason for Consult:  suicidal ideations Referring Physician:  EDP Patient Identification: Crystal Benitez MRN:  [id number] Principal Diagnosis: Acute metabolic encephalopathy Diagnosis:  Principal Problem:   Acute metabolic encephalopathy Active Problems:   Anxiety and depression   Nicotine dependence   Iron deficiency anemia   Stroke (HCC)   PAF (paroxysmal atrial fibrillation) (HCC)   Hypertension   HLD (hyperlipidemia)   Bilateral ovarian cysts   Major depression   Reactive thrombocytosis   Total Time spent with patient: 45 minutes  Subjective:   Crystal Benitez is a 42 y.o. female patient admitted with AMS, questionable stroke, consult for depression.  Client smiling on assessment as she converses with her mother who is in the room.  She denies any side effects from the Depakote and requested it be sent to [facility] [place] at discharge.  Depression has improved with sleep and transfusions, mild level with no suicidal ideations.  No homicidal ideations, hallucinations, or substance abuse.  Psych cleared and IVC resent as she is discharging from the medical floor.  HPI on admission:  42 yo female with a history of strokes, IVC'd by her family as she would not come to the ED for treatment.  On assessment, she is calm and cooperative.  She reports high depression with no suicidal ideations, denies past attempts.  Her anxiety is also high which her Xanax helps, history of panic attacks.  Denies hallucinations and substance use.  She was not interested in an antidepressant, "I don't like how they make me feel".  However, she is open for meds to assist with her "outbursts" that her mother is concerned about.  Depakote low dose started to assist, denies other concerns.  She is upset that her boyfriend's brother told her she cannot stay there and has to find a place to live.  IVC'd not release as MD fears she will leave and needs medical treatment.  Past  Psychiatric History: depression, anxiety  Risk to Self:  none Risk to Others:  none Prior Inpatient Therapy:  denies Prior Outpatient Therapy:  denies  Past Medical History:  Past Medical History:  Diagnosis Date   Depression with anxiety    HLD (hyperlipidemia)    Hypertension    PAF (paroxysmal atrial fibrillation) ([place])    Stroke [facility])     Past Surgical History:  Procedure Laterality Date   CHOLECYSTECTOMY     TEE WITHOUT CARDIOVERSION N/A 03/02/2020   Procedure: TRANSESOPHAGEAL ECHOCARDIOGRAM (TEE);  Surgeon: Teodoro Spray, MD;  Location: [facility] ORS;  Service: Cardiovascular;  Laterality: N/A;   TUBAL LIGATION     Family History:  Family History  Problem Relation Age of Onset   Hypertension Mother    Hyperlipidemia Mother    Cancer Father    Pancreatic cancer Maternal Grandmother    Breast cancer Paternal Grandmother    Family Psychiatric  History: none Social History:  Social History   Substance and Sexual Activity  Alcohol Use Yes   Comment: occasional      Social History   Substance and Sexual Activity  Drug Use No    Social History   Socioeconomic History   Marital status: Single    Spouse name: Not on file   Number of children: Not on file   Years of education: Not on file   Highest education level: Not on file  Occupational History   Not on file  Tobacco Use   Smoking status: Every Day  Packs/day: 0.50    Types: Cigarettes   Smokeless tobacco: Never  Substance and Sexual Activity   Alcohol use: Yes    Comment: occasional    Drug use: No   Sexual activity: Not Currently  Other Topics Concern   Not on file  Social History Narrative   Not on file   Social Determinants of Health   Financial Resource Strain: Not on file  Food Insecurity: Unknown (05/13/2022)   Hunger Vital Sign    Worried About Running Out of Food in the Last Year: Patient refused    [place] in the Last Year: Patient refused  Transportation Needs: Unknown  (05/13/2022)   PRAPARE - Hydrologist (Medical): Patient refused    Lack of Transportation (Non-Medical): Patient refused  Physical Activity: Not on file  Stress: Not on file  Social Connections: Not on file   Additional Social History:    Allergies:  No Known Allergies  Labs:  Results for orders placed or performed during the hospital encounter of 05/13/22 (from the past 48 hour(s))  Urine Drug Screen, Qualitative     Status: Abnormal   Collection Time: 05/13/22  8:00 AM  Result Value Ref Range   Tricyclic, Ur Screen NONE DETECTED NONE DETECTED   Amphetamines, Ur Screen POSITIVE (A) NONE DETECTED   MDMA (Ecstasy)Ur Screen NONE DETECTED NONE DETECTED   Cocaine Metabolite,Ur [place] NONE DETECTED NONE DETECTED   Opiate, Ur Screen NONE DETECTED NONE DETECTED   Phencyclidine (PCP) Ur S NONE DETECTED NONE DETECTED   Cannabinoid 50 Ng, Ur  NONE DETECTED NONE DETECTED   Barbiturates, Ur Screen NONE DETECTED NONE DETECTED   Benzodiazepine, Ur Scrn POSITIVE (A) NONE DETECTED   Methadone Scn, Ur NONE DETECTED NONE DETECTED    Comment: (NOTE) Tricyclics + metabolites, urine    Cutoff 1000 ng/mL Amphetamines + metabolites, urine  Cutoff 1000 ng/mL MDMA (Ecstasy), urine              Cutoff 500 ng/mL Cocaine Metabolite, urine          Cutoff 300 ng/mL Opiate + metabolites, urine        Cutoff 300 ng/mL Phencyclidine (PCP), urine         Cutoff 25 ng/mL Cannabinoid, urine                 Cutoff 50 ng/mL Barbiturates + metabolites, urine  Cutoff 200 ng/mL Benzodiazepine, urine              Cutoff 200 ng/mL Methadone, urine                   Cutoff 300 ng/mL  The urine drug screen provides only a preliminary, unconfirmed analytical test result and should not be used for non-medical purposes. Clinical consideration and professional judgment should be applied to any positive drug screen result due to possible interfering substances. A more specific alternate chemical  method must be used in order to obtain a confirmed analytical result. Gas chromatography / mass spectrometry (GC/MS) is the preferred confirm atory method. Performed at [facility], [address] [place], [place] [address]   Urinalysis, Complete w Microscopic -Urine, Clean Catch     Status: Abnormal   Collection Time: 05/13/22  8:00 AM  Result Value Ref Range   Color, Urine YELLOW (A) YELLOW   APPearance HAZY (A) CLEAR   Specific Gravity, Urine 1.012 1.005 - 1.030   pH 6.0 5.0 - 8.0  Glucose, UA NEGATIVE NEGATIVE mg/dL   Hgb urine dipstick MODERATE (A) NEGATIVE   Bilirubin Urine NEGATIVE NEGATIVE   Ketones, ur NEGATIVE NEGATIVE mg/dL   Protein, ur NEGATIVE NEGATIVE mg/dL   Nitrite NEGATIVE NEGATIVE   Leukocytes,Ua TRACE (A) NEGATIVE   RBC / HPF 0-5 0 - 5 RBC/hpf   WBC, UA 6-10 0 - 5 WBC/hpf   Bacteria, UA NONE SEEN NONE SEEN   Squamous Epithelial / HPF 0-5 0 - 5 /HPF   Mucus PRESENT     Comment: Performed at [facility], [address] [place], [place] [address]  CBG monitoring, ED     Status: Abnormal   Collection Time: 05/13/22 12:02 PM  Result Value Ref Range   Glucose-Capillary 110 (H) 70 - 99 mg/dL    Comment: Glucose reference range applies only to samples taken after fasting for at least 8 hours.  Comprehensive metabolic panel     Status: Abnormal   Collection Time: 05/13/22 12:10 PM  Result Value Ref Range   Sodium 135 135 - 145 mmol/L   Potassium 3.6 3.5 - 5.1 mmol/L   Chloride 102 98 - 111 mmol/L   CO2 23 22 - 32 mmol/L   Glucose, Bld 100 (H) 70 - 99 mg/dL    Comment: Glucose reference range applies only to samples taken after fasting for at least 8 hours.   BUN 17 6 - 20 mg/dL   Creatinine, Ser 0.56 0.44 - 1.00 mg/dL   Calcium 9.9 8.9 - 10.3 mg/dL   Total Protein 7.9 6.5 - 8.1 g/dL   Albumin 4.0 3.5 - 5.0 g/dL   AST 18 15 - 41 U/L   ALT 12 0 - 44 U/L   Alkaline Phosphatase 93 38 - 126 U/L   Total Bilirubin 0.7 0.3 - 1.2 mg/dL    GFR, Estimated >60 >60 mL/min    Comment: (NOTE) Calculated using the CKD-EPI Creatinine Equation (2021)    Anion gap 10 5 - 15    Comment: Performed at [facility], [address] [place], [place] [address]  Ethanol     Status: None   Collection Time: 05/13/22 12:10 PM  Result Value Ref Range   Alcohol, Ethyl (B) <10 <10 mg/dL    Comment: (NOTE) Lowest detectable limit for serum alcohol is 10 mg/dL.  For medical purposes only. Performed at [facility], [address] [place], [place] [id number]   Salicylate level     Status: Abnormal   Collection Time: 05/13/22 12:10 PM  Result Value Ref Range   Salicylate Lvl [id number] (L) 7.0 - 30.0 mg/dL    Comment: Performed at [facility], [address] [place], [place] [address]  Acetaminophen level     Status: Abnormal   Collection Time: 05/13/22 12:10 PM  Result Value Ref Range   Acetaminophen (Tylenol), Serum <10 (L) 10 - 30 ug/mL    Comment: (NOTE) Therapeutic concentrations vary significantly. A range of 10-30 ug/mL  may be an effective concentration for many patients. However, some  are best treated at concentrations outside of this range. Acetaminophen concentrations >150 ug/mL at 4 hours after ingestion  and >50 ug/mL at 12 hours after ingestion are often associated with  toxic reactions.  Performed at [facility], [address] [place], [place] [address]   cbc     Status: Abnormal   Collection Time: 05/13/22 12:10 PM  Result Value Ref Range   WBC 7.0 4.0 - 10.5 K/uL  RBC 4.08 3.87 - 5.11 MIL/uL   Hemoglobin 6.3 (L) 12.0 - 15.0 g/dL    Comment: Reticulocyte Hemoglobin testing may be clinically indicated, consider ordering this additional test [id number]    HCT 24.6 (L) 36.0 - 46.0 %   MCV 60.3 (L) 80.0 - 100.0 fL   MCH 15.4 (L) 26.0 - 34.0 pg   MCHC 25.6 (L) 30.0 - 36.0 g/dL   RDW 21.8 (H) 11.5 - 15.5 %   Platelets 465 (H) 150 - 400 K/uL   nRBC 0.0 0.0 - 0.2 %    Comment:  Performed at [facility], [address] [place], [place] [address]  Ferritin (Iron Binding Protein)     Status: Abnormal   Collection Time: 05/13/22 12:10 PM  Result Value Ref Range   Ferritin 4 (L) 11 - 307 ng/mL    Comment: Performed at [facility], [address] [place], [place] [address]  Iron and TIBC     Status: Abnormal   Collection Time: 05/13/22 12:10 PM  Result Value Ref Range   Iron 13 (L) 28 - 170 ug/dL   TIBC 522 (H) 250 - 450 ug/dL   Saturation Ratios 3 (L) 10.4 - 31.8 %   UIBC 509 ug/dL    Comment: Performed at [facility], [address] [place], [place] [address]  ABO/Rh     Status: None   Collection Time: 05/13/22 12:10 PM  Result Value Ref Range   ABO/RH(D)      A POS Performed at [facility], [address] [place], [place] [address]   Protime-INR     Status: None   Collection Time: 05/13/22 12:10 PM  Result Value Ref Range   Prothrombin Time 14.9 11.4 - 15.2 seconds   INR 1.2 0.8 - 1.2    Comment: (NOTE) INR goal varies based on device and disease states. Performed at [facility], [address] [place], [place] [address]   APTT     Status: None   Collection Time: 05/13/22 12:10 PM  Result Value Ref Range   aPTT 32 24 - 36 seconds    Comment: Performed at [facility], [address] [place], [place] [address]  Hemoglobin A1c     Status: None   Collection Time: 05/13/22 12:10 PM  Result Value Ref Range   Hgb A1c MFr Bld 5.0 4.8 - 5.6 %    Comment: (NOTE) Pre diabetes:          5.7%-6.4%  Diabetes:              >6.4%  Glycemic control for   <7.0% adults with diabetes    Mean Plasma Glucose 96.8 mg/dL    Comment: Performed at [facility] [address], [place], [place] [address]  Type and screen [place]     Status: None (Preliminary result)   Collection Time: 05/13/22  1:50 PM  Result Value Ref Range   ABO/RH(D) A POS    Antibody Screen  NEG    Sample Expiration      05/16/2022,2359 Performed at [facility], [address] [place], [place] [address]    Unit Number [id number]    Blood Component Type RED CELLS,LR    Unit division 00    Status of Unit ISSUED,FINAL    Transfusion Status OK TO TRANSFUSE    Crossmatch Result Compatible    Unit Number [id number]    Blood Component Type RED CELLS,LR  Unit division 00    Status of Unit ALLOCATED    Transfusion Status OK TO TRANSFUSE    Crossmatch Result Compatible   Prepare RBC (crossmatch)     Status: None   Collection Time: 05/13/22  3:30 PM  Result Value Ref Range   Order Confirmation      ORDER PROCESSED BY BLOOD BANK Performed at [facility], [address] [place], [place] [address]   CBC     Status: Abnormal   Collection Time: 05/13/22  4:38 PM  Result Value Ref Range   WBC 5.9 4.0 - 10.5 K/uL   RBC 3.88 3.87 - 5.11 MIL/uL   Hemoglobin 6.1 (L) 12.0 - 15.0 g/dL    Comment: Reticulocyte Hemoglobin testing may be clinically indicated, consider ordering this additional test [id number]    HCT 23.2 (L) 36.0 - 46.0 %   MCV 59.8 (L) 80.0 - 100.0 fL   MCH 15.7 (L) 26.0 - 34.0 pg   MCHC 26.3 (L) 30.0 - 36.0 g/dL   RDW 22.0 (H) 11.5 - 15.5 %   Platelets 434 (H) 150 - 400 K/uL   nRBC 0.0 0.0 - 0.2 %    Comment: Performed at [facility], [address] [place], [place] [address]  HIV Antibody (routine testing w rflx)     Status: None   Collection Time: 05/13/22  4:38 PM  Result Value Ref Range   HIV Screen 4th Generation wRfx Non Reactive Non Reactive    Comment: Performed at [facility], [address], [place], [place] [address]  CBC     Status: Abnormal   Collection Time: 05/13/22  9:23 PM  Result Value Ref Range   WBC 5.2 4.0 - 10.5 K/uL   RBC 4.12 3.87 - 5.11 MIL/uL   Hemoglobin 7.0 (L) 12.0 - 15.0 g/dL    Comment: Reticulocyte Hemoglobin testing may be clinically indicated, consider ordering this  additional test [id number]    HCT 25.8 (L) 36.0 - 46.0 %   MCV 62.6 (L) 80.0 - 100.0 fL   MCH 17.0 (L) 26.0 - 34.0 pg   MCHC 27.1 (L) 30.0 - 36.0 g/dL   RDW 24.6 (H) 11.5 - 15.5 %   Platelets 482 (H) 150 - 400 K/uL   nRBC 0.0 0.0 - 0.2 %    Comment: Performed at [facility], [address] [place], [place] [address]  CBC     Status: Abnormal   Collection Time: 05/14/22  3:56 AM  Result Value Ref Range   WBC 5.4 4.0 - 10.5 K/uL   RBC 4.11 3.87 - 5.11 MIL/uL   Hemoglobin 7.0 (L) 12.0 - 15.0 g/dL    Comment: Reticulocyte Hemoglobin testing may be clinically indicated, consider ordering this additional test [id number]    HCT 25.7 (L) 36.0 - 46.0 %   MCV 62.5 (L) 80.0 - 100.0 fL   MCH 17.0 (L) 26.0 - 34.0 pg   MCHC 27.2 (L) 30.0 - 36.0 g/dL   RDW 24.3 (H) 11.5 - 15.5 %   Platelets 457 (H) 150 - 400 K/uL   nRBC 0.4 (H) 0.0 - 0.2 %    Comment: Performed at [facility], [address] [place], [place] [id number]  Basic metabolic panel     Status: None   Collection Time: 05/14/22  3:56 AM  Result Value Ref Range   Sodium 137 135 - 145 mmol/L   Potassium 3.5 3.5 - 5.1 mmol/L   Chloride 105  98 - 111 mmol/L   CO2 23 22 - 32 mmol/L   Glucose, Bld 94 70 - 99 mg/dL    Comment: Glucose reference range applies only to samples taken after fasting for at least 8 hours.   BUN 10 6 - 20 mg/dL   Creatinine, Ser 0.56 0.44 - 1.00 mg/dL   Calcium 9.6 8.9 - 10.3 mg/dL   GFR, Estimated >60 >60 mL/min    Comment: (NOTE) Calculated using the CKD-EPI Creatinine Equation (2021)    Anion gap 9 5 - 15    Comment: Performed at [facility], [address] [place], [place] [address]  Lipid panel     Status: None   Collection Time: 05/14/22  3:56 AM  Result Value Ref Range   Cholesterol 104 0 - 200 mg/dL   Triglycerides 51 <150 mg/dL   HDL 44 >40 mg/dL   Total CHOL/HDL Ratio 2.4 RATIO   VLDL 10 0 - 40 mg/dL   LDL Cholesterol 50 0 - 99 mg/dL    Comment:        Total  Cholesterol/HDL:CHD Risk Coronary Heart Disease Risk Table                     Men   Women  1/2 Average Risk   3.4   3.3  Average Risk       5.0   4.4  2 X Average Risk   9.6   7.1  3 X Average Risk  23.4   11.0        Use the calculated Patient Ratio above and the CHD Risk Table to determine the patient's CHD Risk.        ATP III CLASSIFICATION (LDL):  <100     mg/dL   Optimal  100-129  mg/dL   Near or Above                    Optimal  130-159  mg/dL   Borderline  160-189  mg/dL   High  >190     mg/dL   Very High Performed at [facility], [address] [place], [place] [address]   CBC     Status: Abnormal   Collection Time: 05/14/22  9:17 AM  Result Value Ref Range   WBC 4.8 4.0 - 10.5 K/uL   RBC 4.20 3.87 - 5.11 MIL/uL   Hemoglobin 7.0 (L) 12.0 - 15.0 g/dL    Comment: Reticulocyte Hemoglobin testing may be clinically indicated, consider ordering this additional test [id number]    HCT 26.4 (L) 36.0 - 46.0 %   MCV 62.9 (L) 80.0 - 100.0 fL   MCH 16.7 (L) 26.0 - 34.0 pg   MCHC 26.5 (L) 30.0 - 36.0 g/dL   RDW 24.5 (H) 11.5 - 15.5 %   Platelets 486 (H) 150 - 400 K/uL    Comment: REPEATED TO VERIFY   nRBC 0.6 (H) 0.0 - 0.2 %    Comment: Performed at [facility], [address], [place], [place] [address]  Prepare RBC (crossmatch)     Status: None   Collection Time: 05/14/22  9:30 AM  Result Value Ref Range   Order Confirmation      ORDER PROCESSED BY BLOOD BANK Performed at [facility], [address] [place], [place] [address]     Current Facility-Administered Medications  Medication Dose Route Frequency Provider Last Rate Last Admin   0.9 %  sodium chloride infusion (Manually  program via Guardrails IV Fluids)   Intravenous Once Sharen Hones, MD       acetaminophen (TYLENOL) tablet 650 mg  650 mg Oral Q6H PRN Ivor Costa, MD   650 mg at 05/13/22 2138   ALPRAZolam Duanne Moron) tablet 0.5 mg  0.5 mg Oral BID PRN Patrecia Pour, NP        atorvastatin (LIPITOR) tablet 40 mg  40 mg Oral Daily Ivor Costa, MD   40 mg at 05/14/22 1006   divalproex (DEPAKOTE) DR tablet 125 mg  125 mg Oral Q12H Patrecia Pour, NP   125 mg at 05/14/22 1006   ferrous sulfate tablet 325 mg  325 mg Oral BID WC Ivor Costa, MD   325 mg at 05/14/22 [id number]   hydrALAZINE (APRESOLINE) injection 5 mg  5 mg Intravenous Q2H PRN Ivor Costa, MD       metoprolol succinate (TOPROL-XL) 24 hr tablet 25 mg  25 mg Oral Daily Ivor Costa, MD   25 mg at 05/14/22 1006   nicotine (NICODERM CQ - dosed in mg/24 hours) patch 21 mg  21 mg Transdermal Daily Ivor Costa, MD   21 mg at 05/14/22 1012   ondansetron (ZOFRAN) injection 4 mg  4 mg Intravenous Q8H PRN Ivor Costa, MD       pantoprazole (PROTONIX) injection 40 mg  40 mg Intravenous Huntley Dec, MD   40 mg at 05/14/22 1006   senna-docusate (Senokot-S) tablet 1 tablet  1 tablet Oral QHS PRN Ivor Costa, MD       sertraline (ZOLOFT) tablet 25 mg  25 mg Oral Daily Ivor Costa, MD   25 mg at 05/14/22 1006    Musculoskeletal: Strength & Muscle Tone: decreased Gait & Station:  did not witness Patient leans: N/A  Psychiatric Specialty Exam: Physical Exam Vitals and nursing note reviewed.  Constitutional:      Appearance: Normal appearance.  HENT:     Head: Normocephalic.     Nose: Nose normal.  Pulmonary:     Effort: Pulmonary effort is normal.  Musculoskeletal:     Cervical back: Normal range of motion.  Neurological:     General: No focal deficit present.     Mental Status: She is alert and oriented to person, place, and time.  Psychiatric:        Attention and Perception: Attention and perception normal.        Mood and Affect: Mood is anxious and depressed.        Speech: Speech normal.        Behavior: Behavior normal. Behavior is cooperative.        Thought Content: Thought content normal.        Cognition and Memory: Cognition and memory normal.        Judgment: Judgment is inappropriate.     Review of  Systems  Psychiatric/Behavioral:  Positive for depression. The patient is nervous/anxious.   All other systems reviewed and are negative.   Blood pressure 105/63, pulse 81, temperature 98 F (36.7 C), resp. rate 18, height '5\' 6"'$  (1.676 m), weight 74.7 kg, SpO2 100 %.Body mass index is 26.58 kg/m.  General Appearance: Disheveled  Eye Contact:  Fair  Speech:  Normal Rate  Volume:  Normal  Mood:  Anxious and Depressed  Affect:  Congruent  Thought Process:  Coherent and Descriptions of Associations: Intact  Orientation:  Full (Time, Place, and Person)  Thought Content:  WDL and Logical  Suicidal Thoughts:  No  Homicidal Thoughts:  No  Memory:  Immediate;   Fair Recent;   Fair Remote;   Fair  Judgement:  Fair  Insight:  Fair  Psychomotor Activity:  Decreased  Concentration:  Concentration: Good and Attention Span: Fair  Recall:  [employer] of Knowledge:  Fair  Language:  Good  Akathisia:  No  Handed:  Right  AIMS (if indicated):     Assets:  Leisure Time Resilience Social Support  ADL's:  Intact  Cognition:  WNL  Sleep:        Physical Exam: Physical Exam Vitals and nursing note reviewed.  Constitutional:      Appearance: Normal appearance.  HENT:     Head: Normocephalic.     Nose: Nose normal.  Pulmonary:     Effort: Pulmonary effort is normal.  Musculoskeletal:     Cervical back: Normal range of motion.  Neurological:     General: No focal deficit present.     Mental Status: She is alert and oriented to person, place, and time.  Psychiatric:        Attention and Perception: Attention and perception normal.        Mood and Affect: Mood is anxious and depressed.        Speech: Speech normal.        Behavior: Behavior normal. Behavior is cooperative.        Thought Content: Thought content normal.        Cognition and Memory: Cognition and memory normal.        Judgment: Judgment is inappropriate.    Review of Systems  Psychiatric/Behavioral:  Positive for  depression. The patient is nervous/anxious.   All other systems reviewed and are negative.  Blood pressure 105/63, pulse 81, temperature 98 F (36.7 C), resp. rate 18, height '5\' 6"'$  (1.676 m), weight 74.7 kg, SpO2 100 %. Body mass index is 26.58 kg/m.  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   Patient does not meet criteria for psychiatric inpatient admission. Supportive therapy provided about ongoing stressors.  Waylan Boga, NP 05/14/2022 11:19 AM

## 2022-05-14 NOTE — Evaluation (Signed)
Occupational Therapy Evaluation Patient Details Name: Crystal Benitez MRN: [id number] DOB: 1980/09/10 Today's Date: 05/14/2022   History of Present Illness Crystal Benitez is a 42 y.o. female with medical history significant of stroke with stroke with residual sensitive deficit, hypertension, hyperlipidemia, depression with anxiety, atrial fibrillation, who presents with altered mental status with destructive behavior.   Clinical Impression   Crystal Benitez was seen for OT evaluation this date. Prior to hospital admission, pt was IND however endorses falling almost daily. Pt lives with boyfriend in home c 6 STE. Pt currently functioning at baseline independence. Completes ~200 ft functional mobility with no LOBs. No skilled acute OT needs identified, will sign off. Upon hospital discharge, recommend no OT follow up.   Recommendations for follow up therapy are one component of a multi-disciplinary discharge planning process, led by the attending physician.  Recommendations may be updated based on patient status, additional functional criteria and insurance authorization.   Follow Up Recommendations  No OT follow up     Assistance Recommended at Discharge Intermittent Supervision/Assistance  Patient can return home with the following Assist for transportation;Direct supervision/assist for financial management;Direct supervision/assist for medications management;Assistance with cooking/housework;Help with stairs or ramp for entrance    Functional Status Assessment  Patient has not had a recent decline in their functional status  Equipment Recommendations  Other (comment) (rollator)    Recommendations for Other Services       Precautions / Restrictions Precautions Precautions: None Restrictions Weight Bearing Restrictions: No      Mobility Bed Mobility Overal bed mobility: Independent                  Transfers Overall transfer level: Independent                         Balance Overall balance assessment: No apparent balance deficits (not formally assessed)                                         ADL either performed or assessed with clinical judgement   ADL Overall ADL's : Independent;At baseline                                       General ADL Comments: dons pants and completes ~200 ft fucntional mobility at baseline IND      Pertinent Vitals/Pain Pain Assessment Pain Assessment: No/denies pain     Hand Dominance     Extremity/Trunk Assessment Upper Extremity Assessment Upper Extremity Assessment: Overall WFL for tasks assessed   Lower Extremity Assessment Lower Extremity Assessment: Overall WFL for tasks assessed       Communication Communication Communication: No difficulties   Cognition Arousal/Alertness: Awake/alert Behavior During Therapy: WFL for tasks assessed/performed Overall Cognitive Status: Within Functional Limits for tasks assessed                                        Home Living Family/patient expects to be discharged to:: Private residence Living Arrangements: Spouse/significant other Available Help at Discharge: Family;Available PRN/intermittently Type of Home: House Home Access: Stairs to enter [employer] of Steps: 6  Home Equipment: None          Prior Functioning/Environment Prior Level of Function : History of Falls (last six months);Independent/Modified Independent             Mobility Comments: reports falling almost daily ADLs Comments: mother assists with medication mgmt        OT Problem List: Decreased safety awareness         OT Goals(Current goals can be found in the care plan section) Acute Rehab OT Goals Patient Stated Goal: to go home OT Goal Formulation: With patient Time For Goal Achievement: 05/28/22 Potential to Achieve Goals: Good   AM-PAC OT "6 Clicks" Daily Activity      Outcome Measure Help from another person eating meals?: None Help from another person taking care of personal grooming?: None Help from another person toileting, which includes using toliet, bedpan, or urinal?: None Help from another person bathing (including washing, rinsing, drying)?: None Help from another person to put on and taking off regular upper body clothing?: None Help from another person to put on and taking off regular lower body clothing?: None 6 Click Score: 24   End of Session    Activity Tolerance: Patient tolerated treatment well Patient left: in chair;with call bell/phone within reach;with nursing/sitter in room  OT Visit Diagnosis: History of falling (Z91.81)                Time: [id number] OT Time Calculation (min): 15 min Charges:  OT General Charges $OT Visit: 1 Visit OT Evaluation $OT Eval Low Complexity: 1 Low  Crystal Benitez, M.S. OTR/L  05/14/22, 8:57 AM  ascom [phone]

## 2022-05-14 NOTE — TOC Transition Note (Signed)
Transition of Care [facility]) - CM/SW Discharge Note   Patient Details  Name: Crystal Benitez MRN: [id number] Date of Birth: 03/29/1980  Transition of Care [facility]) CM/SW Contact:  Izola Price, RN Phone Number: 05/14/2022, 11:26 AM   Clinical Narrative:  2/24: Patient is discharging to home/self care today pending and after receiving blood and iron transfusion/infusions. Per hospitalist provider IVC was released. Discharge to home/self care. Simmie Davies RN CM     Final next level of care: Home/Self Care     Patient Goals and CMS Choice      Discharge Placement                         Discharge Plan and Services Additional resources added to the After Visit Summary for                  DME Arranged: N/A DME Agency: NA       HH Arranged: NA HH Agency: NA        Social Determinants of Health (SDOH) Interventions SDOH Screenings   Food Insecurity: Unknown (05/13/2022)  Housing: Low Risk  (05/13/2022)  Transportation Needs: Unknown (05/13/2022)  Utilities: Unknown (05/13/2022)  Tobacco Use: High Risk (05/13/2022)     Readmission Risk Interventions     No data to display

## 2022-05-14 NOTE — Hospital Course (Signed)
Crystal Benitez is a 42 y.o. female with medical history significant of stroke with stroke with residual sensitive deficit, hypertension, hyperlipidemia, depression with anxiety, atrial fibrillation, who presents with altered mental status with destructive behavior.    Patient also has heavy vaginal bleeding recently due to perimenopause.  Patient was monitored overnight, her mental status has improved.  She was also seen by psychiatry, no need for inpatient psychiatry admission. Hemoglobin was 6.1 at admission, she received 1 dose of IV iron and 1 unit of PRBC transfusion.  Hemoglobin today is 7.0, repeated still 7.0, will give another unit PRBC, another dose of IV iron. At this point, she is medically stable to be discharged.  She is advised to follow-up with OB/GYN as outpatient.  She will also follow-up with PCP and psychiatry as outpatient.

## 2022-05-14 NOTE — Progress Notes (Signed)
PT Cancellation Note  Patient Details Name: Crystal Benitez MRN: [id number] DOB: [birth year]   Cancelled Treatment:    Reason Eval/Treat Not Completed: PT screened, no needs identified, will sign off (Consult received and chart reviewed.  Per OT (upon completion of eval), patient largely baseline for functional mobility; no acute PT needs identified.  Will complete PT orders at this time; please re-consult should needs change prior to discharge.)  Alper Guilmette H. Owens Shark, PT, DPT, NCS 05/14/22, 11:04 AM [phone]

## 2022-05-14 NOTE — Discharge Summary (Addendum)
Physician Discharge Summary   Patient: Crystal Benitez MRN: [id number] DOB: 1980-04-06  Admit date:     05/13/2022  Discharge date: 05/14/22  Discharge Physician: Sharen Hones   PCP: Barrie Lyme, FNP   Recommendations at discharge:   PCP in 1 week. Follow-up with OB/GYN in 1 month. Follow-up with hematology in 1 month in case patient need IV iron.  Discharge Diagnoses: Principal Problem:   Acute metabolic encephalopathy Active Problems:   Stroke ([place])   Iron deficiency anemia   PAF (paroxysmal atrial fibrillation) ([facility])   Hypertension   HLD (hyperlipidemia)   Anxiety and depression   Nicotine dependence   Bilateral ovarian cysts   Major depression   Reactive thrombocytosis Overweight with BMI 26.58. Resolved Problems:   * No resolved hospital problems. [facility] Course: [name] is a 42 y.o. female with medical history significant of stroke with stroke with residual sensitive deficit, hypertension, hyperlipidemia, depression with anxiety, atrial fibrillation, who presents with altered mental status with destructive behavior.    Patient also has heavy vaginal bleeding recently due to perimenopause.  Patient was monitored overnight, her mental status has improved.  She was also seen by psychiatry, no need for inpatient psychiatry admission. Hemoglobin was 6.1 at admission, she received 1 dose of IV iron and 1 unit of PRBC transfusion.  Hemoglobin today is 7.0, repeated still 7.0, will give another unit PRBC, another dose of IV iron. At this point, she is medically stable to be discharged.  She is advised to follow-up with OB/GYN as outpatient.  She will also follow-up with PCP and psychiatry as outpatient.  Assessment and Plan:  Acute metabolic encephalopathy with destructive behavior:  Anxiety and depression Patient condition has improved, discussed with patient and mother, she had increased stress recently, she also had taking extra dose of medicines.  This could be  the source of altered mental status.  No evidence of infection. MRI of the brain was performed, ruled out stroke. Patient is also seen by psychiatry, no need for inpatient psychiatry admission. Her mental status has improved to baseline today, she is medically stable to be discharged.  Depakote is prescribed and sent to pharmacy.  Hx of Stroke [facility]): AF (paroxysmal atrial fibrillation) ([place]) Patient has irregular rectal bleeding, not currently.  Will restart her Eliquis as well as aspirin.  Discontinue Plavix at this time.   Iron deficiency anemia Irregular vaginal bleeding. Bilateral ovarian cysts. Has severe iron deficiency, received 2 doses of IV iron.  She received a second unit of PRBC for hemoglobin of 7.0 post transfusion. She will follow-up with OB/GYN in a month and hematology in 1 month just in case she needs IV iron infusion.  Transvaginal ultrasound showed bilateral ovarian cysts, recommended repeat ultrasound in the future.     Hypertension Doing home medicines.   HLD (hyperlipidemia) Resume home medicine   Nicotine dependence Advised to quit.       Consultants: Psychiatry Procedures performed: None  Disposition: Home Diet recommendation:  Discharge Diet Orders (From admission, onward)     Start     Ordered   05/14/22 0000  Diet - low sodium heart healthy        05/14/22 1032           Cardiac diet DISCHARGE MEDICATION: Allergies as of 05/14/2022   No Known Allergies      Medication List     STOP taking these medications    albuterol 108 (90 Base) MCG/ACT inhaler Commonly known as:  VENTOLIN HFA   clopidogrel 75 MG tablet Commonly known as: PLAVIX       TAKE these medications    ALPRAZolam 0.5 MG tablet Commonly known as: XANAX Take 0.5 mg by mouth 2 (two) times daily as needed.   aspirin 81 MG chewable tablet Chew 1 tablet (81 mg total) by mouth daily.   atorvastatin 40 MG tablet Commonly known as: LIPITOR Take 40 mg by mouth  daily.   Eliquis 5 MG Tabs tablet Generic drug: apixaban Take 5 mg by mouth 2 (two) times daily.   ferrous sulfate 325 (65 FE) MG tablet Take 1 tablet (325 mg total) by mouth daily with breakfast.   gabapentin 100 MG capsule Commonly known as: NEURONTIN Take 100 mg by mouth 2 (two) times daily.   metoprolol succinate 25 MG 24 hr tablet Commonly known as: TOPROL-XL Take 25 mg by mouth daily.   nicotine 7 mg/24hr patch Commonly known as: NICODERM CQ - dosed in mg/24 hr Place 1 patch (7 mg total) onto the skin daily.   sertraline 25 MG tablet Commonly known as: ZOLOFT Take 25 mg by mouth daily.        Follow-up Information     Rubie Maid, MD Follow up in 1 month(s).   Specialties: Obstetrics and Gynecology, Radiology Contact information: [place] [address] [phone]         Barrie Lyme, FNP Follow up in 1 week(s).   Specialty: Nurse Practitioner Contact information: [place] [address] [phone]         Sindy Guadeloupe, MD Follow up in 1 month(s).   Specialty: Oncology Contact information: [place] [address] [phone]                Discharge Exam: Danley Danker Weights   05/13/22 1202  Weight: 74.7 kg   General exam: Appears calm and comfortable  Respiratory system: Clear to auscultation. Respiratory effort normal. Cardiovascular system: S1 & S2 heard, RRR. No JVD, murmurs, rubs, gallops or clicks. No pedal edema. Gastrointestinal system: Abdomen is nondistended, soft and nontender. No organomegaly or masses felt. Normal bowel sounds heard. Central nervous system: Alert and oriented. No focal neurological deficits. Extremities: Symmetric 5 x 5 power. Skin: No rashes, lesions or ulcers Psychiatry: Judgement and insight appear normal. Mood & affect appropriate.    Condition at discharge: good  The results of significant diagnostics from this  hospitalization (including imaging, microbiology, ancillary and laboratory) are listed below for reference.   Imaging Studies: MR BRAIN WO CONTRAST  Result Date: 05/14/2022 CLINICAL DATA:  Acute neurologic deficit.  Altered mental status. EXAM: MRI HEAD WITHOUT CONTRAST TECHNIQUE: Multiplanar, multiecho pulse sequences of the brain and surrounding structures were obtained without intravenous contrast. COMPARISON:  02/29/2020 FINDINGS: Brain: No acute infarct, mass effect or extra-axial collection. No chronic microhemorrhage or siderosis. 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 There is multifocal periventricular white matter hyperintensity, most often a result of chronic microvascular ischemia. the midline structures are normal. Vascular: Normal flow voids. Skull and upper cervical spine: Normal marrow signal. Sinuses/Orbits: Negative. Other: None. IMPRESSION: 1. No acute intracranial abnormality. 2. 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 Electronically Signed   By: Ulyses Jarred M.D.   On: 05/14/2022 00:10   US PELVIC COMPLETE WITH TRANSVAGINAL  Result Date: 05/13/2022 CLINICAL DATA:  Heavy menstrual periods.  History of tubal ligation. EXAM: TRANSABDOMINAL AND TRANSVAGINAL ULTRASOUND OF PELVIS TECHNIQUE: Both transabdominal and transvaginal ultrasound examinations of the pelvis  were performed. Transabdominal technique was performed for global imaging of the pelvis including uterus, ovaries, adnexal regions, and pelvic cul-de-sac. It was necessary to proceed with endovaginal exam following the transabdominal exam to visualize the endometrium and ovaries. COMPARISON:  None Available. FINDINGS: Uterus Measurements: 9.8 x 6.4 x 7.2 cm = volume: 237 mL. Heterogeneous myometrium with multiple uterine fibroids. The largest measure 4.1 x 4.9 x 3.8 cm within the posterior uterine fundus-body junction and 1.8 x 1.9 x 1.6 cm within the anterior uterine body. Endometrium The endometrium is difficult to visualize separate  from the uterine fibroids and heterogeneous myometrium. Right ovary Measurements: 3.5 x 1.8 x 2.2 cm = volume: 7 mL. There is a hypoechoic well-circumscribed 1.2 x 1.5 x 1.1 cm avascular likely complex cyst with internal echoes within the right ovary. Left ovary Measurements: 3.8 x 2.5 x 2.6 cm = volume: 13 mL. Within the left ovary there is a moderately thick-walled, regular oval cyst with central anechoic region measuring approximately 1.4 x 1.3 x 1.3 cm. Other findings Trace free fluid within the pelvis. IMPRESSION: 1. Fibroid uterus. The endometrium is difficult to visualize separate from the uterine fibroids and heterogeneous myometrium. If further evaluation is desired, consider MRI of the pelvis with contrast. 2. Complex bilateral individual ovarian cysts. Recommend follow-up ultrasound in 6-12 weeks (preferably at a different portion of the patient's menstrual cycle) to assess for resolution. Electronically Signed   By: Yvonne Kendall M.D.   On: 05/13/2022 21:33   CT Cervical Spine Wo Contrast  Result Date: 05/13/2022 CLINICAL DATA:  Ataxia, cervical trauma EXAM: CT CERVICAL SPINE WITHOUT CONTRAST TECHNIQUE: Multidetector CT imaging of the cervical spine was performed without intravenous contrast. Multiplanar CT image reconstructions were also generated. RADIATION DOSE REDUCTION: This exam was performed according to the departmental dose-optimization program which includes automated exposure control, adjustment of the mA and/or kV according to patient size and/or use of iterative reconstruction technique. COMPARISON:  None Available. FINDINGS: Alignment: Normal. Skull base and vertebrae: No fracture or focal lesion. Soft tissues and spinal canal: Prominent bilateral cervical lymph nodes, none of which are enlarged by size criteria. These are stable from prior exam. No canal hematoma or prevertebral soft tissue thickening. Disc levels: Mild C5-C6 disc space narrowing and spurring. No spinal canal stenosis.  Upper chest: Nonacute. Other: None. IMPRESSION: Mild C5-C6 degenerative disc disease. No acute fracture or subluxation. Electronically Signed   By: Keith Rake M.D.   On: 05/13/2022 15:53   CT HEAD WO CONTRAST (5MM)  Result Date: 05/13/2022 CLINICAL DATA:  Stroke, follow up EXAM: CT HEAD WITHOUT CONTRAST TECHNIQUE: Contiguous axial images were obtained from the base of the skull through the vertex without intravenous contrast. RADIATION DOSE REDUCTION: This exam was performed according to the departmental dose-optimization program which includes automated exposure control, adjustment of the mA and/or kV according to patient size and/or use of iterative reconstruction technique. COMPARISON:  Head CT and brain MRI 02/29/2020 FINDINGS: Brain: Areas of encephalomalacia within the right parietal lobe at site of acute infarct on MRI. This is expected evolution since that time. Small remote infarcts in the left parietal lobe which were present on prior exam. There is no evidence of acute ischemia. No hemorrhage or subdural collection. Normal brain volume for age. No hydrocephalus. No midline shift or mass effect. Vascular: No hyperdense vessel or unexpected calcification. Skull: No fracture or focal lesion. Sinuses/Orbits: Trace mucosal thickening of the paranasal sinuses. No sinus fluid levels. The mastoid air cells are clear. Unremarkable  appearance of the orbits. Other: None. IMPRESSION: 1. No acute intracranial abnormality. 2. Remote infarcts in the right greater than left MCA territories. Electronically Signed   By: Keith Rake M.D.   On: 05/13/2022 15:49    Microbiology: Results for orders placed or performed during the hospital encounter of 02/29/20  Resp Panel by RT-PCR (Flu A&B, Covid) Nasopharyngeal Swab     Status: None   Collection Time: 02/29/20  4:02 PM   Specimen: Nasopharyngeal Swab; Nasopharyngeal(NP) swabs in vial transport medium  Result Value Ref Range Status   SARS Coronavirus 2  by RT PCR NEGATIVE NEGATIVE Final    Comment: (NOTE) SARS-CoV-2 target nucleic acids are NOT DETECTED.  The SARS-CoV-2 RNA is generally detectable in upper respiratory specimens during the acute phase of infection. The lowest concentration of SARS-CoV-2 viral copies this assay can detect is 138 copies/mL. A negative result does not preclude SARS-Cov-2 infection and should not be used as the sole basis for treatment or other patient management decisions. A negative result may occur with  improper specimen collection/handling, submission of specimen other than nasopharyngeal swab, presence of viral mutation(s) within the areas targeted by this assay, and inadequate number of viral copies(<138 copies/mL). A negative result must be combined with clinical observations, patient history, and epidemiological information. The expected result is Negative.  Fact Sheet for Patients:  EntrepreneurPulse.com.au  Fact Sheet for Healthcare Providers:  IncredibleEmployment.be  This test is no t yet approved or cleared by the [place] FDA and  has been authorized for detection and/or diagnosis of SARS-CoV-2 by FDA under an Emergency Use Authorization (EUA). This EUA will remain  in effect (meaning this test can be used) for the duration of the COVID-19 declaration under Section 564(b)(1) of the Act, 21 U.S.C.section 360bbb-3(b)(1), unless the authorization is terminated  or revoked sooner.       Influenza A by PCR NEGATIVE NEGATIVE Final   Influenza B by PCR NEGATIVE NEGATIVE Final    Comment: (NOTE) The Xpert Xpress SARS-CoV-2/FLU/RSV plus assay is intended as an aid in the diagnosis of influenza from Nasopharyngeal swab specimens and should not be used as a sole basis for treatment. Nasal washings and aspirates are unacceptable for Xpert Xpress SARS-CoV-2/FLU/RSV testing.  Fact Sheet for Patients: EntrepreneurPulse.com.au  Fact  Sheet for Healthcare Providers: IncredibleEmployment.be  This test is not yet approved or cleared by the [place] FDA and has been authorized for detection and/or diagnosis of SARS-CoV-2 by FDA under an Emergency Use Authorization (EUA). This EUA will remain in effect (meaning this test can be used) for the duration of the COVID-19 declaration under Section 564(b)(1) of the Act, 21 U.S.C. section 360bbb-3(b)(1), unless the authorization is terminated or revoked.  Performed at [facility], [address] [place], [place] [address]     Labs: CBC: Recent Labs  Lab 05/13/22 1210 05/13/22 1638 05/13/22 2123 05/14/22 0356 05/14/22 0917  WBC 7.0 5.9 5.2 5.4 4.8  HGB 6.3* 6.1* 7.0* 7.0* 7.0*  HCT 24.6* 23.2* 25.8* 25.7* 26.4*  MCV 60.3* 59.8* 62.6* 62.5* 62.9*  PLT 465* 434* 482* 457* [id number]*   Basic Metabolic Panel: Recent Labs  Lab 05/13/22 1210 05/14/22 0356  NA 135 137  K 3.6 3.5  CL 102 105  CO2 23 23  GLUCOSE 100* 94  BUN 17 10  CREATININE 0.56 0.56  CALCIUM 9.9 9.6   Liver Function Tests: Recent Labs  Lab 05/13/22 1210  AST 18  ALT 12  ALKPHOS 93  BILITOT  0.7  PROT 7.9  ALBUMIN 4.0   CBG: Recent Labs  Lab 05/13/22 1202  GLUCAP 110*    Discharge time spent: greater than 30 minutes.  Signed: Sharen Hones, MD [facility] 05/14/2022

## 2022-05-15 LAB — TYPE AND SCREEN
ABO/RH(D): A POS
Antibody Screen: NEGATIVE
Unit division: 0
Unit division: 0

## 2022-05-15 LAB — BPAM RBC
Blood Product Expiration Date: 202403152359
Blood Product Expiration Date: 202403182359
ISSUE DATE / TIME: 202402231701
ISSUE DATE / TIME: 202402241138
Unit Type and Rh: 6200
Unit Type and Rh: 6200

## 2022-05-25 DIAGNOSIS — I480 Paroxysmal atrial fibrillation: Secondary | ICD-10-CM | POA: Diagnosis not present

## 2022-05-25 DIAGNOSIS — Z720 Tobacco use: Secondary | ICD-10-CM | POA: Diagnosis not present

## 2022-05-25 DIAGNOSIS — Z8673 Personal history of transient ischemic attack (TIA), and cerebral infarction without residual deficits: Secondary | ICD-10-CM | POA: Diagnosis not present

## 2022-05-25 DIAGNOSIS — E782 Mixed hyperlipidemia: Secondary | ICD-10-CM | POA: Diagnosis not present

## 2022-05-25 DIAGNOSIS — Z9181 History of falling: Secondary | ICD-10-CM | POA: Diagnosis not present

## 2022-06-07 ENCOUNTER — Emergency Department: Payer: Medicare HMO

## 2022-06-07 ENCOUNTER — Encounter: Payer: Self-pay | Admitting: Physician Assistant

## 2022-06-07 ENCOUNTER — Emergency Department
Admission: EM | Admit: 2022-06-07 | Discharge: 2022-06-07 | Disposition: A | Discharge status: Home / Self Care | Payer: Medicare HMO | Attending: Emergency Medicine | Admitting: Emergency Medicine

## 2022-06-07 DIAGNOSIS — N888 Other specified noninflammatory disorders of cervix uteri: Secondary | ICD-10-CM | POA: Diagnosis not present

## 2022-06-07 DIAGNOSIS — R109 Unspecified abdominal pain: Secondary | ICD-10-CM | POA: Diagnosis not present

## 2022-06-07 DIAGNOSIS — Z8673 Personal history of transient ischemic attack (TIA), and cerebral infarction without residual deficits: Secondary | ICD-10-CM | POA: Insufficient documentation

## 2022-06-07 DIAGNOSIS — R102 Pelvic and perineal pain: Secondary | ICD-10-CM | POA: Diagnosis not present

## 2022-06-07 DIAGNOSIS — F1910 Other psychoactive substance abuse, uncomplicated: Secondary | ICD-10-CM | POA: Diagnosis not present

## 2022-06-07 DIAGNOSIS — Z7901 Long term (current) use of anticoagulants: Secondary | ICD-10-CM | POA: Insufficient documentation

## 2022-06-07 DIAGNOSIS — R935 Abnormal findings on diagnostic imaging of other abdominal regions, including retroperitoneum: Secondary | ICD-10-CM | POA: Diagnosis not present

## 2022-06-07 DIAGNOSIS — R4182 Altered mental status, unspecified: Secondary | ICD-10-CM | POA: Diagnosis not present

## 2022-06-07 DIAGNOSIS — R1900 Intra-abdominal and pelvic swelling, mass and lump, unspecified site: Secondary | ICD-10-CM | POA: Diagnosis not present

## 2022-06-07 DIAGNOSIS — R1030 Lower abdominal pain, unspecified: Secondary | ICD-10-CM | POA: Diagnosis not present

## 2022-06-07 DIAGNOSIS — N898 Other specified noninflammatory disorders of vagina: Secondary | ICD-10-CM | POA: Insufficient documentation

## 2022-06-07 HISTORY — DX: Other specified noninflammatory disorders of cervix uteri: N88.8

## 2022-06-07 LAB — URINE DRUG SCREEN, QUALITATIVE (ARMC ONLY)
Amphetamines, Ur Screen: POSITIVE — AB
Barbiturates, Ur Screen: NOT DETECTED
Benzodiazepine, Ur Scrn: NOT DETECTED
Cannabinoid 50 Ng, Ur ~~LOC~~: NOT DETECTED
Cocaine Metabolite,Ur ~~LOC~~: NOT DETECTED
MDMA (Ecstasy)Ur Screen: NOT DETECTED
Methadone Scn, Ur: NOT DETECTED
Opiate, Ur Screen: NOT DETECTED
Phencyclidine (PCP) Ur S: NOT DETECTED
Tricyclic, Ur Screen: NOT DETECTED

## 2022-06-07 LAB — CBC
HCT: 28.2 % — ABNORMAL LOW (ref 36.0–46.0)
Hemoglobin: 8.4 g/dL — ABNORMAL LOW (ref 12.0–15.0)
MCH: 22.1 pg — ABNORMAL LOW (ref 26.0–34.0)
MCHC: 29.8 g/dL — ABNORMAL LOW (ref 30.0–36.0)
MCV: 74.2 fL — ABNORMAL LOW (ref 80.0–100.0)
Platelets: 420 10*3/uL — ABNORMAL HIGH (ref 150–400)
RBC: 3.8 MIL/uL — ABNORMAL LOW (ref 3.87–5.11)
WBC: 9 10*3/uL (ref 4.0–10.5)
nRBC: 0 % (ref 0.0–0.2)

## 2022-06-07 LAB — URINALYSIS, ROUTINE W REFLEX MICROSCOPIC
Bacteria, UA: NONE SEEN
Bilirubin Urine: NEGATIVE
Glucose, UA: NEGATIVE mg/dL
Ketones, ur: 5 mg/dL — AB
Leukocytes,Ua: NEGATIVE
Nitrite: NEGATIVE
Protein, ur: NEGATIVE mg/dL
RBC / HPF: >50 RBC/hpf (ref 0–5)
Specific Gravity, Urine: 1.023 (ref 1.005–1.030)
pH: 6 (ref 5.0–8.0)

## 2022-06-07 LAB — COMPREHENSIVE METABOLIC PANEL
ALT: 11 U/L (ref 0–44)
AST: 15 U/L (ref 15–41)
Albumin: 3.6 g/dL (ref 3.5–5.0)
Alkaline Phosphatase: 72 U/L (ref 38–126)
Anion gap: 9 (ref 5–15)
BUN: 15 mg/dL (ref 6–20)
CO2: 23 mmol/L (ref 22–32)
Calcium: 9.9 mg/dL (ref 8.9–10.3)
Chloride: 104 mmol/L (ref 98–111)
Creatinine, Ser: 0.57 mg/dL (ref 0.44–1.00)
GFR, Estimated: >60 mL/min (ref >60)
Glucose, Bld: 97 mg/dL (ref 70–99)
Potassium: 3.8 mmol/L (ref 3.5–5.1)
Sodium: 136 mmol/L (ref 135–145)
Total Bilirubin: 0.5 mg/dL (ref 0.3–1.2)
Total Protein: 7.7 g/dL (ref 6.5–8.1)

## 2022-06-07 LAB — TYPE AND SCREEN
ABO/RH(D): A POS
Antibody Screen: NEGATIVE

## 2022-06-07 LAB — TROPONIN I (HIGH SENSITIVITY): Troponin I (High Sensitivity): 4 ng/L (ref <18)

## 2022-06-07 LAB — VALPROIC ACID LEVEL: Valproic Acid Lvl: 20 ug/mL — ABNORMAL LOW (ref 50.0–100.0)

## 2022-06-07 LAB — LIPASE, BLOOD: Lipase: 27 U/L (ref 11–51)

## 2022-06-07 LAB — POC URINE PREG, ED: Preg Test, Ur: NEGATIVE

## 2022-06-07 LAB — ETHANOL: Alcohol, Ethyl (B): <10 mg/dL (ref <10)

## 2022-06-07 MED ORDER — ACETAMINOPHEN 325 MG PO TABS
650.0000 mg | ORAL_TABLET | Freq: Once | ORAL | Status: AC
  Administered 2022-06-07: 650 mg via ORAL
  Filled 2022-06-07: qty 2

## 2022-06-07 MED ORDER — ALPRAZOLAM 0.5 MG PO TABS
0.5000 mg | ORAL_TABLET | Freq: Once | ORAL | Status: AC
  Administered 2022-06-07: 0.5 mg via ORAL
  Filled 2022-06-07: qty 1

## 2022-06-07 MED ORDER — METOPROLOL SUCCINATE ER 50 MG PO TB24
25.0000 mg | ORAL_TABLET | Freq: Once | ORAL | Status: AC
  Administered 2022-06-07: 25 mg via ORAL
  Filled 2022-06-07: qty 1

## 2022-06-07 MED ORDER — SERTRALINE HCL 50 MG PO TABS
25.0000 mg | ORAL_TABLET | Freq: Once | ORAL | Status: AC
  Administered 2022-06-07: 25 mg via ORAL
  Filled 2022-06-07: qty 1

## 2022-06-07 MED ORDER — DIVALPROEX SODIUM 125 MG PO DR TAB
125.0000 mg | DELAYED_RELEASE_TABLET | Freq: Once | ORAL | Status: AC
  Administered 2022-06-07: 125 mg via ORAL
  Filled 2022-06-07: qty 1

## 2022-06-07 MED ORDER — FERROUS SULFATE 325 (65 FE) MG PO TABS
325.0000 mg | ORAL_TABLET | Freq: Once | ORAL | Status: AC
  Administered 2022-06-07: 325 mg via ORAL
  Filled 2022-06-07: qty 1

## 2022-06-07 MED ORDER — ONDANSETRON 4 MG PO TBDP
4.0000 mg | ORAL_TABLET | Freq: Once | ORAL | Status: AC
  Administered 2022-06-07: 4 mg via ORAL
  Filled 2022-06-07: qty 1

## 2022-06-07 MED ORDER — APIXABAN 5 MG PO TABS
5.0000 mg | ORAL_TABLET | Freq: Once | ORAL | Status: AC
  Administered 2022-06-07: 5 mg via ORAL
  Filled 2022-06-07: qty 1

## 2022-06-07 MED ORDER — BUTALBITAL-APAP-CAFFEINE 50-325-40 MG PO TABS
1.0000 | ORAL_TABLET | Freq: Once | ORAL | Status: AC
  Administered 2022-06-07: 1 via ORAL
  Filled 2022-06-07: qty 1

## 2022-06-07 MED ORDER — GABAPENTIN 100 MG PO CAPS
200.0000 mg | ORAL_CAPSULE | Freq: Once | ORAL | Status: AC
  Administered 2022-06-07: 200 mg via ORAL
  Filled 2022-06-07: qty 2

## 2022-06-07 MED ORDER — IOHEXOL 300 MG/ML  SOLN
100.0000 mL | Freq: Once | INTRAMUSCULAR | Status: AC | PRN
  Administered 2022-06-07: 100 mL via INTRAVENOUS

## 2022-06-07 NOTE — ED Provider Notes (Signed)
----------------------------------------- 5:35 PM on 06/07/2022 -----------------------------------------  Blood pressure (!) 132/106, pulse (!) 126, temperature 98.4 F (36.9 C), temperature source Oral, resp. rate (!) 21, SpO2 98 %.  Assuming care from Letitia Neri, PA-C/NP-C.  In short, Crystal Benitez is a 42 y.o. female with a chief complaint of Abdominal Pain .  Refer to the original H&P for additional details.  The current plan of care is to await CT and ultrasound results.  Patient found to have CT evidence of a large cervical mass.  Patient is under the custody of [employer], and awaiting medical clearance for transport to jail.  ____________________________________________    ED Results / Procedures / Treatments   Labs (all labs ordered are listed, but only abnormal results are displayed) Labs Reviewed  CBC - Abnormal; Notable for the following components:      Result Value   RBC 3.80 (*)    Hemoglobin 8.4 (*)    HCT 28.2 (*)    MCV 74.2 (*)    MCH 22.1 (*)    MCHC 29.8 (*)    Platelets 420 (*)    All other components within normal limits  URINALYSIS, ROUTINE W REFLEX MICROSCOPIC - Abnormal; Notable for the following components:   Color, Urine YELLOW (*)    APPearance HAZY (*)    Hgb urine dipstick MODERATE (*)    Ketones, ur 5 (*)    All other components within normal limits  URINE DRUG SCREEN, QUALITATIVE ([facility] ONLY) - Abnormal; Notable for the following components:   Amphetamines, Ur Screen POSITIVE (*)    All other components within normal limits  LIPASE, BLOOD  COMPREHENSIVE METABOLIC PANEL  ETHANOL  VALPROIC ACID LEVEL  POC URINE PREG, ED  TYPE AND SCREEN  TROPONIN I (HIGH SENSITIVITY)     EKG    RADIOLOGY  I personally viewed and evaluated these images as part of my medical decision making, as well as reviewing the written report by the radiologist.  ED Provider Interpretation: Cervical lesion, concerning for cervical polyp versus neoplasm  CT  ABDOMEN PELVIS W CONTRAST  Result Date: 06/07/2022 CLINICAL DATA:  Altered mental status and abdominal pain EXAM: CT ABDOMEN AND PELVIS WITH CONTRAST TECHNIQUE: Multidetector CT imaging of the abdomen and pelvis was performed using the standard protocol following bolus administration of intravenous contrast. RADIATION DOSE REDUCTION: This exam was performed according to the departmental dose-optimization program which includes automated exposure control, adjustment of the mA and/or kV according to patient size and/or use of iterative reconstruction technique. CONTRAST:  187mL OMNIPAQUE IOHEXOL 300 MG/ML  SOLN COMPARISON:  Ultrasound 05/13/2022 FINDINGS: Lower chest: Lung bases are clear. Hepatobiliary: No focal hepatic lesion. Postcholecystectomy. No biliary duct dilatation. Common bile duct is normal. Pancreas: Pancreas is normal. No ductal dilatation. No pancreatic inflammation. Spleen: Normal spleen Adrenals/urinary tract: Adrenal glands and kidneys are normal. The ureters and bladder normal. Stomach/Bowel: Stomach, small bowel, appendix, and cecum are normal. The colon and rectosigmoid colon are normal. Vascular/Lymphatic: Abdominal aorta is normal caliber. No periportal or retroperitoneal adenopathy. No pelvic adenopathy. Reproductive: There is an ovoid mass at the level of the uterine cervix measuring 6.4 by 3.9 x 3.6 cm. This mass is peripherally enhancing and central low attenuation (image 75/7 and image 70/6/sagittal). Mass extends into the upper vagina several cm. Other: No free fluid. Musculoskeletal: No aggressive osseous lesion. IMPRESSION: 1. Large mass centered at the cervix and extending into upper vagina. Differential would include a prolapsed endometrial polyp versus cervical neoiplasm. Recommend GYN evaluation. 2.  No lymphadenopathy in the pelvis. 3. Postcholecystectomy. Electronically Signed   By: Suzy Bouchard M.D.   On: 06/07/2022 17:02   CT Head Wo Contrast  Result Date:  06/07/2022 CLINICAL DATA:  Mental status change, alcohol/drug use EXAM: CT HEAD WITHOUT CONTRAST TECHNIQUE: Contiguous axial images were obtained from the base of the skull through the vertex without intravenous contrast. RADIATION DOSE REDUCTION: This exam was performed according to the departmental dose-optimization program which includes automated exposure control, adjustment of the mA and/or kV according to patient size and/or use of iterative reconstruction technique. COMPARISON:  MRI head May 13, 2022. FINDINGS: Brain: Remote right parieto-occipital infarcts. No evidence of acute large vascular territory infarct, acute hemorrhage, mass lesion, or midline shift. Vascular: No hyperdense vessel identified. Skull: No acute fracture. Sinuses/Orbits: Clear sinuses.  No acute orbital findings. IMPRESSION: 1. No evidence of acute intracranial abnormality. 2. Remote right parieto-occipital infarcts. Electronically Signed   By: Margaretha Sheffield M.D.   On: 06/07/2022 16:49     PROCEDURES:  Critical Care performed: No  Procedures   MEDICATIONS ORDERED IN ED: Medications  acetaminophen (TYLENOL) tablet 650 mg (has no administration in time range)  ondansetron (ZOFRAN-ODT) disintegrating tablet 4 mg (has no administration in time range)  apixaban (ELIQUIS) tablet 5 mg (has no administration in time range)  ALPRAZolam (XANAX) tablet 0.5 mg (has no administration in time range)  gabapentin (NEURONTIN) capsule 200 mg (has no administration in time range)  butalbital-acetaminophen-caffeine (FIORICET) 50-325-40 MG per tablet 1 tablet (has no administration in time range)  iohexol (OMNIPAQUE) 300 MG/ML solution 100 mL (100 mLs Intravenous Contrast Given 06/07/22 1628)     IMPRESSION / MDM / ASSESSMENT AND PLAN / ED COURSE  I reviewed the triage vital signs and the nursing notes.                              Differential diagnosis includes, but is not limited to, ovarian cyst, ovarian torsion, acute  appendicitis, diverticulitis, urinary tract infection/pyelonephritis, endometriosis, bowel obstruction, colitis, renal colic, gastroenteritis, hernia, fibroids, endometriosis, pregnancy related pain including ectopic pregnancy, etc.  Patient's presentation is most consistent with acute presentation with potential threat to life or bodily function.  ----------------------------------------- 5:31 PM on 06/07/2022 ----------------------------------------- Spoke with Alma Friendly (CNM): He reviewed the case, images with Dr. Amalia Hailey.  They agree that patient can be evaluated in outpatient setting at her earliest convenience.  No indication for emergent intervention at this time.  Female patient with some intermittent reports of abnormal vaginal bleeding, presents to the ED with reports of acute abdominal pelvic pain.  Patient was evaluated for complaints in the ED, found to have reassuring exam and workup overall.  No signs of any critical anemia at this time.  No signs of acute electrolyte abnormality patient with a normal troponin.  Patient also without evidence of UA.  Patient CT did reveal a 3.9 cm cervical mass concerning for polyp versus neoplasm.  She is otherwise stable at this time, and appropriate for outpatient management with GYN as discussed.  Patient's diagnosis is consistent with pelvic pain secondary to cervical mass. Patient will be medically cleared for incarceration, and is released to the custody of [employer].  Patient is to follow up with GYN as directed, as needed or otherwise directed. Patient is given ED precautions to return to the ED for any worsening or new symptoms.  FINAL CLINICAL IMPRESSION(S) / ED DIAGNOSES   Final diagnoses:  Abdominal  pain, unspecified abdominal location  Pelvic pain  Cervical mass     Rx / DC Orders   ED Discharge Orders     None        Note:  This document was prepared using Dragon voice recognition software and may include unintentional dictation  errors.    Melvenia Needles, PA-C 06/07/22 1806    Delman Kitten, MD 06/07/22 2321

## 2022-06-07 NOTE — Discharge Instructions (Addendum)
Your exam, labs, and CT are reassuring at this time.  You have evidence of a growth on your cervix which is likely the cause of your abnormal vaginal bleeding.  Your hemoglobin and hematocrit are normal today without signs of critical anemia, or need for blood transfusion.  You should follow-up with Dr. Amalia Hailey for further evaluation and management of your pelvic pain and large cervical polyp.

## 2022-06-07 NOTE — ED Provider Notes (Signed)
[facility] Provider Note    Event Date/Time   First MD Initiated Contact with Patient 06/07/22 1310     (approximate)   History   Abdominal Pain   HPI  Crystal Benitez is a 42 y.o. female   presents to the ED via EMS with complaint of abdominal pain.  Patient is a very poor historian and gives a very vague history as to when her abdominal pain started.  She reports that a month ago she was told that she had ovarian cyst.  She was told to follow-up with a doctor which she has not made an appointment because she states she cannot remember the name of the doctor also an appointment with hematology..  She denies any trauma to her abdomen.  She reports urinary frequency but is very vague with further information.  Unable to answer questions related to last BM, last menses.  Patient currently is under arrest and low enforcement is present.      Physical Exam   Triage Vital Signs: ED Triage Vitals  Enc Vitals Group     BP 06/07/22 1311 (!) 132/106     Pulse Rate 06/07/22 1311 (!) 126     Resp 06/07/22 1311 (!) 21     Temp 06/07/22 1310 98.4 F (36.9 C)     Temp Source 06/07/22 1310 Oral     SpO2 06/07/22 1311 98 %     Weight --      Height --      Head Circumference --      Peak Flow --      Pain Score 06/07/22 1315 10     Pain Loc --      Pain Edu? --      Excl. in [place]? --     Most recent vital signs: Vitals:   06/07/22 1310 06/07/22 1311  BP:  (!) 132/106  Pulse:  (!) 126  Resp:  (!) 21  Temp: 98.4 F (36.9 C) 98.4 F (36.9 C)  SpO2:  98%     General: Awake, no distress.  Crying. CV:  Good peripheral perfusion.  Heart regular rate and rhythm. Resp:  Normal effort.  Lungs are clear bilaterally. Abd:  No distention.  Soft, diffuse tenderness throughout per patient.  Bowel sounds are normoactive x 4 quadrants. Other:     ED Results / Procedures / Treatments   Labs (all labs ordered are listed, but only abnormal results are  displayed) Labs Reviewed  CBC - Abnormal; Notable for the following components:      Result Value   RBC 3.80 (*)    Hemoglobin 8.4 (*)    HCT 28.2 (*)    MCV 74.2 (*)    MCH 22.1 (*)    MCHC 29.8 (*)    Platelets 420 (*)    All other components within normal limits  LIPASE, BLOOD  COMPREHENSIVE METABOLIC PANEL  ETHANOL  URINALYSIS, ROUTINE W REFLEX MICROSCOPIC  URINE DRUG SCREEN, QUALITATIVE ([facility] ONLY)  VALPROIC ACID LEVEL  POC URINE PREG, ED  TYPE AND SCREEN  TROPONIN I (HIGH SENSITIVITY)     EKG  Pending   RADIOLOGY Pending    PROCEDURES:  Critical Care performed:   Procedures   MEDICATIONS ORDERED IN ED: Medications  iohexol (OMNIPAQUE) 300 MG/ML solution 100 mL (100 mLs Intravenous Contrast Given 06/07/22 1628)     IMPRESSION / MDM / ASSESSMENT AND PLAN / ED COURSE  I reviewed the triage vital signs and the  nursing notes.   Differential diagnosis includes, but is not limited to, abdominal pain etiology unknown.  Test results still pending.  42 year old female presents to the ED with complaint of abdominal pain via EMS after being arrested.  Patient is very vague in her symptoms and does not answer questions clearly.  She was hospitalized in February at which time her hemoglobin was 7.0 on 05/14/2022 today is improved at 8.4.  Urgency test is negative and urinalysis and urine drug screen is pending.  Ethanol was less than 10, troponin 4, CMP unremarkable, lipase 27.  Dr. Nickolas Madrid was in see the patient as well and a CT head, CT abdomen pelvis with contrast and ultrasound complete with transvaginal was ordered.  ----------------------------------------- 4:30 PM on 06/07/2022 ----------------------------------------- Patient care is being turned over at this time to [facility], PA-C.      Patient's presentation is most consistent with acute illness / injury with system symptoms.  FINAL CLINICAL IMPRESSION(S) / ED DIAGNOSES   Final diagnoses:   Abdominal pain, unspecified abdominal location     Rx / DC Orders   ED Discharge Orders     None        Note:  This document was prepared using Dragon voice recognition software and may include unintentional dictation errors.   Johnn Hai, PA-C 06/07/22 1641    Vanessa Forks, MD 06/11/22 [phone]

## 2022-06-16 ENCOUNTER — Telehealth: Payer: Self-pay | Admitting: Obstetrics and Gynecology

## 2022-06-16 NOTE — Telephone Encounter (Signed)
I contacted the patient via phone. I left generic message for the patient to call back. Dr. Marcelline Mates is offering an opening to scheduled New GYN ER follow up for 4/12 at 11:15 am. The contact number on file says this is Crystal Benitez please leave a message. There is NO DPR on file for this patient.

## 2022-06-20 NOTE — Telephone Encounter (Signed)
The patient called and confirmed her scheduled appointment.

## 2022-06-22 ENCOUNTER — Emergency Department
Admission: EM | Admit: 2022-06-22 | Discharge: 2022-06-22 | Disposition: A | Discharge status: Home / Self Care | Payer: Medicare HMO | Attending: Emergency Medicine | Admitting: Emergency Medicine

## 2022-06-22 ENCOUNTER — Emergency Department: Payer: Medicare HMO

## 2022-06-22 ENCOUNTER — Other Ambulatory Visit: Payer: Self-pay

## 2022-06-22 DIAGNOSIS — I4891 Unspecified atrial fibrillation: Secondary | ICD-10-CM | POA: Diagnosis not present

## 2022-06-22 DIAGNOSIS — R109 Unspecified abdominal pain: Secondary | ICD-10-CM | POA: Diagnosis not present

## 2022-06-22 DIAGNOSIS — N814 Uterovaginal prolapse, unspecified: Secondary | ICD-10-CM | POA: Insufficient documentation

## 2022-06-22 DIAGNOSIS — R58 Hemorrhage, not elsewhere classified: Secondary | ICD-10-CM | POA: Diagnosis not present

## 2022-06-22 DIAGNOSIS — Z7901 Long term (current) use of anticoagulants: Secondary | ICD-10-CM | POA: Diagnosis not present

## 2022-06-22 DIAGNOSIS — I10 Essential (primary) hypertension: Secondary | ICD-10-CM | POA: Diagnosis not present

## 2022-06-22 DIAGNOSIS — Z743 Need for continuous supervision: Secondary | ICD-10-CM | POA: Diagnosis not present

## 2022-06-22 DIAGNOSIS — R1084 Generalized abdominal pain: Secondary | ICD-10-CM | POA: Diagnosis not present

## 2022-06-22 DIAGNOSIS — R102 Pelvic and perineal pain: Secondary | ICD-10-CM | POA: Diagnosis not present

## 2022-06-22 DIAGNOSIS — R1900 Intra-abdominal and pelvic swelling, mass and lump, unspecified site: Secondary | ICD-10-CM

## 2022-06-22 LAB — COMPREHENSIVE METABOLIC PANEL
ALT: 11 U/L (ref 0–44)
AST: 22 U/L (ref 15–41)
Albumin: 3.2 g/dL — ABNORMAL LOW (ref 3.5–5.0)
Alkaline Phosphatase: 77 U/L (ref 38–126)
Anion gap: 7 (ref 5–15)
BUN: 12 mg/dL (ref 6–20)
CO2: 26 mmol/L (ref 22–32)
Calcium: 9.9 mg/dL (ref 8.9–10.3)
Chloride: 104 mmol/L (ref 98–111)
Creatinine, Ser: 0.51 mg/dL (ref 0.44–1.00)
GFR, Estimated: >60 mL/min (ref >60)
Glucose, Bld: 111 mg/dL — ABNORMAL HIGH (ref 70–99)
Potassium: 3.7 mmol/L (ref 3.5–5.1)
Sodium: 137 mmol/L (ref 135–145)
Total Bilirubin: 0.4 mg/dL (ref 0.3–1.2)
Total Protein: 7.5 g/dL (ref 6.5–8.1)

## 2022-06-22 LAB — TYPE AND SCREEN
ABO/RH(D): A POS
Antibody Screen: NEGATIVE

## 2022-06-22 LAB — CBC WITH DIFFERENTIAL/PLATELET
Abs Immature Granulocytes: 0.04 10*3/uL (ref 0.00–0.07)
Basophils Absolute: 0 10*3/uL (ref 0.0–0.1)
Basophils Relative: 0 %
Eosinophils Absolute: 0.1 10*3/uL (ref 0.0–0.5)
Eosinophils Relative: 1 %
HCT: 30.4 % — ABNORMAL LOW (ref 36.0–46.0)
Hemoglobin: 9 g/dL — ABNORMAL LOW (ref 12.0–15.0)
Immature Granulocytes: 0 %
Lymphocytes Relative: 18 %
Lymphs Abs: 1.7 10*3/uL (ref 0.7–4.0)
MCH: 23.6 pg — ABNORMAL LOW (ref 26.0–34.0)
MCHC: 29.6 g/dL — ABNORMAL LOW (ref 30.0–36.0)
MCV: 79.8 fL — ABNORMAL LOW (ref 80.0–100.0)
Monocytes Absolute: 1 10*3/uL (ref 0.1–1.0)
Monocytes Relative: 11 %
Neutro Abs: 6.6 10*3/uL (ref 1.7–7.7)
Neutrophils Relative %: 70 %
Platelets: 557 10*3/uL — ABNORMAL HIGH (ref 150–400)
RBC: 3.81 MIL/uL — ABNORMAL LOW (ref 3.87–5.11)
Smear Review: NORMAL
WBC: 9.4 10*3/uL (ref 4.0–10.5)
nRBC: 0 % (ref 0.0–0.2)

## 2022-06-22 LAB — HCG, QUANTITATIVE, PREGNANCY: hCG, Beta Chain, Quant, S: 1 m[IU]/mL (ref <5)

## 2022-06-22 MED ORDER — DIAZEPAM 5 MG/ML IJ SOLN
5.0000 mg | Freq: Once | INTRAMUSCULAR | Status: AC
  Administered 2022-06-22: 5 mg via INTRAVENOUS
  Filled 2022-06-22: qty 2

## 2022-06-22 MED ORDER — OXYCODONE HCL 5 MG PO TABS: 5.0000 mg | ORAL_TABLET | Freq: Four times a day (QID) | ORAL | 0 refills | Status: DC | PRN

## 2022-06-22 MED ORDER — MORPHINE SULFATE (PF) 4 MG/ML IV SOLN
4.0000 mg | Freq: Once | INTRAVENOUS | Status: AC
  Administered 2022-06-22: 4 mg via INTRAVENOUS
  Filled 2022-06-22: qty 1

## 2022-06-22 MED ORDER — IOHEXOL 300 MG/ML  SOLN
100.0000 mL | Freq: Once | INTRAMUSCULAR | Status: AC | PRN
  Administered 2022-06-22: 100 mL via INTRAVENOUS

## 2022-06-22 MED ORDER — POLYETHYLENE GLYCOL 3350 17 G PO PACK: 17.0000 g | PACK | Freq: Two times a day (BID) | ORAL | 0 refills | Status: AC

## 2022-06-22 MED ORDER — ONDANSETRON HCL 4 MG/2ML IJ SOLN
4.0000 mg | Freq: Once | INTRAMUSCULAR | Status: AC
  Administered 2022-06-22: 4 mg via INTRAVENOUS
  Filled 2022-06-22: qty 2

## 2022-06-22 NOTE — ED Notes (Addendum)
Pt states coming in with a mass on her cirvex and ovaries have cyst. When doing vaginal exam with provider bloody tissue noted to be coming out of vaginal area. Pt states 10/10 pain and vaginal bleeding for over a month.  Pt on BP, cardiac and pulse ox monitor

## 2022-06-22 NOTE — ED Triage Notes (Signed)
BIBEMS, c/o vaginal bleeding x1 month. Dx with mass on cervix and cysts on both ovaries. Pt reports vaginal bleeding has gotten heavier since last week. Pt reports lower ABD pain that suddenly started this AM. Ambulatory on scene. 104 HR. C/o nausea. VSS, GCS 15

## 2022-06-22 NOTE — Discharge Instructions (Signed)
Take acetaminophen 650 mg every 6 hours for pain.  Take with food.  In cases of severe/breakthrough pain take oxycodone as prescribed.  Take MiraLAX as prescribed to help with constipation.  Follow-up with Dr. Marcelline Mates with gynecology as scheduled.  Thank you for choosing [place] for your health care today!  Please see your primary doctor this week for a follow up appointment.   It is important that you continue to monitor your symptoms and call your doctor right away or return to the emergency department if you develop any new or worsening symptoms -including severe bleeding, severe pain, or any other unexpected symptoms.  Please go to the following website to schedule new (and existing) patient appointments:   [email or web address]  If you do not have a primary doctor try calling the following clinics to establish care:  If you have insurance:  [facility] [phone] [place] [address]   [facility]  [phone] [address] [place] [address]   If you do not have insurance:  [facility]  [phone] [address] [place] [address]   The following is another list of primary care offices in the area who are accepting new patients at this time.  Please reach out to one of them directly and let them know you would like to schedule an appointment to follow up on an Emergency Department visit, and/or to establish a new primary care provider (PCP).  There are likely other primary care clinics in the are who are accepting new patients, but this is an excellent place to start:  [place] physician: Dr Lavon Paganini [address] [place], [place] [address] [phone]  [facility] Lead Physician: Dr Steele Sizer [address], [place], [place] [address] [phone]  [place] Physician: Dr Park Liter [address] [place], [place] [address] [phone]  [facility] Lead Physician: Dr Dewaine Oats [place], [place], [place] [address] [phone]  [facility] Physician: Dr Halina Maidens [address] [place], [place] [address] [phone]   It was my pleasure to care for you today.   Hoover Brunette Jacelyn Grip, MD

## 2022-06-22 NOTE — ED Notes (Signed)
Pt back from CT. Pt on cardiac, bp and pulse ox monitor.

## 2022-06-22 NOTE — ED Notes (Signed)
Pt still complaining of pain. Provider notified °

## 2022-06-22 NOTE — ED Provider Notes (Signed)
[facility] Provider Note    Event Date/Time   First MD Initiated Contact with Patient 06/22/22 0700     (approximate)   History   Abdominal Pain and Vaginal Bleeding   HPI  Crystal Benitez is a 42 y.o. female   Past medical history of paroxysmal atrial fibrillation on Eliquis, prior stroke, hypertension hyperlipidemia, depression, iron deficiency anemia, presents to the emergency department with pelvic pain diffuse abdominal pain for the last several days.  No obvious inciting event, no trauma.  She has had ongoing vaginal bleeding for the last 1 month.  She has established gynecology new patient later this month because she was diagnosed with a fibroid/cervical mass suspicious for malignancy in February.   External Medical Documents Reviewed: Pelvic ultrasound from 05/13/2022 as well as a CT scan of the abdomen pelvis from 06/07/2022 with uterine/cervical mass approximately 6 x 4 x 4 cm extending into the upper vaginal area, as well as GYN phone call note from 3/28 stating "Dr. Marcelline Mates is offering an opening to scheduled New GYN ER follow up for 4/12 at 11:15 am. "      Physical Exam   Triage Vital Signs: ED Triage Vitals [06/22/22 0704]  Enc Vitals Group     BP 103/74     Pulse Rate 91     Resp (!) 24     Temp 97.7 F (36.5 C)     Temp Source Oral     SpO2 100 %     Weight      Height      Head Circumference      Peak Flow      Pain Score      Pain Loc      Pain Edu?      Excl. in [place]?     Most recent vital signs: Vitals:   06/22/22 0730 06/22/22 0930  BP: (!) 130/93 118/73  Pulse: 94 87  Resp: 18 17  Temp:    SpO2: 100% 99%    General: Awake, appears to be in pain, crying, writhing in stretcher. CV:  Good peripheral perfusion.  Resp:  Normal effort.  Tachypnea at 24 Abd:  No distention.  Diffuse tenderness to palpation of the abdomen in all quadrants without rigidity or guarding Other:  There is a approximately 5 cm firm bulbous  mass protruding from the vaginal canal with some very scant blood surrounding no active bleeding, she is not tolerating palpation and thus did not perform attempted reduction at bedside   ED Results / Procedures / Treatments   Labs (all labs ordered are listed, but only abnormal results are displayed) Labs Reviewed  COMPREHENSIVE METABOLIC PANEL - Abnormal; Notable for the following components:      Result Value   Glucose, Bld 111 (*)    Albumin 3.2 (*)    All other components within normal limits  CBC WITH DIFFERENTIAL/PLATELET - Abnormal; Notable for the following components:   RBC 3.81 (*)    Hemoglobin 9.0 (*)    HCT 30.4 (*)    MCV 79.8 (*)    MCH 23.6 (*)    MCHC 29.6 (*)    Platelets 557 (*)    All other components within normal limits  HCG, QUANTITATIVE, PREGNANCY  TYPE AND SCREEN     I ordered and reviewed the above labs they are notable for normal electrolytes and a creatinine of 0.51  RADIOLOGY I independently reviewed and interpreted CT abdomen pelvis and see a  large central pelvic mass   PROCEDURES:  Critical Care performed: No  Procedures   MEDICATIONS ORDERED IN ED: Medications  morphine (PF) 4 MG/ML injection 4 mg (4 mg Intravenous Given 06/22/22 0722)  ondansetron (ZOFRAN) injection 4 mg (4 mg Intravenous Given 06/22/22 0722)  diazepam (VALIUM) injection 5 mg (5 mg Intravenous Given 06/22/22 0853)  morphine (PF) 4 MG/ML injection 4 mg (4 mg Intravenous Given 06/22/22 0815)  iohexol (OMNIPAQUE) 300 MG/ML solution 100 mL (100 mLs Intravenous Contrast Given 06/22/22 0843)    External physician / consultants:  I spoke with Gynecology consultant Linda Hedges of [place] and  OB GYN Philip Aspen regarding care plan for this patient.   IMPRESSION / MDM / ASSESSMENT AND PLAN / ED COURSE  I reviewed the triage vital signs and the nursing notes.                                Patient's presentation is most consistent with acute presentation with  potential threat to life or bodily function.  Differential diagnosis includes, but is not limited to, uterine prolapse, fibroid uterus, malignancy, intra-abdominal infection, perforated viscus, acute blood loss anemia   The patient is on the cardiac monitor to evaluate for evidence of arrhythmia and/or significant heart rate changes.  MDM: This is a patient with a prolapsed mass from her vagina with a known cervical/uterine mass that is awaiting outpatient clinic visit from gynecology later this month.  I could not reduce at bedside that she is not a significant amount of pain, I will consult with gynecology for further management.  I have ordered for IV morphine as well as IV Zofran.  She has some scant bleeding and history of iron deficiency anemia and is on Eliquis.  I have ordered type and screen as well as basic labs to check for the degree of anemia and if it warrants blood transfusion.    She has diffuse severe abdominal pain and tenderness with palpation.  I have ordered a CT scan of the abdomen pelvis to further assess her abdominal pain and perforation or complication from known pelvic mass.  She has been urinating without difficulty, doubt retention from this mass.   I have consulted with [facility] and they will come evaluate the patient and give recommendations for further management.  I spoke with Dr. Amalia Hailey of [facility] gynecology over the telephone and it appears this is a prolapsed fibroid that will eventually need surgical management, but not emergently.  Plan will be to follow-up the results of the CT scan read and pain control with outpatient follow-up.  CT scan without any surgical pathology, discussed with gynecology as above, pain well-controlled, plan is for discharge now with [place] follow-up April 12 and return precautions given for any new or unexpected symptoms.      FINAL CLINICAL IMPRESSION(S) / ED DIAGNOSES   Final diagnoses:  Pelvic mass   Uterine prolapse     Rx / DC Orders   ED Discharge Orders          Ordered    polyethylene glycol (MIRALAX) 17 g packet  2 times daily        06/22/22 0959    oxyCODONE (ROXICODONE) 5 MG immediate release tablet  Every 6 hours PRN        06/22/22 0959             Note:  This document was prepared using  Dragon [employer] forces training and education officer and may include unintentional dictation errors.    Lucillie Garfinkel, MD 06/22/22 1001

## 2022-06-22 NOTE — ED Notes (Signed)
PT states pain and nausea have not improved, but that she has not vomited. EDP notified.

## 2022-06-29 NOTE — Progress Notes (Deleted)
    GYNECOLOGY PROGRESS NOTE  Subjective:    Patient ID: Crystal Benitez, female    DOB: 05-12-1980, 42 y.o.   MRN: [id number]  HPI  Patient is a 42 y.o. No obstetric history on file. female who presents for ER Follow up for prolapsed mass from her vagina with a known cervical/uterine mass. She presented at the ED on 06/22/2022 with pelvic pain and abdominal pain. She has a past medical history of paroxysmal atrial fibrillation on Eliquis, prior stroke, hypertension hyperlipidemia, depression, iron deficiency anemia. She has had ongoing vaginal bleeding for the last 1 month.  {Common ambulatory SmartLinks:19316}  Review of Systems {ros; complete:30496}   Objective:   There were no vitals taken for this visit. There is no height or weight on file to calculate BMI. General appearance: {general exam:16600} Abdomen: {abdominal exam:16834} Pelvic: {pelvic exam:16852::"cervix normal in appearance","external genitalia normal","no adnexal masses or tenderness","no cervical motion tenderness","rectovaginal septum normal","uterus normal size, shape, and consistency","vagina normal without discharge"} Extremities: {extremity exam:5109} Neurologic: {neuro exam:17854}   Assessment:   No diagnosis found.   Plan:   There are no diagnoses linked to this encounter.     Hildred Laser, MD [facility]

## 2022-07-01 ENCOUNTER — Encounter: Payer: Medicare HMO | Admitting: Obstetrics and Gynecology

## 2022-07-05 NOTE — Progress Notes (Unsigned)
GYNECOLOGY PROGRESS NOTE  Subjective:    Patient ID: Crystal Benitez, female    DOB: 01-02-1981, 42 y.o.   MRN: [id number]  HPI  Patient is a 42 y.o. female who presents for ED follow up. She was evaluated in the ED on 06/22/2022 for pelvic mass. She has had ongoing vaginal bleeding for the last 1 month. Prior to this, her bleeding has been irregular and fairly constant for the past 2 years. She has been experiencing abdominal pain, and has had several ER visits over the past several months for this pain. She was diagnosed with a cervical/vaginal mass suspicious for malignancy vs polyp in February. No LMP recorded. She has not seen a GYN in several years.    Menstrual History: OB History   No obstetric history on file.       Past Medical History:  Diagnosis Date   Depression with anxiety    HLD (hyperlipidemia)    Hypertension    PAF (paroxysmal atrial fibrillation) (HCC)    Stroke [facility])     Past Surgical History:  Procedure Laterality Date   CHOLECYSTECTOMY     TEE WITHOUT CARDIOVERSION N/A 03/02/2020   Procedure: TRANSESOPHAGEAL ECHOCARDIOGRAM (TEE);  Surgeon: Dalia Heading, MD;  Location: [facility] ORS;  Service: Cardiovascular;  Laterality: N/A;   TUBAL LIGATION      Family History  Problem Relation Age of Onset   Hypertension Mother    Hyperlipidemia Mother    Cancer Father    Pancreatic cancer Maternal Grandmother    Breast cancer Paternal Grandmother      Review of Systems Pertinent items are noted in HPI.   Objective:   Blood pressure 119/88, pulse (!) 132, weight 151 lb 3.2 oz (68.6 kg). Body mass index is 24.4 kg/m. General appearance: alert and no distress Abdomen: soft, non-tender; bowel sounds normal; no masses,  no organomegaly Pelvic: external genitalia normal, rectovaginal septum normal.  Vagina with moderate blood, large smooth circumscribed mass protruding into vaginal canal, ~ 4-5 cm.  Cervix only partially visualized from 2-4 o'clock. Unable to  fully assess uterine size due to discomfort with exam.  Adnexae non-palpable, nontender bilaterally.  Extremities: extremities normal, atraumatic, no cyanosis or edema. Electronic tagging device noted on RLE.  Neurologic: Grossly normal   Labs:  Lab Results  Component Value Date   WBC 9.4 06/22/2022   HGB 9.0 (L) 06/22/2022   HCT 30.4 (L) 06/22/2022   MCV 79.8 (L) 06/22/2022   PLT 557 (H) 06/22/2022     Imaging:  CT Abdomen Pelvis W Contrast CLINICAL DATA:  Cervical mass concerning for neoplasm. Vaginal pain and bleeding  EXAM: CT ABDOMEN AND PELVIS WITH CONTRAST  TECHNIQUE: Multidetector CT imaging of the abdomen and pelvis was performed using the standard protocol following bolus administration of intravenous contrast.  RADIATION DOSE REDUCTION: This exam was performed according to the departmental dose-optimization program which includes automated exposure control, adjustment of the mA and/or kV according to patient size and/or use of iterative reconstruction technique.  CONTRAST:  OMNIPAQUE IOHEXOL 300 MG/ML  SOLN  COMPARISON:  None Available.  FINDINGS: Lower chest: Lung bases are clear.  Hepatobiliary: No focal hepatic lesion. Postcholecystectomy. No biliary dilatation.  Pancreas: Pancreas is normal. No ductal dilatation. No pancreatic inflammation.  Spleen: Normal spleen  Adrenals/urinary tract: Adrenal glands and kidneys are normal. The ureters and bladder normal.  Stomach/Bowel: Stomach, small bowel, appendix, and cecum are normal. The colon and rectosigmoid colon are normal.  Vascular/Lymphatic: Abdominal aorta  is normal caliber. No periportal or retroperitoneal adenopathy. No pelvic adenopathy.  Reproductive: The cervical mass is increased in size and now is prolapsed through the vaginal canal and external genitalia. Mass measures 7.2 x 6.4 cm in sagittal dimension (image 66/series 6/sagittal) compared to 7.4 by 4.2 cm. Lesion measures 6.4  cm in craniocaudad dimension compared to 5.8 cm. Visually the mass appears larger. Mass is peripherally enhancing with central low attenuation suggesting necrosis.  Ovaries are normal.  No pelvic lymphadenopathy.  Other: No free fluid.  Musculoskeletal: No aggressive osseous lesion.  IMPRESSION: 1. Large uterine mass prolapse through the vaginal canal and now extending through the external genitalia vestibule. Differential remains aggressive cervical carcinoma versus prolapsed endometrial polyp. Potential interval growth concerning for aggressive neoplasm. Recommend GYN or GYN oncology evaluation during current admission. 2. No metastatic disease evident.  No pelvic lymphadenopathy  Electronically Signed   By: Genevive Bi M.D.   On: 06/22/2022 09:14    Assessment:   1. Cervical mass   2. Pelvic pain   3. Cervical cancer screening   4. History of stroke   5. Iron deficiency anemia, unspecified iron deficiency anemia type   6. Abnormal uterine bleeding      Plan:   1. 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  Punch biopsy performed today of the mass, small amount of bleeding treated with Monsel's solution. Discussion had with patient regarding differential diagnosis, treatment options based on pathology results.   2. Pelvic pain - patient requesting pain medication. Notes she was given small prescription for Oxycodone which did help her pain some.   3. Cervical cancer screening - Patient cannot recall last pap smear, has been several years. Attempted to perform today.  - Cytology - PAP  4. History of stroke - Patient likely needing surgical intervention for excision of pelvic mass, however has h/o strokes in the past, also currently on blood thinners. Would need surgical clearance prior to intervention.   5.  Iron deficiency anemia - Currently taking iron supplements for anemia. Likely secondary to bleeding.   6.  Abnormal vaginal bleeding  - Most likely secondary to pelvic mass, but could also be hormonally related as well. Patient also currently on blood thinners which may be exacerbating her bleeding.      A total of 50 minutes were spent during this encounter, including review of previous progress notes, recent imaging and labs, face-to-face with time with patient involving counseling and coordination of care, as well as documentation for current visit.  Hildred Laser, MD [facility]

## 2022-07-06 ENCOUNTER — Encounter: Payer: Self-pay | Admitting: Obstetrics and Gynecology

## 2022-07-06 ENCOUNTER — Other Ambulatory Visit (HOSPITAL_COMMUNITY)
Admission: RE | Admit: 2022-07-06 | Discharge: 2022-07-06 | Disposition: A | Discharge status: Home / Self Care | Payer: Medicare HMO | Source: Ambulatory Visit | Attending: Obstetrics and Gynecology | Admitting: Obstetrics and Gynecology

## 2022-07-06 ENCOUNTER — Ambulatory Visit (INDEPENDENT_AMBULATORY_CARE_PROVIDER_SITE_OTHER): Payer: Medicare HMO | Admitting: Obstetrics and Gynecology

## 2022-07-06 VITALS — BP 119/88 | HR 132 | Wt 151.2 lb

## 2022-07-06 DIAGNOSIS — Z124 Encounter for screening for malignant neoplasm of cervix: Secondary | ICD-10-CM | POA: Insufficient documentation

## 2022-07-06 DIAGNOSIS — N888 Other specified noninflammatory disorders of cervix uteri: Secondary | ICD-10-CM

## 2022-07-06 DIAGNOSIS — Z8673 Personal history of transient ischemic attack (TIA), and cerebral infarction without residual deficits: Secondary | ICD-10-CM | POA: Diagnosis not present

## 2022-07-06 DIAGNOSIS — Z01419 Encounter for gynecological examination (general) (routine) without abnormal findings: Secondary | ICD-10-CM | POA: Insufficient documentation

## 2022-07-06 DIAGNOSIS — Z1151 Encounter for screening for human papillomavirus (HPV): Secondary | ICD-10-CM | POA: Insufficient documentation

## 2022-07-06 DIAGNOSIS — R102 Pelvic and perineal pain: Secondary | ICD-10-CM | POA: Diagnosis not present

## 2022-07-06 DIAGNOSIS — N939 Abnormal uterine and vaginal bleeding, unspecified: Secondary | ICD-10-CM

## 2022-07-06 DIAGNOSIS — D509 Iron deficiency anemia, unspecified: Secondary | ICD-10-CM

## 2022-07-06 MED ORDER — OXYCODONE HCL 5 MG PO TABS: 5.0000 mg | ORAL_TABLET | Freq: Four times a day (QID) | ORAL | 0 refills | Status: DC | PRN

## 2022-07-07 ENCOUNTER — Encounter: Payer: Self-pay | Admitting: Obstetrics and Gynecology

## 2022-07-07 LAB — SURGICAL PATHOLOGY

## 2022-07-13 ENCOUNTER — Emergency Department
Admission: EM | Admit: 2022-07-13 | Discharge: 2022-07-13 | Disposition: A | Discharge status: Home / Self Care | Payer: Medicare HMO | Attending: Emergency Medicine | Admitting: Emergency Medicine

## 2022-07-13 ENCOUNTER — Encounter: Payer: Self-pay | Admitting: Emergency Medicine

## 2022-07-13 ENCOUNTER — Other Ambulatory Visit: Payer: Self-pay

## 2022-07-13 DIAGNOSIS — Z8673 Personal history of transient ischemic attack (TIA), and cerebral infarction without residual deficits: Secondary | ICD-10-CM | POA: Diagnosis not present

## 2022-07-13 DIAGNOSIS — D649 Anemia, unspecified: Secondary | ICD-10-CM | POA: Diagnosis not present

## 2022-07-13 DIAGNOSIS — D259 Leiomyoma of uterus, unspecified: Secondary | ICD-10-CM | POA: Insufficient documentation

## 2022-07-13 DIAGNOSIS — I10 Essential (primary) hypertension: Secondary | ICD-10-CM | POA: Insufficient documentation

## 2022-07-13 DIAGNOSIS — Z7901 Long term (current) use of anticoagulants: Secondary | ICD-10-CM | POA: Diagnosis not present

## 2022-07-13 DIAGNOSIS — R102 Pelvic and perineal pain: Secondary | ICD-10-CM | POA: Diagnosis not present

## 2022-07-13 LAB — CYTOLOGY - PAP
Comment: NEGATIVE
Diagnosis: NEGATIVE
Diagnosis: REACTIVE
High risk HPV: NEGATIVE

## 2022-07-13 LAB — BASIC METABOLIC PANEL
Anion gap: 7 (ref 5–15)
BUN: 7 mg/dL (ref 6–20)
CO2: 26 mmol/L (ref 22–32)
Calcium: 9.3 mg/dL (ref 8.9–10.3)
Chloride: 101 mmol/L (ref 98–111)
Creatinine, Ser: 0.53 mg/dL (ref 0.44–1.00)
GFR, Estimated: >60 mL/min (ref >60)
Glucose, Bld: 99 mg/dL (ref 70–99)
Potassium: 3.7 mmol/L (ref 3.5–5.1)
Sodium: 134 mmol/L — ABNORMAL LOW (ref 135–145)

## 2022-07-13 LAB — CBC
HCT: 29.7 % — ABNORMAL LOW (ref 36.0–46.0)
Hemoglobin: 8.7 g/dL — ABNORMAL LOW (ref 12.0–15.0)
MCH: 24.5 pg — ABNORMAL LOW (ref 26.0–34.0)
MCHC: 29.3 g/dL — ABNORMAL LOW (ref 30.0–36.0)
MCV: 83.7 fL (ref 80.0–100.0)
Platelets: 427 10*3/uL — ABNORMAL HIGH (ref 150–400)
RBC: 3.55 MIL/uL — ABNORMAL LOW (ref 3.87–5.11)
RDW: 23.5 % — ABNORMAL HIGH (ref 11.5–15.5)
WBC: 11 10*3/uL — ABNORMAL HIGH (ref 4.0–10.5)
nRBC: 0 % (ref 0.0–0.2)

## 2022-07-13 MED ORDER — OXYCODONE HCL 5 MG PO TABS: 5.0000 mg | ORAL_TABLET | ORAL | 0 refills | Status: DC | PRN

## 2022-07-13 MED ORDER — MORPHINE SULFATE (PF) 4 MG/ML IV SOLN
4.0000 mg | Freq: Once | INTRAVENOUS | Status: AC
  Administered 2022-07-13: 4 mg via INTRAVENOUS
  Filled 2022-07-13: qty 1

## 2022-07-13 MED ORDER — OXYCODONE HCL 5 MG PO TABS: 5.0000 mg | ORAL_TABLET | ORAL | 0 refills | Status: AC | PRN

## 2022-07-13 MED ORDER — OXYCODONE-ACETAMINOPHEN 5-325 MG PO TABS: 2.0000 | ORAL_TABLET | Freq: Once | ORAL | Status: DC

## 2022-07-13 NOTE — Discharge Instructions (Addendum)
You were seen in the emergency department for pain from your prolapsed uterine fibroid.  Follow-up with your gynecologist at your scheduled appointment on Friday.  You were given a prescription for pain medication.  Pain control:  Acetaminophen (tylenol) - You can take 2 extra strength tablets (1000 mg) every 6 hours as needed for pain/fever.  If you are still having severe pain you can take an oxycodone.  You were given a prescription for narcotic pain medications.  Take only if in severe pain.  These are very addictive medications.  These medications can make you constipated.  If you need to take more than 1-2 doses, start a stool softner.  If you become constipated, take 1 capfull of MiraLAX, can repeat untill having regular bowel movements.  Keep this medication out of reach of any children.

## 2022-07-13 NOTE — ED Provider Notes (Addendum)
[facility] Provider Note    Event Date/Time   First MD Initiated Contact with Patient 07/13/22 1843     (approximate)   History   Vaginal Prolapse   HPI  Crystal Benitez is a 42 y.o. female past medical history seen again for paroxysmal atrial fibrillation on Eliquis, prior CVA, hypertension, hyperlipidemia, chronic anemia, pelvic mass with prolapse, who presents to the emergency department with pelvic pain.  States that she had a recent biopsy done on the 17th with gynecology.  Currently waiting for the biopsy results to return.  Endorses significant pain that has been progressively worsening over the past 5 days.  States that she no longer has her oxycodone because she only had a week long prescription.  States that she has had ongoing vaginal bleeding.  No fever or chills.  No nausea or vomiting.  Denies any dysuria.  Follow-up appointment on Friday but states that the pain was so severe that she could not wait.     Physical Exam   Triage Vital Signs: ED Triage Vitals  Enc Vitals Group     BP 07/13/22 1833 117/73     Pulse Rate 07/13/22 1833 88     Resp 07/13/22 1833 18     Temp 07/13/22 1833 98.6 F (37 C)     Temp Source 07/13/22 1833 Oral     SpO2 07/13/22 1833 100 %     Weight --      Height --      Head Circumference --      Peak Flow --      Pain Score 07/13/22 1835 10     Pain Loc --      Pain Edu? --      Excl. in GC? --     Most recent vital signs: Vitals:   07/13/22 1833  BP: 117/73  Pulse: 88  Resp: 18  Temp: 98.6 F (37 C)  SpO2: 100%    Physical Exam Constitutional:      General: She is in acute distress.     Appearance: She is well-developed.  HENT:     Head: Atraumatic.  Eyes:     Conjunctiva/sclera: Conjunctivae normal.  Cardiovascular:     Rate and Rhythm: Regular rhythm.  Pulmonary:     Effort: No respiratory distress.  Abdominal:     General: There is no distension.     Tenderness: There is no abdominal  tenderness.  Genitourinary:    Comments: Mass noted in the vaginal vault protruding from cervix. Musculoskeletal:        General: Normal range of motion.     Cervical back: Normal range of motion.  Skin:    General: Skin is warm.  Neurological:     Mental Status: She is alert. Mental status is at baseline.      IMPRESSION / MDM / ASSESSMENT AND PLAN / ED COURSE  I reviewed the triage vital signs and the nursing notes.  On chart review patient had a CT scan done with Dr. Modesto Charon and a follow-up appointment with gynecology approximately 1 week ago with Dr. Valentino Saxon.  Biopsy was obtained at that time.  On chart review appears to have cytology that is consistent with a fibroid and not malignancy.  Differential diagnosis including fibroid with prolapse, torsion, acute on chronic blood loss anemia  Discussed the patient's case with gynecology on-call, I discussed with Shearon Stalls who also discussed with the obstetrician on-call.  Did not recommend repeat imaging  at this time.  Recommended pain control and follow-up on Friday in clinic with her scheduled appointment for likely discussion of surgery     Labs (all labs ordered are listed, but only abnormal results are displayed) Labs interpreted as -    Labs Reviewed  CBC - Abnormal; Notable for the following components:      Result Value   WBC 11.0 (*)    RBC 3.55 (*)    Hemoglobin 8.7 (*)    HCT 29.7 (*)    MCH 24.5 (*)    MCHC 29.3 (*)    RDW 23.5 (*)    Platelets 427 (*)    All other components within normal limits  BASIC METABOLIC PANEL - Abnormal; Notable for the following components:   Sodium 134 (*)    All other components within normal limits      Given IV morphine for pain control.  Anemia but does not meet criteria for blood transfusion.  Pain improved with IV morphine.  On chart review was given 7 days of pain medication.  Limited on taking any NSAIDs given her intact coagulation.  Will do short course of oxycodone.   Patient will follow-up with gynecology on Friday.  Given return precautions.  PROCEDURES:  Critical Care performed: No  Procedures  Patient's presentation is most consistent with acute presentation with potential threat to life or bodily function.   MEDICATIONS ORDERED IN ED: Medications  morphine (PF) 4 MG/ML injection 4 mg (4 mg Intravenous Given 07/13/22 1909)    FINAL CLINICAL IMPRESSION(S) / ED DIAGNOSES   Final diagnoses:  Uterine leiomyoma, unspecified location  Anemia, unspecified type     Rx / DC Orders   ED Discharge Orders          Ordered    oxyCODONE (ROXICODONE) 5 MG immediate release tablet  Every 4 hours PRN,   Status:  Discontinued        07/13/22 2004    oxyCODONE (ROXICODONE) 5 MG immediate release tablet  Every 4 hours PRN        07/13/22 2028             Note:  This document was prepared using Dragon voice recognition software and may include unintentional dictation errors.   Corena Herter, MD 07/13/22 Malvin Johns    Corena Herter, MD 07/13/22 2030

## 2022-07-13 NOTE — ED Triage Notes (Signed)
Patient to ED for prolapse uterus. Pain for the past 5 days.

## 2022-07-14 DIAGNOSIS — F411 Generalized anxiety disorder: Secondary | ICD-10-CM | POA: Diagnosis not present

## 2022-07-14 DIAGNOSIS — R202 Paresthesia of skin: Secondary | ICD-10-CM | POA: Diagnosis not present

## 2022-07-14 DIAGNOSIS — R262 Difficulty in walking, not elsewhere classified: Secondary | ICD-10-CM | POA: Diagnosis not present

## 2022-07-14 DIAGNOSIS — R200 Anesthesia of skin: Secondary | ICD-10-CM | POA: Diagnosis not present

## 2022-07-14 DIAGNOSIS — I69354 Hemiplegia and hemiparesis following cerebral infarction affecting left non-dominant side: Secondary | ICD-10-CM | POA: Diagnosis not present

## 2022-07-14 DIAGNOSIS — R296 Repeated falls: Secondary | ICD-10-CM | POA: Diagnosis not present

## 2022-07-14 DIAGNOSIS — I639 Cerebral infarction, unspecified: Secondary | ICD-10-CM | POA: Diagnosis not present

## 2022-07-14 DIAGNOSIS — R531 Weakness: Secondary | ICD-10-CM | POA: Diagnosis not present

## 2022-07-14 DIAGNOSIS — I10 Essential (primary) hypertension: Secondary | ICD-10-CM | POA: Diagnosis not present

## 2022-07-14 DIAGNOSIS — R413 Other amnesia: Secondary | ICD-10-CM | POA: Diagnosis not present

## 2022-07-14 NOTE — Progress Notes (Unsigned)
    GYNECOLOGY PROGRESS NOTE  Subjective:    Patient ID: Crystal Benitez, female    DOB: 27-Mar-1980, 42 y.o.   MRN: [id number]  HPI  Patient is a 42 y.o. female who presents for follow up cervical mass and abnormal uterine bleeding. Had biopsy of cervical mass last week and is following up for results. Patient notes that she is miserable, still having pain but is controlled with current pain medication.  The following portions of the patient's history were reviewed and updated as appropriate: allergies, current medications, past family history, past medical history, past social history, past surgical history, and problem list.  Review of Systems Pertinent items noted in HPI and remainder of comprehensive ROS otherwise negative.   Objective:   Blood pressure 108/80, pulse 92, resp. rate 16, height 5\' 5"  (1.651 m), weight 150 lb 11.2 oz (68.4 kg). Body mass index is 25.08 kg/m. General appearance: alert and no distress Remainder of exam deferred.    Labs:  FINAL MICROSCOPIC DIAGNOSIS:   A. CERVIX, MASS, BIOPSY:  Inflamed fibromuscular stroma and acute inflammatory exudate.  Negative for malignancy.  See comment.   COMMENT:  The differential diagnosis includes the tip of a prolapsing fibroid;  however, the findings are not definitively diagnostic in this biopsy and  clinical correlation is essential.    Assessment:   1. Cervical mass   2. Pelvic pain   3. History of stroke   4. Iron deficiency anemia, unspecified iron deficiency anemia type   5. Abnormal uterine bleeding   6. PAF (paroxysmal atrial fibrillation) (HCC)      Plan:   Discussion had with patient regarding biopsy results. 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  Discussed likely need for surgical intervention due to size of myoma and symptoms. Initially discussed myomectomy, however patient notes that she would prefer to have a hysterectomy. States that she has been  dealing with issues of abnormal bleeding with anemia for the past 2 years and desires to be done with it. Would also prefer for her ovaries to be removed as she has issues with ovarian cysts in the past. Discussed that ovarian removal would place patient in surgical menopause and reviewed benefits of ovarian preservation, she reports that she has already been experiencing symptoms of perimenopause and would prefer removal of everything in one surgery.   Patient with h/o Afib, currently on anticoagulants. Also with history of stroke.  Discussed that if more invasive surgical intervention is desired, would require medical clearance from Neurologist/Cardiologist. If recommendations for tertiary care recommended, can refer out for surgery.  Patient notes understanding.    A total of 15 minutes were spent face-to-face with the patient during this encounter and over half of that time dealt with counseling and coordination of care.   Hildred Laser, MD [facility]

## 2022-07-15 ENCOUNTER — Encounter: Payer: Self-pay | Admitting: Obstetrics and Gynecology

## 2022-07-15 ENCOUNTER — Ambulatory Visit (INDEPENDENT_AMBULATORY_CARE_PROVIDER_SITE_OTHER): Payer: Medicare HMO | Admitting: Obstetrics and Gynecology

## 2022-07-15 VITALS — BP 108/80 | HR 92 | Resp 16 | Ht 65.0 in | Wt 150.7 lb

## 2022-07-15 DIAGNOSIS — R102 Pelvic and perineal pain: Secondary | ICD-10-CM | POA: Diagnosis not present

## 2022-07-15 DIAGNOSIS — D509 Iron deficiency anemia, unspecified: Secondary | ICD-10-CM

## 2022-07-15 DIAGNOSIS — N888 Other specified noninflammatory disorders of cervix uteri: Secondary | ICD-10-CM

## 2022-07-15 DIAGNOSIS — N939 Abnormal uterine and vaginal bleeding, unspecified: Secondary | ICD-10-CM

## 2022-07-15 DIAGNOSIS — Z8673 Personal history of transient ischemic attack (TIA), and cerebral infarction without residual deficits: Secondary | ICD-10-CM

## 2022-07-15 DIAGNOSIS — I480 Paroxysmal atrial fibrillation: Secondary | ICD-10-CM

## 2022-07-18 ENCOUNTER — Other Ambulatory Visit: Payer: Self-pay

## 2022-07-18 ENCOUNTER — Encounter: Payer: Self-pay | Admitting: Obstetrics and Gynecology

## 2022-07-18 ENCOUNTER — Emergency Department
Admission: EM | Admit: 2022-07-18 | Discharge: 2022-07-19 | Disposition: A | Discharge status: Home / Self Care | Payer: Medicare HMO | Attending: Emergency Medicine | Admitting: Emergency Medicine

## 2022-07-18 DIAGNOSIS — F4325 Adjustment disorder with mixed disturbance of emotions and conduct: Secondary | ICD-10-CM | POA: Insufficient documentation

## 2022-07-18 DIAGNOSIS — F17210 Nicotine dependence, cigarettes, uncomplicated: Secondary | ICD-10-CM | POA: Insufficient documentation

## 2022-07-18 DIAGNOSIS — R45850 Homicidal ideations: Secondary | ICD-10-CM | POA: Diagnosis not present

## 2022-07-18 DIAGNOSIS — N812 Incomplete uterovaginal prolapse: Secondary | ICD-10-CM | POA: Insufficient documentation

## 2022-07-18 DIAGNOSIS — I639 Cerebral infarction, unspecified: Secondary | ICD-10-CM | POA: Diagnosis present

## 2022-07-18 DIAGNOSIS — N8185 Cervical stump prolapse: Secondary | ICD-10-CM | POA: Diagnosis not present

## 2022-07-18 DIAGNOSIS — D509 Iron deficiency anemia, unspecified: Secondary | ICD-10-CM | POA: Diagnosis not present

## 2022-07-18 DIAGNOSIS — I10 Essential (primary) hypertension: Secondary | ICD-10-CM | POA: Insufficient documentation

## 2022-07-18 DIAGNOSIS — Z8673 Personal history of transient ischemic attack (TIA), and cerebral infarction without residual deficits: Secondary | ICD-10-CM | POA: Insufficient documentation

## 2022-07-18 LAB — COMPREHENSIVE METABOLIC PANEL
ALT: 12 U/L (ref 0–44)
AST: 10 U/L — ABNORMAL LOW (ref 15–41)
Albumin: 3.2 g/dL — ABNORMAL LOW (ref 3.5–5.0)
Alkaline Phosphatase: 102 U/L (ref 38–126)
Anion gap: 10 (ref 5–15)
BUN: 14 mg/dL (ref 6–20)
CO2: 25 mmol/L (ref 22–32)
Calcium: 9.4 mg/dL (ref 8.9–10.3)
Chloride: 101 mmol/L (ref 98–111)
Creatinine, Ser: 0.49 mg/dL (ref 0.44–1.00)
GFR, Estimated: >60 mL/min (ref >60)
Glucose, Bld: 92 mg/dL (ref 70–99)
Potassium: 3.7 mmol/L (ref 3.5–5.1)
Sodium: 136 mmol/L (ref 135–145)
Total Bilirubin: 0.3 mg/dL (ref 0.3–1.2)
Total Protein: 7 g/dL (ref 6.5–8.1)

## 2022-07-18 LAB — CBC
HCT: 28.5 % — ABNORMAL LOW (ref 36.0–46.0)
Hemoglobin: 8.6 g/dL — ABNORMAL LOW (ref 12.0–15.0)
MCH: 25 pg — ABNORMAL LOW (ref 26.0–34.0)
MCHC: 30.2 g/dL (ref 30.0–36.0)
MCV: 82.8 fL (ref 80.0–100.0)
Platelets: 441 10*3/uL — ABNORMAL HIGH (ref 150–400)
RBC: 3.44 MIL/uL — ABNORMAL LOW (ref 3.87–5.11)
RDW: 21.7 % — ABNORMAL HIGH (ref 11.5–15.5)
WBC: 12.5 10*3/uL — ABNORMAL HIGH (ref 4.0–10.5)
nRBC: 0 % (ref 0.0–0.2)

## 2022-07-18 LAB — SALICYLATE LEVEL: Salicylate Lvl: <7 mg/dL — ABNORMAL LOW (ref 7.0–30.0)

## 2022-07-18 LAB — ETHANOL: Alcohol, Ethyl (B): <10 mg/dL (ref <10)

## 2022-07-18 LAB — ACETAMINOPHEN LEVEL: Acetaminophen (Tylenol), Serum: <10 ug/mL — ABNORMAL LOW (ref 10–30)

## 2022-07-18 NOTE — ED Notes (Addendum)
Pt dressed out with this RN and stephanie NT. Belongings include: Jeans Black shoes Engineer, water for ankle bracelet   Pt has brief on for vaginal bleeding Pt has ankle monitor on
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
## 2022-07-18 NOTE — ED Notes (Signed)
Pt to [facility], report to [employer], Charity fundraiser

## 2022-07-18 NOTE — ED Notes (Signed)
ivc by rha.Marland Kitchen
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
## 2022-07-18 NOTE — ED Notes (Signed)
ivc/psych consult ordered/pending.. 

## 2022-07-18 NOTE — ED Notes (Signed)
Pt sleep, will offer snack when pt wakes up.
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
## 2022-07-19 DIAGNOSIS — F4325 Adjustment disorder with mixed disturbance of emotions and conduct: Secondary | ICD-10-CM | POA: Diagnosis not present

## 2022-07-19 DIAGNOSIS — N812 Incomplete uterovaginal prolapse: Secondary | ICD-10-CM | POA: Insufficient documentation

## 2022-07-19 LAB — CBC
HCT: 29.6 % — ABNORMAL LOW (ref 36.0–46.0)
Hemoglobin: 8.8 g/dL — ABNORMAL LOW (ref 12.0–15.0)
MCH: 24.8 pg — ABNORMAL LOW (ref 26.0–34.0)
MCHC: 29.7 g/dL — ABNORMAL LOW (ref 30.0–36.0)
MCV: 83.4 fL (ref 80.0–100.0)
Platelets: 490 10*3/uL — ABNORMAL HIGH (ref 150–400)
RBC: 3.55 MIL/uL — ABNORMAL LOW (ref 3.87–5.11)
RDW: 21.3 % — ABNORMAL HIGH (ref 11.5–15.5)
WBC: 9.2 10*3/uL (ref 4.0–10.5)
nRBC: 0 % (ref 0.0–0.2)

## 2022-07-19 MED ORDER — SERTRALINE HCL 100 MG PO TABS
100.0000 mg | ORAL_TABLET | Freq: Every day | ORAL | Status: DC
  Administered 2022-07-19: 100 mg via ORAL
  Filled 2022-07-19: qty 1

## 2022-07-19 MED ORDER — DIVALPROEX SODIUM 125 MG PO DR TAB
125.0000 mg | DELAYED_RELEASE_TABLET | Freq: Two times a day (BID) | ORAL | Status: DC
  Administered 2022-07-19: 125 mg via ORAL
  Filled 2022-07-19: qty 1

## 2022-07-19 MED ORDER — CLOPIDOGREL BISULFATE 75 MG PO TABS
75.0000 mg | ORAL_TABLET | Freq: Every day | ORAL | Status: DC
  Administered 2022-07-19: 75 mg via ORAL
  Filled 2022-07-19: qty 1

## 2022-07-19 MED ORDER — ALPRAZOLAM 0.5 MG PO TABS: 0.5000 mg | ORAL_TABLET | Freq: Two times a day (BID) | ORAL | Status: DC

## 2022-07-19 MED ORDER — ATORVASTATIN CALCIUM 20 MG PO TABS
40.0000 mg | ORAL_TABLET | Freq: Every day | ORAL | Status: DC
  Administered 2022-07-19: 40 mg via ORAL
  Filled 2022-07-19: qty 2

## 2022-07-19 MED ORDER — APIXABAN 5 MG PO TABS
5.0000 mg | ORAL_TABLET | Freq: Two times a day (BID) | ORAL | Status: DC
  Administered 2022-07-19: 5 mg via ORAL
  Filled 2022-07-19: qty 1

## 2022-07-19 MED ORDER — FERROUS SULFATE 325 (65 FE) MG PO TABS: 325.0000 mg | ORAL_TABLET | Freq: Every day | ORAL | Status: DC

## 2022-07-19 MED ORDER — METOPROLOL SUCCINATE ER 50 MG PO TB24
25.0000 mg | ORAL_TABLET | Freq: Every day | ORAL | Status: DC
  Administered 2022-07-19: 25 mg via ORAL
  Filled 2022-07-19: qty 1

## 2022-07-19 MED ORDER — GABAPENTIN 300 MG PO CAPS
300.0000 mg | ORAL_CAPSULE | Freq: Two times a day (BID) | ORAL | Status: DC
  Administered 2022-07-19: 300 mg via ORAL
  Filled 2022-07-19: qty 1

## 2022-07-19 NOTE — BH Assessment (Addendum)
Per [facility] Alcario Drought), patient to be referred out of system.  Referral information for Psychiatric Hospitalization faxed to;   [facility] [phone]) No available beds  [facility] [phone]- [phone]),   [facility] [phone]), Facility at capacity  [facility] [phone]), No answer  [facility] [phone] -or- [phone]),   [facility] [phone])  [facility] [phone])
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
## 2022-07-19 NOTE — ED Notes (Addendum)
Pt given shower supplies and shower room unlocked. Pt calm and cooperative with no distress noted.

## 2022-07-19 NOTE — ED Notes (Addendum)
LCSW in to speak with pt.

## 2022-07-19 NOTE — ED Notes (Signed)
Gave pt snack and water  

## 2022-07-19 NOTE — ED Provider Notes (Addendum)
Emergency Medicine Observation Re-evaluation Note  Crystal Benitez is a 42 y.o. female, seen on rounds today.  Pt initially presented to the ED for complaints of IVC  Currently, the patient is is no acute distress. Denies any concerns at this time.  Physical Exam  Blood pressure 107/80, pulse 90, temperature 98.1 F (36.7 C), resp. rate 18, height 5\' 6"  (1.676 m), weight 71.7 kg, SpO2 100 %.  Physical Exam: General: No apparent distress Pulm: Normal WOB Neuro: Moving all extremities Psych: Resting comfortably     ED Course / MDM     I have reviewed the labs performed to date as well as medications administered while in observation.  Recent changes in the last 24 hours include: No acute events overnight.  Hemoglobin stable.  Patient does not meet criteria for medical admission.  Patient needs outpatient hysterectomy which is already been discussed with the patient and she is followed by gynecology.  Plan to get a hold of patient's mother to determine if the patient could have her IVC reversed and discharged home.  Plan   Current plan: Patient awaiting inpatient psychiatric hospitalization.  Psychiatry recommended inpatient placement. Patient is under full IVC at this time.    Corena Herter, MD 07/19/22 1610    Corena Herter, MD 07/19/22 1538

## 2022-07-19 NOTE — ED Notes (Addendum)
NP at the bedside to discuss pt currently symptoms and pt plan of care

## 2022-07-19 NOTE — ED Notes (Signed)
Dr, Toni Amend at the bedside for pt evaluation and to discuss plan of care

## 2022-07-19 NOTE — ED Notes (Signed)
Breakfast tray and juice provided 
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
## 2022-07-19 NOTE — ED Notes (Addendum)
Lunch tray and water provided in pt room; pt resting quietly in room with lights off. No distress noted at this time.

## 2022-07-19 NOTE — ED Notes (Signed)
Pt to nurses station c/o heavy vaginal bleeding. Pt provided with wipes and clean undergarments.

## 2022-08-03 ENCOUNTER — Telehealth: Payer: Self-pay

## 2022-08-03 ENCOUNTER — Other Ambulatory Visit: Payer: Self-pay | Admitting: Obstetrics and Gynecology

## 2022-08-03 DIAGNOSIS — R102 Pelvic and perineal pain: Secondary | ICD-10-CM

## 2022-08-03 DIAGNOSIS — D509 Iron deficiency anemia, unspecified: Secondary | ICD-10-CM

## 2022-08-03 DIAGNOSIS — D219 Benign neoplasm of connective and other soft tissue, unspecified: Secondary | ICD-10-CM

## 2022-08-03 DIAGNOSIS — I639 Cerebral infarction, unspecified: Secondary | ICD-10-CM

## 2022-08-03 DIAGNOSIS — I10 Essential (primary) hypertension: Secondary | ICD-10-CM

## 2022-08-03 DIAGNOSIS — I480 Paroxysmal atrial fibrillation: Secondary | ICD-10-CM

## 2022-08-03 DIAGNOSIS — N939 Abnormal uterine and vaginal bleeding, unspecified: Secondary | ICD-10-CM

## 2022-08-03 MED ORDER — OXYCODONE-ACETAMINOPHEN 5-325 MG PO TABS: 1.0000 | ORAL_TABLET | Freq: Four times a day (QID) | ORAL | 0 refills | Status: DC | PRN

## 2022-08-03 NOTE — Telephone Encounter (Signed)
Please inform that I have heard back from her Cardiologist, but not from her Neurologist regarding if it is safe for her to undergo surgery. I will reach out again to them and if she is able to get information from them she can send it to me as well.  In the meantime I will refill her medication.

## 2022-08-03 NOTE — Telephone Encounter (Signed)
Patient contacted office stating that she is scheduled to have a hysterectomy and has been in a lot of pain do to mass. Patient states that she has been dilated for months and has been taking otc medication such as Tylenol with no relief. Patient describes pain as sharp and states at times she is unable to move and pain is bringing her to tears. Patient is requesting that Dr. Valentino Saxon send her medication for pain relief until surgery. Patient is requesting a call back before end of business day at [phone]. Patient states that she uses [facility], community in [place]. Please review chart and advise. KW

## 2022-08-03 NOTE — Telephone Encounter (Signed)
Patient has been advised as below and verbalizes understanding. KW

## 2022-08-03 NOTE — Telephone Encounter (Signed)
Left message for return call back. KW

## 2022-08-19 DIAGNOSIS — R296 Repeated falls: Secondary | ICD-10-CM | POA: Diagnosis not present

## 2022-08-23 ENCOUNTER — Telehealth: Payer: Self-pay | Admitting: Obstetrics and Gynecology

## 2022-08-23 NOTE — Telephone Encounter (Signed)
Contacted patient to inform her that per Dr.Cherry she has been cleared by neurology to proceed with surgery and to schedule pre op appointment if she wishes to proceed. No answer, phone prompts no voicemail-try call again later. Patient has no active mychart. Will try contacting patient at a later time.

## 2022-08-25 ENCOUNTER — Telehealth: Payer: Self-pay | Admitting: Obstetrics and Gynecology

## 2022-08-25 NOTE — Telephone Encounter (Signed)
Contacted patient to follow on whether she is interested in scheduling a pre op appointment for surgery now that she has been cleared by neurology. No answer, No VM prompts to leave a message, States to try call again later.

## 2022-09-21 NOTE — Progress Notes (Signed)
GYNECOLOGY PREOPERATIVE HISTORY AND PHYSICAL   Subjective:  Crystal Benitez is a 42 y.o. 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  Bleeding has been ongoing for the past 3 months consistently, however prior to this, her cycles have been irregular but heavy and prolonged when they do occur over the past 2 years. Significant preoperative concerns include h/o stroke, HTN, paroxsymal atrial fibrillation.  Patient has received clearance for surgery by both her Neurologist and Cardiologist (with recommendations to hold anti-platelet therapy for shortest time possible for surgery).  Today notes that she has been experiencing pelvic pain and desires a refill of her pain medication.   Proposed surgery: LAVH with excision of cervical mass, bilateral salpingectomy    Pertinent Gynecological History: Menses:  see HPI Bleeding: dysfunctional uterine bleeding Contraception: tubal ligation Last mammogram:  Overdue   Last pap: normal Date: 07/06/2022   Past Medical History:  Diagnosis Date   Depression with anxiety    HLD (hyperlipidemia)    Hypertension    PAF (paroxysmal atrial fibrillation) (HCC)    Stroke [facility])     Past Surgical History:  Procedure Laterality Date   CHOLECYSTECTOMY     TEE WITHOUT CARDIOVERSION N/A 03/02/2020   Procedure: TRANSESOPHAGEAL ECHOCARDIOGRAM (TEE);  Surgeon: Dalia Heading, MD;  Location: [facility] ORS;  Service: Cardiovascular;  Laterality: N/A;   TUBAL LIGATION      OB History  Gravida Para Term Preterm AB Living  3 3 3     3   SAB IAB Ectopic Multiple Live Births               # Outcome Date GA Lbr Len/2nd Weight Sex Delivery Anes PTL Lv  3 Term      Vag-Spont     2 Term      Vag-Spont     1 Term      Vag-Spont       Family History  Problem Relation Age of Onset   Hypertension Mother    Hyperlipidemia Mother    Cancer  Father    Pancreatic cancer Maternal Grandmother    Breast cancer Paternal Grandmother     Social History   Socioeconomic History   Marital status: Single    Spouse name: Not on file   Number of children: Not on file   Years of education: Not on file   Highest education level: Not on file  Occupational History   Not on file  Tobacco Use   Smoking status: Every Day    Packs/day: .5    Types: Cigarettes   Smokeless tobacco: Never  Substance and Sexual Activity   Alcohol use: Yes    Comment: occasional    Drug use: No   Sexual activity: Not Currently  Other Topics Concern   Not on file  Social History Narrative   Not on file   Social Determinants of Health   Financial Resource Strain: Not on file  Food Insecurity: Patient Declined (05/13/2022)   Hunger Vital Sign    Worried About Running Out of Food in the Last Year: Patient declined    Ran Out of Food in the Last Year: Patient declined  Transportation Needs: Patient Declined (05/13/2022)   PRAPARE - Administrator, Civil Service (Medical): Patient declined    Lack of Transportation (Non-Medical): Patient declined  Physical Activity: Not on file  Stress: Not on file  Social Connections: Not on file  Intimate Partner Violence: Patient Declined (05/13/2022)   Humiliation, Afraid, Rape, and Kick questionnaire    Fear of Current or Ex-Partner: Patient declined    Emotionally Abused: Patient declined    Physically Abused: Patient declined    Sexually Abused: Patient declined    Current Outpatient Medications on File Prior to Visit  Medication Sig Dispense Refill   ALPRAZolam (XANAX) 0.5 MG tablet Take 0.5 mg by mouth 2 (two) times daily as needed.     atorvastatin (LIPITOR) 40 MG tablet Take 40 mg by mouth daily.     butalbital-acetaminophen-caffeine (FIORICET) 50-325-40 MG tablet Take 1 tablet at headache onset, can repeat after 4 hours. No more than 2 pills in 24 hours. Do not take more than 2-3 times a week  MAXIMUM.     clopidogrel (PLAVIX) 75 MG tablet Take 75 mg by mouth daily.     divalproex (DEPAKOTE) 125 MG DR tablet Take 1 tablet (125 mg total) by mouth every 12 (twelve) hours. 60 tablet 0   ELIQUIS 5 MG TABS tablet Take 5 mg by mouth 2 (two) times daily.     ferrous sulfate 325 (65 FE) MG tablet Take 1 tablet (325 mg total) by mouth daily with breakfast. 30 tablet 0   gabapentin (NEURONTIN) 300 MG capsule Take 300 mg twice daily     metoprolol succinate (TOPROL-XL) 25 MG 24 hr tablet Take 25 mg by mouth daily.     oxyCODONE-acetaminophen (PERCOCET/ROXICET) 5-325 MG tablet Take 1-2 tablets by mouth every 6 (six) hours as needed for severe pain. 30 tablet 0   polyethylene glycol (MIRALAX) 17 g packet Take 17 g by mouth 2 (two) times daily. (Patient not taking: Reported on 07/18/2022) 14 each 0   sertraline (ZOLOFT) 100 MG tablet Take 100 mg by mouth daily.     No current facility-administered medications on file prior to visit.   No Known Allergies    Review of Systems Constitutional: No recent fever/chills/sweats Respiratory: No recent cough/bronchitis Cardiovascular: No chest pain Gastrointestinal: No recent nausea/vomiting/diarrhea Genitourinary: No UTI symptoms Hematologic/lymphatic:No history of coagulopathy or recent blood thinner use    Objective:   Blood pressure 113/71, pulse 76, resp. rate 16, height 5\' 5"  (1.651 m), weight 150 lb 6.4 oz (68.2 kg).  Body mass index is 25.03 kg/m.  CONSTITUTIONAL: Well-developed, well-nourished female in no acute distress. Appears older than stated age.  HENT:  Normocephalic, atraumatic, External right and left ear normal. Oropharynx is clear and moist EYES: Conjunctivae and EOM are normal. Pupils are equal, round, and reactive to light. No scleral icterus.  NECK: Normal range of motion, supple, no masses SKIN: Skin is warm and dry. No rash noted. Not diaphoretic. No erythema. No pallor. NEUROLOGIC: Alert and oriented to person, place,  and time. Normal reflexes, muscle tone coordination. No cranial nerve deficit noted. PSYCHIATRIC: Normal mood and affect. Normal behavior. Normal judgment and thought content. CARDIOVASCULAR: Normal heart rate noted, regular rhythm RESPIRATORY: Effort and breath sounds normal, no problems with respiration noted ABDOMEN: Soft, nontender, nondistended. PELVIC: Deferred MUSCULOSKELETAL: Normal range of motion. No edema and no tenderness. 2+ distal pulses.    Labs: No results found for this or any previous visit (from the past 336 hour(s)).  Lab Results  Component Value Date   WBC 9.2 07/19/2022   HGB 8.8 (L) 07/19/2022   HCT 29.6 (L) 07/19/2022   MCV 83.4 07/19/2022   PLT 490 (H) 07/19/2022    Lab Results  Component Value Date   CREATININE 0.49 07/18/2022   BUN 14 07/18/2022   NA 136 07/18/2022   K 3.7 07/18/2022   CL 101 07/18/2022   CO2 25 07/18/2022    Lab Results  Component Value Date   ALT 12 07/18/2022   AST 10 (L) 07/18/2022   ALKPHOS 102 07/18/2022   BILITOT 0.3 07/18/2022    Pathology:  Endometrial biopsy unable to be performed due to obstructing cervical mass   Imaging Studies: CT Abdomen Pelvis W Contrast (performed 06/22/2022) CLINICAL DATA:  Cervical mass concerning for neoplasm. Vaginal pain and bleeding  EXAM: CT ABDOMEN AND PELVIS WITH CONTRAST  TECHNIQUE: Multidetector CT imaging of the abdomen and pelvis was performed using the standard protocol following bolus administration of intravenous contrast.  RADIATION DOSE REDUCTION: This exam was performed according to the departmental dose-optimization program which includes automated exposure control, adjustment of the mA and/or kV according to patient size and/or use of iterative reconstruction technique.  CONTRAST:  OMNIPAQUE IOHEXOL 300 MG/ML  SOLN  COMPARISON:  None Available.  FINDINGS: Lower chest: Lung bases are clear.  Hepatobiliary: No focal hepatic lesion.  Postcholecystectomy. No biliary dilatation.  Pancreas: Pancreas is normal. No ductal dilatation. No pancreatic inflammation.  Spleen: Normal spleen  Adrenals/urinary tract: Adrenal glands and kidneys are normal. The ureters and bladder normal.  Stomach/Bowel: Stomach, small bowel, appendix, and cecum are normal. The colon and rectosigmoid colon are normal.  Vascular/Lymphatic: Abdominal aorta is normal caliber. No periportal or retroperitoneal adenopathy. No pelvic adenopathy.  Reproductive: The cervical mass is increased in size and now is prolapsed through the vaginal canal and external genitalia. Mass measures 7.2 x 6.4 cm in sagittal dimension (image 66/series 6/sagittal) compared to 7.4 by 4.2 cm. Lesion measures 6.4 cm in craniocaudad dimension compared to 5.8 cm. Visually the mass appears larger. Mass is peripherally enhancing with central low attenuation suggesting necrosis.  Ovaries are normal.  No pelvic lymphadenopathy.  Other: No free fluid.  Musculoskeletal: No aggressive osseous lesion.  IMPRESSION: 1. Large uterine mass prolapse through the vaginal canal and now extending through the external genitalia vestibule. Differential remains aggressive cervical carcinoma versus prolapsed endometrial polyp. Potential interval growth concerning for aggressive neoplasm. Recommend GYN or GYN oncology evaluation during current admission. 2. No metastatic disease evident.  No pelvic lymphadenopathy  Electronically Signed   By: Genevive Bi M.D.   On: 06/22/2022 09:14    Pelvic Ultrasound (05/13/2022) Narrative & Impression CLINICAL DATA:  Heavy menstrual periods.  History of tubal ligation.   EXAM: TRANSABDOMINAL AND TRANSVAGINAL ULTRASOUND OF PELVIS   TECHNIQUE: Both transabdominal and transvaginal ultrasound examinations of the pelvis were performed. Transabdominal technique was performed for global imaging of the pelvis including uterus, ovaries,  adnexal regions, and pelvic cul-de-sac. It was necessary to proceed with endovaginal exam following the transabdominal exam to visualize the endometrium and ovaries.   COMPARISON:  None Available.   FINDINGS: Uterus   Measurements: 9.8 x 6.4 x 7.2 cm = volume: 237 mL. Heterogeneous myometrium with multiple uterine fibroids. The largest measure 4.1 x 4.9 x 3.8 cm within the posterior uterine fundus-body junction and 1.8 x 1.9 x 1.6 cm within the anterior uterine body.   Endometrium   The endometrium is difficult to visualize separate from the uterine fibroids and heterogeneous myometrium.   Right ovary   Measurements: 3.5 x 1.8 x 2.2 cm = volume: 7 mL. There is a hypoechoic well-circumscribed 1.2 x 1.5 x 1.1 cm avascular likely complex cyst  with internal echoes within the right ovary.   Left ovary   Measurements: 3.8 x 2.5 x 2.6 cm = volume: 13 mL. Within the left ovary there is a moderately thick-walled, regular oval cyst with central anechoic region measuring approximately 1.4 x 1.3 x 1.3 cm.   Other findings   Trace free fluid within the pelvis.   IMPRESSION: 1. Fibroid uterus. The endometrium is difficult to visualize separate from the uterine fibroids and heterogeneous myometrium. If further evaluation is desired, consider MRI of the pelvis with contrast. 2. Complex bilateral individual ovarian cysts. Recommend follow-up ultrasound in 6-12 weeks (preferably at a different portion of the patient's menstrual cycle) to assess for resolution.     Electronically Signed   By: Neita Garnet M.D.   On: 05/13/2022 21:33     Assessment:   1. Preoperative exam for gynecologic surgery   2. Abnormal uterine bleeding   3. Pelvic pain   4. Iron deficiency anemia, unspecified iron deficiency anemia type   5. Fibroid tumor   6. PAF (paroxysmal atrial fibrillation) (HCC)   7. Hypertension, unspecified type   8. History of stroke   9. Tobacco abuse   10. Encounter for  screening mammogram for malignant neoplasm of breast      Plan:   - Patient desires definitive management with hysterectomy.  I proposed doing a laparoscopic-assisted vaginal hysterectomy (LAVH) and prophylactic bilateral salpingectomy.  No indication for oophorectomy.  Will likely have to perform vaginal myomectomy prior to hysterectomy due to large cervical mass. Patient agrees with this proposed surgery.  The risks of surgery were discussed in detail with the patient including but not limited to: bleeding which may require transfusion or reoperation; infection which may require antibiotics; injury to bowel, bladder, ureters or other surrounding organs; need for additional procedures including laparotomy or subsequent procedures secondary to abnormal pathology; formation of adhesions; thromboembolic phenomenon; incisional problems and other postoperative/anesthesia complications.  Patient was also advised that she will remain in house for 1 night; and expected recovery time after a hysterectomy is 6-8 weeks.  Patient was told that the likelihood that her condition and symptoms will be treated effectively with this surgical management was very high; the postoperative expectations were also discussed in detail. The patient also understands the alternative treatment options which were discussed in full. All questions were answered.  She was told that she will be contacted by our surgical scheduler regarding the time and date of her surgery; routine preoperative instructions will be given to her by the preoperative nursing team.  Routine postoperative instructions will be reviewed with the patient in detail after surgery. Bleeding precautions were reviewed. Printed patient education handouts about the procedure was given to the patient to review at home. Surgery scheduled for 10/17/2022.  - Preop testing ordered.   Unable to perform preoperative endometrial biopsy due to obstruction from cervical mass. If true  concern exists, can send pathology for frozen intraoperatively.  - Instructions reviewed, including NPO after midnight. - Discussed patient's Cardiac and neurologic history. Has received clearance from both services. Would recommend holding anti-platelet therapy for 5-7 days prior to surgical intervention.  - Discussed tobacco abuse. Would strongly encourage cessation or cut down prior to surgery. Currently smoking 1 ppd.  Recommend nicotine patches.  Patient notes these have worked will for her before, is ok to restart. Will prescribe.  - Pelvic pain, patient requests refill of pain medication until surgery.  Will refill.  - Advised on use of iron BID  until surgery for anemia.  - Patient has never had a mammogram. Encouraged screening. Order placed.   Hildred Laser, MD [facility]

## 2022-09-23 ENCOUNTER — Ambulatory Visit (INDEPENDENT_AMBULATORY_CARE_PROVIDER_SITE_OTHER): Payer: Medicare HMO | Admitting: Obstetrics and Gynecology

## 2022-09-23 ENCOUNTER — Encounter: Payer: Self-pay | Admitting: Obstetrics and Gynecology

## 2022-09-23 VITALS — BP 113/71 | HR 76 | Resp 16 | Ht 65.0 in | Wt 150.4 lb

## 2022-09-23 DIAGNOSIS — R102 Pelvic and perineal pain: Secondary | ICD-10-CM | POA: Diagnosis not present

## 2022-09-23 DIAGNOSIS — F17210 Nicotine dependence, cigarettes, uncomplicated: Secondary | ICD-10-CM | POA: Diagnosis not present

## 2022-09-23 DIAGNOSIS — I10 Essential (primary) hypertension: Secondary | ICD-10-CM

## 2022-09-23 DIAGNOSIS — Z01818 Encounter for other preprocedural examination: Secondary | ICD-10-CM | POA: Diagnosis not present

## 2022-09-23 DIAGNOSIS — I480 Paroxysmal atrial fibrillation: Secondary | ICD-10-CM

## 2022-09-23 DIAGNOSIS — Z1231 Encounter for screening mammogram for malignant neoplasm of breast: Secondary | ICD-10-CM

## 2022-09-23 DIAGNOSIS — Z8673 Personal history of transient ischemic attack (TIA), and cerebral infarction without residual deficits: Secondary | ICD-10-CM

## 2022-09-23 DIAGNOSIS — Z720 Tobacco use: Secondary | ICD-10-CM

## 2022-09-23 DIAGNOSIS — D509 Iron deficiency anemia, unspecified: Secondary | ICD-10-CM

## 2022-09-23 DIAGNOSIS — N888 Other specified noninflammatory disorders of cervix uteri: Secondary | ICD-10-CM

## 2022-09-23 DIAGNOSIS — N939 Abnormal uterine and vaginal bleeding, unspecified: Secondary | ICD-10-CM

## 2022-09-23 DIAGNOSIS — D219 Benign neoplasm of connective and other soft tissue, unspecified: Secondary | ICD-10-CM

## 2022-09-23 MED ORDER — FERROUS GLUCONATE 324 (38 FE) MG PO TABS: 324.0000 mg | ORAL_TABLET | Freq: Two times a day (BID) | ORAL | 3 refills | Status: DC

## 2022-09-23 MED ORDER — OXYCODONE-ACETAMINOPHEN 5-325 MG PO TABS: 1.0000 | ORAL_TABLET | Freq: Four times a day (QID) | ORAL | 0 refills | Status: DC | PRN

## 2022-09-23 MED ORDER — NICOTINE 21 MG/24HR TD PT24: 21.0000 mg | MEDICATED_PATCH | Freq: Every day | TRANSDERMAL | 0 refills | Status: DC

## 2022-09-23 NOTE — Patient Instructions (Signed)
GYNECOLOGY PRE-OPERATIVE INSTRUCTIONS  You are scheduled for surgery on 10/17/2022.  The name of your procedure is: LAPAROSCOPIC-ASSISTED VAGINAL HYSTERECTOMY (REMOVAL OF UTERUS AND TUBES).   Please read through these instructions carefully regarding preparation for your surgery: Nothing to eat after midnight on the day prior to surgery.  Do not take any medications unless recommended by your provider on day prior to surgery.  Do not take NSAIDs (Motrin, Aleve) or aspirin 7 days prior to surgery.  You may take Tylenol products for minor aches and pains.  You will receive a prescription for pain medications post-operatively.  You will be contacted by phone approximately 1-2 weeks prior to surgery to schedule your pre-operative appointment.  You will need someone to drive you to and from the hospital. You will not be able to drive for 24 hours after surgery You will need to discontinue your Plavix and Eliquis ~ 5-7 days prior to surgery.  Please call the office if you have any questions regarding your upcoming surgery.    Thank you for choosing [facility] at [facility]    Laparoscopically Assisted Vaginal Hysterectomy A laparoscopically assisted vaginal hysterectomy (LAVH) is a surgical procedure to remove the uterus and cervix. Sometimes, the ovaries and fallopian tubes are also removed. This surgery may be done to treat problems such as: Noncancerous growths in the uterus (uterine fibroids) that cause symptoms. A condition that causes the lining of the uterus to grow in other areas (endometriosis). Problems with pelvic support. Cancer of the cervix, ovaries, uterus, or tissue that lines the uterus (endometrium). Excessive bleeding in the uterus. During an LAVH, some of the surgical removal is done through the vagina, and the rest is done through a few small incisions in the abdomen. This technique may be an option for women who are not able to have a traditional vaginal hysterectomy.  After this procedure, you will no longer be able to have a baby, and you will no longer have a menstrual period. Tell a health care provider about: Any allergies you have. All medicines you are taking, including vitamins, herbs, eye drops, creams, and over-the-counter medicines. Any problems you or family members have had with anesthetic medicines. Any blood disorders you have. Any surgeries you have had. Any medical conditions you have. Whether you are pregnant or may be pregnant. What are the risks? Generally, this is a safe procedure. However, problems may occur, including: Bleeding. Infection. Blood clots in the legs or lungs. Having to change from small incisions to open abdominal surgery. Allergic reactions to medicines. Damage to other structures or organs. What happens before the procedure? Staying hydrated Follow instructions from your health care provider about hydration, which may include: Up to 2 hours before the procedure - you may continue to drink clear liquids, such as water, clear fruit juice, black coffee, and plain tea.  Eating and drinking restrictions Follow instructions from your health care provider about eating and drinking, which may include: 8 hours before the procedure - stop eating heavy meals or foods, such as meat, fried foods, or fatty foods. 6 hours before the procedure - stop eating light meals or foods, such as toast or cereal. 6 hours before the procedure - stop drinking milk or drinks that contain milk. 2 hours before the procedure - stop drinking clear liquids. Medicines Take over-the-counter and prescription medicines only as told by your health care provider. You may be asked to take a medicine to empty your colon (bowel preparation). General instructions If you were  asked to do bowel preparation before the procedure, follow instructions from your health care provider. This procedure can affect the way you feel about yourself. Talk with your  health care provider about the physical and emotional changes hysterectomy may cause. Do not use any products that contain nicotine or tobacco for at least 4 weeks before the procedure. These products include cigarettes, chewing tobacco, and vaping devices, such as e-cigarettes. If you need help quitting, ask your health care provider. Plan to have a responsible adult take you home from the hospital or clinic. Plan to have a responsible adult care for you for the time you are told after you leave the hospital or clinic. This is important. Surgery safety Ask your health care provider: How your surgery site will be marked. What steps will be taken to help prevent infection. These steps may include: Removing hair at the surgery site. Washing skin with a germ-killing soap. Receiving antibiotic medicine. What happens during the procedure? An IV will be inserted into one of your veins. You may be given: A medicine to help you relax (sedative). A medicine to make you fall asleep (general anesthetic). A medicine to numb the area (local anesthetic). You may have a flexible tube (catheter) put into your bladder to drain urine. Tight-fitting (compression) stockings will be placed on your legs to promote circulation. Three or four small incisions will be made in your abdomen. An incision will also be made in your vagina. Surgical instruments will be inserted into the small incisions. The uterus and cervix, and possibly the ovaries and fallopian tubes, will be removed through your vagina and through the small incisions in the abdomen. The incisions will then be closed with stitches (sutures), skin glue, or adhesive strips. The procedure may vary among health care providers and hospitals. What happens after the procedure? Your blood pressure, heart rate, breathing rate, and blood oxygen level will be monitored until you leave the hospital or clinic. You will be given pain medicine as needed. You will  most likely return to your usual diet the day after surgery. You may still have the urinary catheter in place for several hours. It will likely be removed the day after surgery. You may have to wear compression stockings. These stockings help to prevent blood clots and reduce swelling in your legs. You will be encouraged to walk as soon as possible. You will also use a device or do breathing exercises to keep your lungs clear. You will need to wear a sanitary pad for vaginal discharge or bleeding. Summary A laparoscopically assisted vaginal hysterectomy (LAVH) is a surgical procedure to remove the uterus and cervix, and sometimes the ovaries and fallopian tubes. Follow instructions from your health care provider about eating and drinking before the procedure. During an LAVH, some of the surgical removal is done through the vagina, and the rest is done through a few small incisions in the abdomen. After the procedure you will be given pain medication as needed and will need to wear a sanitary pad. Plan to have a responsible adult take you home from the hospital or clinic. This information is not intended to replace advice given to you by your health care provider. Make sure you discuss any questions you have with your health care provider. Document Revised: 05/19/2021 Document Reviewed: 11/08/2019 Elsevier Patient Education  2024 [employer].

## 2022-10-05 ENCOUNTER — Encounter: Payer: Self-pay | Admitting: Obstetrics and Gynecology

## 2022-10-10 ENCOUNTER — Inpatient Hospital Stay
Admission: RE | Admit: 2022-10-10 | Discharge: 2022-10-10 | Disposition: A | Discharge status: Home / Self Care | Payer: Medicare HMO | Source: Ambulatory Visit

## 2022-10-10 ENCOUNTER — Telehealth: Payer: Self-pay | Admitting: Obstetrics and Gynecology

## 2022-10-10 HISTORY — DX: Adjustment disorder with mixed disturbance of emotions and conduct: F43.25

## 2022-10-10 HISTORY — DX: Major depressive disorder, single episode, unspecified: F32.9

## 2022-10-10 HISTORY — DX: Unspecified ovarian cyst, right side: N83.201

## 2022-10-10 HISTORY — DX: Iron deficiency anemia, unspecified: D50.9

## 2022-10-10 HISTORY — DX: Nicotine dependence, unspecified, uncomplicated: F17.200

## 2022-10-10 HISTORY — DX: Other thrombocytosis: D75.838

## 2022-10-10 HISTORY — DX: Metabolic encephalopathy: G93.41

## 2022-10-10 HISTORY — DX: Incomplete uterovaginal prolapse: N81.2

## 2022-10-10 NOTE — Telephone Encounter (Signed)
After multiple attempts I have been unsuccessful in reaching this patient to inform of her upcoming appointments and pre admit phone call for surgery. Tried patients emergency contact (mother) as an alternate attempt to reach patient, unsuccessful.

## 2022-10-10 NOTE — Pre-Procedure Instructions (Signed)
After multiple attempts to reach the patient this nurse has been unsuccessful. Called [facility] to inform them that the patient was not contacted. See note 07/22 the office was also unable to reach patient. Office has moved the patient as last case of the day. Patient may possibly be a two hour early if continue to be unreachable.

## 2022-10-11 ENCOUNTER — Telehealth: Payer: Self-pay

## 2022-10-11 ENCOUNTER — Telehealth: Payer: Self-pay | Admitting: Obstetrics and Gynecology

## 2022-10-11 ENCOUNTER — Encounter: Payer: Self-pay | Admitting: Obstetrics and Gynecology

## 2022-10-11 NOTE — Telephone Encounter (Signed)
Crystal Benitez, has contacted the patient x2 on phone numbers on file. Has been unsuccessful in reaching the patient for surgery. Contacting the office to decide if the patient will need to reschedule. Please advise?

## 2022-10-11 NOTE — Telephone Encounter (Signed)
Ok, then we've done all we can do except for showing up at her home to notify her. We will just have to see if she every calls the office back.

## 2022-10-11 NOTE — Telephone Encounter (Signed)
please send letter for patient to contact office ASAP. We've already tried calling and sending Mychart. If she ever calls back please send her to [place].

## 2022-10-11 NOTE — Pre-Procedure Instructions (Signed)
Attempted to call patient's contact number  ( [phone] )and emergency contact number [phone] ) but unsuccessful. Pre admit testing cannot be completed due to inability in reaching patient. Both contact numbers cannot accept messages. Dr' office was notified of the situation. OR was notified as well and waiting for Dr's office to give them instructions.

## 2022-10-11 NOTE — Telephone Encounter (Signed)
Pt's mom calling for pt; pt is having surgery on the 29th; doesn she have any more preop appts to attend?  Mom states she has pt's permission to get her information.  [phone]

## 2022-10-12 ENCOUNTER — Inpatient Hospital Stay
Admission: RE | Admit: 2022-10-12 | Discharge: 2022-10-12 | Disposition: A | Discharge status: Home / Self Care | Payer: Medicare HMO | Source: Ambulatory Visit

## 2022-10-12 ENCOUNTER — Encounter
Admission: RE | Admit: 2022-10-12 | Discharge: 2022-10-12 | Disposition: A | Discharge status: Home / Self Care | Payer: Medicare HMO | Source: Ambulatory Visit | Attending: Obstetrics and Gynecology | Admitting: Obstetrics and Gynecology

## 2022-10-12 VITALS — Ht 65.0 in | Wt 149.0 lb

## 2022-10-12 DIAGNOSIS — Z01818 Encounter for other preprocedural examination: Secondary | ICD-10-CM

## 2022-10-12 HISTORY — DX: Other specified postprocedural states: Z98.890

## 2022-10-12 HISTORY — DX: Nausea with vomiting, unspecified: R11.2

## 2022-10-12 NOTE — Patient Instructions (Addendum)
Your procedure is scheduled on: Monday, July 29 Report to the Registration Desk on the 1st floor of the [facility] To find out your arrival time, please call [phone] between 1PM - 3PM on: Friday July 26 If your arrival time is 6:00 am, do not arrive before that time as the [facility] entrance doors do not open until 6:00 am.  REMEMBER: Instructions that are not followed completely may result in serious medical risk, up to and including death; or upon the discretion of your surgeon and anesthesiologist your surgery may need to be rescheduled.  Do not eat food after midnight the night before surgery.  No gum chewing or hard candies.  You may however, drink CLEAR liquids up to 2 hours before you are scheduled to arrive for your surgery. Do not drink anything within 2 hours of your scheduled arrival time.  Clear liquids include: - water  - apple juice without pulp - gatorade (not RED colors) - black coffee or tea (Do NOT add milk or creamers to the coffee or tea) Do NOT drink anything that is not on this list.   In addition, your doctor has ordered for you to drink the provided:  Ensure Pre-Surgery Clear Carbohydrate Drink  Drinking this carbohydrate drink up to two hours before surgery helps to reduce insulin resistance and improve patient outcomes. Please complete drinking 2 hours before scheduled arrival time.  One week prior to surgery: Stop Anti-inflammatories (NSAIDS) such as Advil, Aleve, Ibuprofen, Motrin, Naproxen, Naprosyn and Aspirin based products such as Excedrin, Goody's Powder, BC Powder.  Stop ANY OVER THE COUNTER supplements until after surgery. You may however, continue to take Tylenol if needed for pain up until the day of surgery.  Continue taking all prescribed medications with the exception of the following: clopidogrel (PLAVIX) hold for 5 days before surgery. Last dose July 23 ELIQUIS hold 2 days before the surgery. Last dose Friday, July 26  Follow  recommendations from Cardiologist or PCP regarding stopping blood thinners.  TAKE ONLY THESE MEDICATIONS THE MORNING OF SURGERY WITH A SIP OF WATER:  ALPRAZolam Prudy Feeler) if needed for anxiety  divalproex (DEPAKOTE)  ferrous sulfate  gabapentin (NEURONTIN)  oxyCODONE-acetaminophen (PERCOCET/ROXICET) if needed for pain   No Alcohol for 24 hours before or after surgery.  No Smoking including e-cigarettes for 24 hours before surgery.  No chewable tobacco products for at least 6 hours before surgery.  No nicotine patches on the day of surgery.  Do not use any "recreational" drugs for at least a week (preferably 2 weeks) before your surgery.  Please be advised that the combination of cocaine and anesthesia may have negative outcomes, up to and including death. If you test positive for cocaine, your surgery will be cancelled.  On the morning of surgery brush your teeth with toothpaste and water, you may rinse your mouth with mouthwash if you wish. Do not swallow any toothpaste or mouthwash.  Use CHG Soap as directed on instruction sheet.  Do not wear jewelry, make-up, hairpins, clips or nail polish.  Do not wear lotions, powders, or perfumes.   Do not shave body hair from the neck down 48 hours before surgery.  Contact lenses, hearing aids and dentures may not be worn into surgery.  Do not bring valuables to the hospital. [facility] is not responsible for any missing/lost belongings or valuables.   Notify your doctor if there is any change in your medical condition (cold, fever, infection).  Wear comfortable clothing (specific to  your surgery type) to the hospital.  After surgery, you can help prevent lung complications by doing breathing exercises.  Take deep breaths and cough every 1-2 hours. Your doctor may order a device called an Incentive Spirometer to help you take deep breaths. When coughing or sneezing, hold a pillow firmly against your incision with both hands. This is  called "splinting." Doing this helps protect your incision. It also decreases belly discomfort.  If you are being admitted to the hospital overnight, leave your suitcase in the car. After surgery it may be brought to your room.  In case of increased patient census, it may be necessary for you, the patient, to continue your postoperative care in the Same Day Surgery department.  If you are taking public transportation, you will need to have a responsible individual with you.  Please call the Pre-admissions Testing Dept. at [phone] if you have any questions about these instructions.  Surgery Visitation Policy:  Patients having surgery or a procedure may have two visitors.  Children under the age of 87 must have an adult with them who is not the patient.  Inpatient Visitation:    Visiting hours are 7 a.m. to 8 p.m. Up to four visitors are allowed at one time in a patient room. The visitors may rotate out with other people during the day.  One visitor age 47 or older may stay with the patient overnight and must be in the room by 8 p.m.     Preparing for Surgery with CHLORHEXIDINE GLUCONATE (CHG) Soap  Chlorhexidine Gluconate (CHG) Soap  o An antiseptic cleaner that kills germs and bonds with the skin to continue killing germs even after washing  o Used for showering the night before surgery and morning of surgery  Before surgery, you can play an important role by reducing the number of germs on your skin.  CHG (Chlorhexidine gluconate) soap is an antiseptic cleanser which kills germs and bonds with the skin to continue killing germs even after washing.  Please do not use if you have an allergy to CHG or antibacterial soaps. If your skin becomes reddened/irritated stop using the CHG.  1. Shower the NIGHT BEFORE SURGERY and the MORNING OF SURGERY with CHG soap.  2. If you choose to wash your hair, wash your hair first as usual with your normal shampoo.  3. After shampooing,  rinse your hair and body thoroughly to remove the shampoo.  4. Use CHG as you would any other liquid soap. You can apply CHG directly to the skin and wash gently with a scrungie or a clean washcloth.  5. Apply the CHG soap to your body only from the neck down. Do not use on open wounds or open sores. Avoid contact with your eyes, ears, mouth, and genitals (private parts). Wash face and genitals (private parts) with your normal soap.  6. Wash thoroughly, paying special attention to the area where your surgery will be performed.  7. Thoroughly rinse your body with warm water.  8. Do not shower/wash with your normal soap after using and rinsing off the CHG soap.  9. Pat yourself dry with a clean towel.  10. Wear clean pajamas to bed the night before surgery.  12. Place clean sheets on your bed the night of your first shower and do not sleep with pets.  13. Shower again with the CHG soap on the day of surgery prior to arriving at the hospital.  14. Do not apply any deodorants/lotions/powders.  15. Please wear clean clothes to the hospital.

## 2022-10-12 NOTE — Pre-Procedure Instructions (Signed)
Unsuccessful attempt to contact patient regarding preadmission interview for surgery scheduled Monday July 29.

## 2022-10-13 ENCOUNTER — Encounter: Payer: Self-pay | Admitting: Urgent Care

## 2022-10-13 ENCOUNTER — Encounter: Payer: Self-pay | Admitting: Obstetrics and Gynecology

## 2022-10-13 ENCOUNTER — Encounter
Admission: RE | Admit: 2022-10-13 | Discharge: 2022-10-13 | Disposition: A | Discharge status: Home / Self Care | Payer: Medicare HMO | Source: Ambulatory Visit | Attending: Obstetrics and Gynecology | Admitting: Obstetrics and Gynecology

## 2022-10-13 DIAGNOSIS — Z01818 Encounter for other preprocedural examination: Secondary | ICD-10-CM

## 2022-10-13 DIAGNOSIS — N939 Abnormal uterine and vaginal bleeding, unspecified: Secondary | ICD-10-CM | POA: Diagnosis not present

## 2022-10-13 DIAGNOSIS — Z01812 Encounter for preprocedural laboratory examination: Secondary | ICD-10-CM | POA: Diagnosis not present

## 2022-10-13 LAB — COMPREHENSIVE METABOLIC PANEL
ALT: 12 U/L (ref 0–44)
AST: 12 U/L — ABNORMAL LOW (ref 15–41)
Albumin: 3.6 g/dL (ref 3.5–5.0)
Alkaline Phosphatase: 70 U/L (ref 38–126)
Anion gap: 7 (ref 5–15)
BUN: 13 mg/dL (ref 6–20)
CO2: 27 mmol/L (ref 22–32)
Calcium: 9.7 mg/dL (ref 8.9–10.3)
Chloride: 103 mmol/L (ref 98–111)
Creatinine, Ser: 0.59 mg/dL (ref 0.44–1.00)
GFR, Estimated: >60 mL/min (ref >60)
Glucose, Bld: 72 mg/dL (ref 70–99)
Potassium: 3.5 mmol/L (ref 3.5–5.1)
Sodium: 137 mmol/L (ref 135–145)
Total Bilirubin: 0.3 mg/dL (ref 0.3–1.2)
Total Protein: 7.5 g/dL (ref 6.5–8.1)

## 2022-10-13 LAB — CBC
HCT: 33.3 % — ABNORMAL LOW (ref 36.0–46.0)
Hemoglobin: 10.1 g/dL — ABNORMAL LOW (ref 12.0–15.0)
MCH: 23.5 pg — ABNORMAL LOW (ref 26.0–34.0)
MCHC: 30.3 g/dL (ref 30.0–36.0)
MCV: 77.6 fL — ABNORMAL LOW (ref 80.0–100.0)
Platelets: 463 10*3/uL — ABNORMAL HIGH (ref 150–400)
RBC: 4.29 MIL/uL (ref 3.87–5.11)
RDW: 17.2 % — ABNORMAL HIGH (ref 11.5–15.5)
WBC: 6.5 10*3/uL (ref 4.0–10.5)
nRBC: 0 % (ref 0.0–0.2)

## 2022-10-13 LAB — TYPE AND SCREEN
ABO/RH(D): A POS
Antibody Screen: NEGATIVE

## 2022-10-13 LAB — HEPATITIS B SURFACE ANTIGEN: Hepatitis B Surface Ag: NONREACTIVE

## 2022-10-13 LAB — HEPATITIS C ANTIBODY: HCV Ab: NONREACTIVE

## 2022-10-13 NOTE — Progress Notes (Signed)
Perioperative / Anesthesia Services  Pre-Admission Testing Clinical Review / Preoperative Anesthesia Consult  Date: 10/14/22  Patient Demographics:  Name: Crystal Benitez DOB:   [birth year] MRN:   [id number]  Planned Surgical Procedure(s):    Case: [id number] Date/Time: 10/17/22 1137   Procedures:      LAPAROSCOPIC ASSISTED VAGINAL HYSTERECTOMY WITH BILATERAL SALPINGECTOMY (Bilateral)     VAGINAL MYOMECTOMY   Anesthesia type: General   Pre-op diagnosis: abnormal uterine bleeding, anemia, Pelvic pain, aborting myoma   Location: [facility] OR ROOM 05 / [facility]   Surgeons: Hildred Laser, MD     NOTE: Available PAT nursing documentation and vital signs have been reviewed. Clinical nursing staff has updated patient's PMH/PSHx, current medication list, and drug allergies/intolerances to ensure comprehensive history available to assist in medical decision making as it pertains to the aforementioned surgical procedure and anticipated anesthetic course. Extensive review of available clinical information personally performed. Wallace PMH and PSHx updated with any diagnoses/procedures that  may have been inadvertently omitted during her intake with the pre-admission testing department's nursing staff.  Clinical Discussion:  Crystal Benitez is a 42 y.o. female who is submitted for pre-surgical anesthesia review and clearance prior to her undergoing the above procedure. Patient is a Current Smoker. Pertinent PMH includes: PAF, diastolic dysfunction, MVP, CVA, HTN, HLD, DOE, iron deficiency anemia, abnormal uterine bleeding, cervical prolapse, cervical mass, BILATERAL ovarian cyst, depression, recurrent falls, anxiety (on BZO).   Patient is followed by cardiology [name], MD). She was last seen in the cardiology clinic on 05/25/2022; notes reviewed. At the time of her clinic visit, patient reporting episodes of occasional heart racing with no associated chest pain or shortness of breath.   Patient recently admitted to the hospital due to AMS and placed under IVC.  Patient found to be significantly anemic with hemoglobin as low 6.1 g/dL.  Patient was treated with intravenous iron and PRBC transfusions.  Since discharge from the hospital, patient has been doing well overall.  She denies any PND, orthopnea, palpitations, significant peripheral edema, or presyncope/syncope.  Patient is experiencing recurrent falls likely secondary to both post CVA weakness and vertiginous symptoms.  Patient with a past medical history significant for cardiovascular diagnoses. Documented physical exam was grossly benign, providing no evidence of acute exacerbation and/or decompensation of the patient's known cardiovascular conditions.  MRI imaging of the brain performed on 02/29/2020 revealed scattered acute infarcts in the RIGHT MCA territory with cytotoxic edema and occasional petechial hemorrhage.  There was no evidence of malignant hemorrhagic transformation or mass effect.  Following neurological event, patient has residual memory deficits and numbness.  TTE with bubble study performed on 03/01/2020 revealing a normal left ventricular systolic function with an EF of 60 to 65%.  There were no regional wall motion abnormalities. Left ventricular diastolic Doppler parameters consistent with abnormal relaxation (G1DD).  Right ventricular size and function normal.  No significant valvular regurgitation noted.  All transvalvular gradients were noted to be normal providing no evidence suggestive of valvular stenosis.  Aorta normal in size with no evidence of aneurysmal dilatation.  Agitated saline contrast bubble study negative with no evidence of intra-atrial shunting.  Bedside TEE performed on 12/31/2019 revealing a normal left ventricular systolic function with an EF of 55 to 60%.  Again there were no regional wall motion abnormalities.  Right ventricular size and function was normal.  No LAA thrombus was detected.   There was trivial mitral valve regurgitation.  Mild holosystolic prolapse of the medial  segment of the anterior leaflet of the mitral valve observed.  Again, there were no significant transvalvular gradient suggestive of stenosis.  Patient with an atrial fibrillation diagnosis; CHA2DS2-VASc Score = 4 (sex, HTN, CVA x 2). Her rate and rhythm are currently being maintained on oral metoprolol succinate. She is chronically anticoagulated using standard dose apixaban.  Additionally, given patient's history of CVA, she is also on antithrombotic therapy using clopidogrel.  She is reportedly compliant with her oral anticoagulation/antithrombotic therapies. Since previous discharge from hospital requiring blood transfusion, patient has not appreciated any evidence of gastrointestinal bleeding.  Blood pressure well-controlled at 126/84 mmHg on currently prescribed beta-blocker monotherapy (metoprolol succinate).  Patient is on atorvastatin for HLD diagnosis and ASCVD prevention.  She is not diabetic.  Patient does not have an OSAH diagnosis. Patient is able to complete all of her  ADL/IADLs without cardiovascular limitation.  Per the [facility], patient is able to achieve at least 4 METS of physical activity without experiencing any significant degree of angina/anginal equivalent symptoms.  No changes were made to her medication regimen.  Patient to follow-up with outpatient cardiology in 6 months or sooner if needed.  [name] underwent CT imaging of the abdomen and pelvis on 06/22/2022 revealing a large uterine mass prolapse through the vaginal canal extending into the external genitalia vestibule.  Mass measured 7.2 x 6.4 in sagittal dimension.  Differential included aggressive cervical carcinoma versus prolapsed endometrial polyp versus large aborting fibroid.  Patient with associated pelvic pain and abnormal uterine bleeding.  Patient has been seen in consultation by her GYN with surgical intervention being  recommended. Patient has subsequently been scheduled for a LAPAROSCOPIC ASSISTED VAGINAL HYSTERECTOMY WITH BILATERAL SALPINGECTOMY; VAGINAL MYOMECTOMY on 10/17/2022 with Dr. Hildred Laser, MD.  Given patient's past medical history significant for both cardiovascular and neurovascular diagnoses, presurgical clearances were sought from patient's specialty care providers.  Per neurology Marlene Bast, PA-C), "patient is compliant with her treatment.  It is my medical opinion that she may proceed with any necessary procedures.  I recommend her antiplatelet therapy be held for the shortest amount of time possible.  She is at overall MODERATE stroke risk, however I do believe that the benefit of this procedure does outweigh the risk".  Per cardiology [name], MD), "this patient is optimized for surgery and may proceed with the planned procedural course with a MILD to MODERATE risk of significant perioperative cardiovascular complications".  Again, due to patient's cardiovascular conditions, she is on both anticoagulation and antithrombotic therapies.  She has been advised on recommendations from her cardiologist for holding her apixaban for 2 days (last dose 10/14/2022) and her clopidogrel for 5 days (last dose 10/12/2022) with plans to restart since postoperatively respectively minimized by her primary attending surgeon.  Patient reports previous perioperative complications with anesthesia in the past. Patient has a PMH (+) for PONV. Symptoms and history of PONV will be discussed with patient by anesthesia team on the day of her procedure. Interventions will be ordered as deemed necessary based on patient's individual care needs as determined by anesthesiologist. In review of the available records, it is noted that patient underwent a general anesthetic course here at [facility] ([facility]) in 02/2020 without documented complications.      10/12/2022    3:01 PM 09/23/2022    9:25 AM  07/19/2022    9:12 AM  Vitals with BMI  Height 5\' 5"  5\' 5"    Weight 149 lbs 150 lbs 6 oz  BMI 24.79 25.03   Systolic  113 108  Diastolic  71 77  Pulse  76 103    Providers/Specialists:   NOTE: Primary physician provider listed below. Patient may have been seen by APP or partner within same practice.   PROVIDER ROLE / SPECIALTY LAST Para Skeans, MD OB/GYN (Surgeon) 09/23/2022  Pcp, No Primary Care Provider ???  Rudean Hitt, MD Cardiology 07/14/2022  Cristopher Peru, MD Neurology 12/17/2021   Allergies:  Patient has no known allergies.  Current Home Medications:   No current facility-administered medications for this encounter.    ALPRAZolam (XANAX) 0.5 MG tablet   atorvastatin (LIPITOR) 40 MG tablet   butalbital-acetaminophen-caffeine (FIORICET) 50-325-40 MG tablet   clopidogrel (PLAVIX) 75 MG tablet   divalproex (DEPAKOTE) 125 MG DR tablet   ELIQUIS 5 MG TABS tablet   ferrous sulfate 325 (65 FE) MG tablet   gabapentin (NEURONTIN) 300 MG capsule   metoprolol succinate (TOPROL-XL) 25 MG 24 hr tablet   oxyCODONE-acetaminophen (PERCOCET/ROXICET) 5-325 MG tablet   polyethylene glycol (MIRALAX) 17 g packet   sertraline (ZOLOFT) 100 MG tablet   History:   Past Medical History:  Diagnosis Date   Abnormal uterine bleeding    Acute CVA (cerebrovascular accident) (HCC) 02/29/2020   a.) MRI brain 02/29/2020: scattered acute infarcts in the RIGHT MCA territory with cytotoxic edema and occasional petechial hemorrhage; no malignant hemorrhagic transformation or mass effect; has residual memory deficits and numbness   Acute metabolic encephalopathy    Adjustment disorder with mixed disturbance of emotions and conduct    Anxiety    a.) on BZO PRN (alprazolam)   Bilateral ovarian cysts    Cervical mass 06/07/2022   a.) CT AP 06/07/2022: ovoid  6.4 x 3.9 x 3.6 cm uterine cervix mass; b.) CT AP 06/22/2022: interval increase in size to 7.2 x 6.4 cm sagittal dimension    Cervical prolapse    Diastolic dysfunction 03/01/2020   a.) TTE bubble study 03/01/2020: EF 60-65%, no RWMAs, no IAS, G1DD; b.) TEE 03/02/2020: EF 55-60%, triv AR, mild holosystolic prolapse of medial segment of anterior leaflet of the MV   Diverticulosis    DOE (dyspnea on exertion)    HLD (hyperlipidemia)    Hypertension    Iron deficiency anemia    Long term (current) use of anticoagulants    a.) apixaban   Long term current use of antithrombotics/antiplatelets    a.) clopidogrel   Major depression    Migraines    MVP (mitral valve prolapse) 03/02/2020   a.) TEE 03/02/2020: mild holosystolic prolapse of medial segment of anterior leaflet of the MV   Nicotine dependence    PAF (paroxysmal atrial fibrillation) (HCC)    a.) CHA2DS2VASc = 4 (sex, HTN, CVA x2);  b.) rate/rhythm maintained on oral metoprolol succinate; chronically anticoagulated with apixaban   PONV (postoperative nausea and vomiting)    Reactive thrombocytosis    Recurrent falls    a.) related to post-CVA weakness and vertiginous symptoms   Past Surgical History:  Procedure Laterality Date   CHOLECYSTECTOMY     TEE WITHOUT CARDIOVERSION N/A 03/02/2020   Procedure: TRANSESOPHAGEAL ECHOCARDIOGRAM (TEE);  Surgeon: Dalia Heading, MD;  Location: [facility] ORS;  Service: Cardiovascular;  Laterality: N/A;   TUBAL LIGATION     Family History  Problem Relation Age of Onset   Hypertension Mother    Hyperlipidemia Mother    Cancer Father    Pancreatic cancer Maternal Grandmother    Breast cancer Paternal  Grandmother    Social History   Tobacco Use   Smoking status: Every Day    Current packs/day: 0.50    Types: Cigarettes   Smokeless tobacco: Never  Vaping Use   Vaping status: Never Used  Substance Use Topics   Alcohol use: Yes    Comment: occasional    Drug use: No    Pertinent Clinical Results:  LABS:   Hospital Outpatient Visit on 10/13/2022  Component Date Value Ref Range Status   WBC 10/13/2022 6.5   4.0 - 10.5 K/uL Final   RBC 10/13/2022 4.29  3.87 - 5.11 MIL/uL Final   Hemoglobin 10/13/2022 10.1 (L)  12.0 - 15.0 g/dL Final   HCT 62/95/2841 33.3 (L)  36.0 - 46.0 % Final   MCV 10/13/2022 77.6 (L)  80.0 - 100.0 fL Final   MCH 10/13/2022 23.5 (L)  26.0 - 34.0 pg Final   MCHC 10/13/2022 30.3  30.0 - 36.0 g/dL Final   RDW 32/44/0102 17.2 (H)  11.5 - 15.5 % Final   Platelets 10/13/2022 463 (H)  150 - 400 K/uL Final   nRBC 10/13/2022 0.0  0.0 - 0.2 % Final   Performed at [facility], [address] [place], [place] [address]   ABO/RH(D) 10/13/2022 A POS   Final   Antibody Screen 10/13/2022 NEG   Final   Sample Expiration 10/13/2022 10/27/2022,2359   Final   Extend sample reason 10/13/2022    Final                   Value:NO TRANSFUSIONS OR PREGNANCY IN THE PAST 3 MONTHS Performed at [facility], [address] [place], [place] [address]    Sodium 10/13/2022 137  135 - 145 mmol/L Final   Potassium 10/13/2022 3.5  3.5 - 5.1 mmol/L Final   Chloride 10/13/2022 103  98 - 111 mmol/L Final   CO2 10/13/2022 27  22 - 32 mmol/L Final   Glucose, Bld 10/13/2022 72  70 - 99 mg/dL Final   Glucose reference range applies only to samples taken after fasting for at least 8 hours.   BUN 10/13/2022 13  6 - 20 mg/dL Final   Creatinine, Ser 10/13/2022 0.59  0.44 - 1.00 mg/dL Final   Calcium 47/42/5956 9.7  8.9 - 10.3 mg/dL Final   Total Protein 38/75/6433 7.5  6.5 - 8.1 g/dL Final   Albumin 29/51/8841 3.6  3.5 - 5.0 g/dL Final   AST 66/08/3014 12 (L)  15 - 41 U/L Final   ALT 10/13/2022 12  0 - 44 U/L Final   Alkaline Phosphatase 10/13/2022 70  38 - 126 U/L Final   Total Bilirubin 10/13/2022 0.3  0.3 - 1.2 mg/dL Final   GFR, Estimated 10/13/2022 >60  >60 mL/min Final   Comment: (NOTE) Calculated using the CKD-EPI Creatinine Equation (2021)    Anion gap 10/13/2022 7  5 - 15 Final   Performed at [facility], [address] [place], [place] [address]   HCV Ab  10/13/2022 NON REACTIVE  NON REACTIVE Final   Comment: (NOTE) Nonreactive HCV antibody screen is consistent with no HCV infections,  unless recent infection is suspected or other evidence exists to indicate HCV infection.  Performed at [facility], [address] [place], [place] [address]    Hepatitis B Surface Ag 10/13/2022 NON REACTIVE  NON REACTIVE Final   Performed at [facility], [address], [place], [place] [address]  RPR Ser Ql 10/13/2022 NON REACTIVE  NON REACTIVE Final   Performed at [facility], [address] [place], [place] [address]    ECG: Date: 07/19/2022 Time ECG obtained: 1213 PM Rate: 73 bpm Rhythm:  Normal sinus rhythm; IRBBB Axis (leads I and aVF): Normal Intervals: PR 166 ms. QRS 102 ms. QTc 416 ms. ST segment and T wave changes: No evidence of acute ST segment elevation or depression.  Evidence of a possible age undetermined anterior infarct present. Comparison: Similar to previous tracing obtained on 05/13/2022   IMAGING / PROCEDURES: CT ABDOMEN PELVIS W CONTRAST performed on 06/22/2022 Large uterine mass prolapse through the vaginal canal and now extending through the external genitalia vestibule. Mass measures 7.2 x 6.4 cm in sagittal dimension. Differential remains aggressive cervical carcinoma versus prolapsed endometrial polyp. Potential interval growth concerning for aggressive neoplasm. Recommend GYN or GYN oncology evaluation during current admission. No metastatic disease evident.  No pelvic lymphadenopathy  CT HEAD WO CONTRAST performed on 06/07/2022 No evidence of acute intracranial abnormality. Remote right parieto-occipital infarcts.  TRANSESOPHAGEAL ECHOCARDIOGRAM performed on 03/02/2020 Left ventricular ejection fraction, by estimation, is 55 to 60%. The left ventricle has normal function. The left ventricle has no regional wall motion abnormalities. Left ventricular diastolic parameters were normal.  Right  ventricular systolic function is normal. The right ventricular size is normal.  No left atrial/left atrial appendage thrombus was detected.  The mitral valve is abnormal. Trivial mitral valve regurgitation. There is mild holosystolic prolapse of the medial segment of the anterior leaflet of the mitral valve.  The aortic valve is tricuspid. Aortic valve regurgitation is not visualized.   TRANSTHORACIC ECHOCARDIOGRAM performed on 03/01/2020 Left ventricular ejection fraction, by estimation, is 60 to 65%. The left ventricle has normal function. The left ventricle has no regional wall motion abnormalities. Left ventricular diastolic parameters are consistent with Grade I diastolic dysfunction (impaired relaxation).  Right ventricular systolic function is normal. The right ventricular size is normal. Tricuspid regurgitation signal is inadequate for assessing PA pressure.  Agitated saline contrast bubble study was negative, with no evidence of any interatrial shunt.   MR BRAIN WO CONTRAST performed on 02/29/2020 Positive for scattered acute infarcts in the Right MCA territory with cytotoxic edema and occasional petechial hemorrhage, but no malignant hemorrhagic transformation or mass effect. However, contralateral posterior Left MCA territory scattered chronic encephalomalacia is noted, also with occasional chronic micro-hemorrhage. Therefore this constellation of MRI and CTA findings is suspicious for recurrent ischemia secondary to a mechanism such as PFO or similar risk-factor.  Impression and Plan:  Crystal Benitez has been referred for pre-anesthesia review and clearance prior to her undergoing the planned anesthetic and procedural courses. Available labs, pertinent testing, and imaging results were personally reviewed by me in preparation for upcoming operative/procedural course. [facility] medical record has been updated following extensive record review and patient interview with PAT staff.    This patient has been appropriately cleared by cardiology (MILD to MODERATE) and neurology (MODERATE) with the individually indicated risk of significant perioperative complications. Based on clinical review performed today (10/14/22), barring any significant acute changes in the patient's overall condition, it is anticipated that she will be able to proceed with the planned surgical intervention. Any acute changes in clinical condition may necessitate her procedure being postponed and/or cancelled. Patient will meet with anesthesia team (MD and/or CRNA) on the day of her procedure for preoperative evaluation/assessment. Questions regarding anesthetic course will be fielded at that time.   Pre-surgical  instructions were reviewed with the patient during her PAT appointment, and questions were fielded to satisfaction by PAT clinical staff. She has been instructed on which medications that she will need to hold prior to surgery, as well as the ones that have been deemed safe/appropriate to take on the day of her procedure. As part of the general education provided by PAT, patient made aware both verbally and in writing, that she would need to abstain from the use of any illegal substances during her perioperative course.  She was advised that failure to follow the provided instructions could necessitate case cancellation or result in serious perioperative complications up to and including death. Patient encouraged to contact PAT and/or her surgeon's office to discuss any questions or concerns that may arise prior to surgery; verbalized understanding.   Quentin Mulling, MSN, APRN, FNP-C, CEN [facility]  Peri-operative Services Nurse Practitioner Phone: [phone] Fax: [phone] 10/14/22 9:42 AM  NOTE: This note has been prepared using Dragon dictation software. Despite my best ability to proofread, there is always the potential that unintentional transcriptional errors may still  occur from this process.

## 2022-10-14 ENCOUNTER — Encounter: Payer: Self-pay | Admitting: Obstetrics and Gynecology

## 2022-10-14 MED ORDER — PROPOFOL 1000 MG/100ML IV EMUL
INTRAVENOUS | Status: AC
  Filled 2022-10-14: qty 100

## 2022-10-17 ENCOUNTER — Encounter: Admission: RE | Payer: Self-pay | Source: Home / Self Care

## 2022-10-17 ENCOUNTER — Ambulatory Visit
Admission: RE | Admit: 2022-10-17 | Payer: Medicare HMO | Source: Home / Self Care | Admitting: Obstetrics and Gynecology

## 2022-10-17 HISTORY — DX: Diverticulosis of intestine, part unspecified, without perforation or abscess without bleeding: K57.90

## 2022-10-17 HISTORY — DX: Repeated falls: R29.6

## 2022-10-17 HISTORY — DX: Long term (current) use of antithrombotics/antiplatelets: Z79.02

## 2022-10-17 HISTORY — DX: Migraine, unspecified, not intractable, without status migrainosus: G43.909

## 2022-10-17 HISTORY — DX: Other forms of dyspnea: R06.09

## 2022-10-17 HISTORY — DX: Abnormal uterine and vaginal bleeding, unspecified: N93.9

## 2022-10-17 HISTORY — DX: Anxiety disorder, unspecified: F41.9

## 2022-10-17 HISTORY — DX: Long term (current) use of anticoagulants: Z79.01

## 2022-10-17 SURGERY — HYSTERECTOMY, VAGINAL, LAPAROSCOPY-ASSISTED, WITH SALPINGECTOMY
Anesthesia: General

## 2022-10-17 MED ORDER — PROPOFOL 1000 MG/100ML IV EMUL
INTRAVENOUS | Status: AC
  Filled 2022-10-17: qty 400

## 2022-10-17 MED ORDER — CELECOXIB 200 MG PO CAPS: 400.0000 mg | ORAL_CAPSULE | ORAL | Status: DC

## 2022-10-17 MED ORDER — ACETAMINOPHEN 500 MG PO TABS: 1000.0000 mg | ORAL_TABLET | ORAL | Status: DC

## 2022-10-17 MED ORDER — GABAPENTIN 300 MG PO CAPS: 300.0000 mg | ORAL_CAPSULE | ORAL | Status: DC

## 2022-10-17 MED ORDER — CEFAZOLIN SODIUM-DEXTROSE 2-4 GM/100ML-% IV SOLN: 2.0000 g | INTRAVENOUS | Status: DC

## 2022-10-17 NOTE — Anesthesia Preprocedure Evaluation (Deleted)
Anesthesia Evaluation    History of Anesthesia Complications (+) PONV and history of anesthetic complications  Airway        Dental   Pulmonary Current Smoker and Patient abstained from smoking.          Cardiovascular hypertension, + dysrhythmias (a fib on Plavix) + Valvular Problems/Murmurs MVP   ECG 07/19/22: NSR, incomplete RBBB; no STEMI  TEE 03/02/20:  1. Left ventricular ejection fraction, by estimation, is 55 to 60%. The left ventricle has normal function. The left ventricle has no regional wall motion abnormalities. Left ventricular diastolic parameters were normal.  2. Right ventricular systolic function is normal. The right ventricular size is normal.  3. No left atrial/left atrial appendage thrombus was detected.  4. The mitral valve is abnormal. Trivial mitral valve regurgitation. There is mild holosystolic prolapse of the medial segment of the anterior leaflet of the mitral valve.  5. The aortic valve is tricuspid. Aortic valve regurgitation is not visualized.    Neuro/Psych  Headaches PSYCHIATRIC DISORDERS Anxiety Depression    CVA (2021)    GI/Hepatic   Endo/Other    Renal/GU      Musculoskeletal   Abdominal   Peds  Hematology  (+) Blood dyscrasia, anemia   Anesthesia Other Findings Reviewed and agree with Edd Fabian pre-anesthesia clinical review note.   Per neurology Marlene Bast, PA-C), "patient is compliant with her treatment.  It is my medical opinion that she may proceed with any necessary procedures.  I recommend her antiplatelet therapy be held for the shortest amount of time possible.  She is at overall MODERATE stroke risk, however I do believe that the benefit of this procedure does outweigh the risk".    Per cardiology [name], MD), "this patient is optimized for surgery and may proceed with the planned procedural course with a MILD to MODERATE risk of significant perioperative cardiovascular  complications".   Cardiology note 05/25/22:  42 y.o. female with  1. Paroxysmal atrial fibrillation (CMS-HCC)  2. History of CVA (cerebrovascular accident)  3. Mixed hyperlipidemia  4. Tobacco use  5. H/O fall   Plan   History of CVA, re-start aspirin, management per neurology. Paroxysmal atrial fibrillation, controlled, rate controlled. The patient has a CHA2DS2-VASc score of 2. Continue rate control with metoprolol. Continue anticoagulation with Eliquis. Continue antiplatelet therapy with Plavix. Hyperlipidemia chronic, reasonably controlled, continue atorvastatin for lipid management Tobacco use, recommend tobacco cessation. Recurrent falls, unclear etiology, likely a combination of neurological and musculoskeletal, it does not appear to be due to dizziness, as the dizziness is likely a side effect of her current medications and previous CVA.  Have patient follow up in 6 months  Return in about 6 months (around 11/25/2022).    Reproductive/Obstetrics                             Anesthesia Physical Anesthesia Plan  ASA: 3  Anesthesia Plan: General   Post-op Pain Management:    Induction: Intravenous  PONV Risk Score and Plan: 3 and Ondansetron, Dexamethasone and Treatment may vary due to age or medical condition  Airway Management Planned: Oral ETT  Additional Equipment:   Intra-op Plan:   Post-operative Plan: Extubation in OR  Informed Consent:   Plan Discussed with:   Anesthesia Plan Comments: (Patient was no-show on DOS 10/17/22.)        Anesthesia Quick Evaluation

## 2022-10-17 NOTE — Progress Notes (Signed)
Contacted patient's mother to verify patient's arrival time - Mother reported, "She had dropped patient off at the front of the hospital, but she saw patient walking in a different direction.  More than likely patient would not show for surgery".  RN informed Press photographer.

## 2022-10-20 ENCOUNTER — Telehealth: Payer: Self-pay | Admitting: Obstetrics and Gynecology

## 2022-10-20 NOTE — Telephone Encounter (Signed)
Patient called and would like to reschedule her missed surgery on 10/17/2022. Patient states she had an emergency with her daughter and had to go out of town. Please advise.

## 2022-10-21 ENCOUNTER — Telehealth: Payer: Self-pay

## 2022-10-21 MED ORDER — OXYCODONE-ACETAMINOPHEN 5-325 MG PO TABS: 1.0000 | ORAL_TABLET | Freq: Four times a day (QID) | ORAL | 0 refills | Status: DC | PRN

## 2022-10-21 NOTE — Telephone Encounter (Signed)
Please inform that I will send in a 1 time refill for her until her surgery date.   Dr. Valentino Saxon

## 2022-10-21 NOTE — Telephone Encounter (Signed)
She can be rescheduled for later in the month where there is availability on the schedule

## 2022-10-21 NOTE — Addendum Note (Signed)
Addended by: Fabian November on: 10/21/2022 03:29 PM   Modules accepted: Orders

## 2022-10-21 NOTE — Telephone Encounter (Signed)
Patients mother calling in to state that the patient would be scheduled for surgery on 11/07/22. The patient is having pain and bleeding and would like to know if there is anything she can be prescribed to manage her pain. Please advise.

## 2022-10-24 ENCOUNTER — Emergency Department
Admission: EM | Admit: 2022-10-24 | Discharge: 2022-10-25 | Discharge status: Against Medical Advice | Payer: Medicare HMO | Attending: Emergency Medicine | Admitting: Emergency Medicine

## 2022-10-24 ENCOUNTER — Other Ambulatory Visit: Payer: Self-pay

## 2022-10-24 ENCOUNTER — Encounter: Payer: Self-pay | Admitting: *Deleted

## 2022-10-24 DIAGNOSIS — N939 Abnormal uterine and vaginal bleeding, unspecified: Secondary | ICD-10-CM | POA: Insufficient documentation

## 2022-10-24 DIAGNOSIS — Z5321 Procedure and treatment not carried out due to patient leaving prior to being seen by health care provider: Secondary | ICD-10-CM | POA: Diagnosis not present

## 2022-10-24 DIAGNOSIS — R109 Unspecified abdominal pain: Secondary | ICD-10-CM | POA: Diagnosis not present

## 2022-10-24 LAB — CBC
HCT: 30.8 % — ABNORMAL LOW (ref 36.0–46.0)
Hemoglobin: 9.4 g/dL — ABNORMAL LOW (ref 12.0–15.0)
MCH: 23.4 pg — ABNORMAL LOW (ref 26.0–34.0)
MCHC: 30.5 g/dL (ref 30.0–36.0)
MCV: 76.8 fL — ABNORMAL LOW (ref 80.0–100.0)
Platelets: 340 10*3/uL (ref 150–400)
RBC: 4.01 MIL/uL (ref 3.87–5.11)
RDW: 17 % — ABNORMAL HIGH (ref 11.5–15.5)
WBC: 5.8 10*3/uL (ref 4.0–10.5)
nRBC: 0 % (ref 0.0–0.2)

## 2022-10-24 LAB — COMPREHENSIVE METABOLIC PANEL
ALT: 12 U/L (ref 0–44)
AST: 27 U/L (ref 15–41)
Albumin: 3.5 g/dL (ref 3.5–5.0)
Alkaline Phosphatase: 65 U/L (ref 38–126)
Anion gap: 9 (ref 5–15)
BUN: 14 mg/dL (ref 6–20)
CO2: 21 mmol/L — ABNORMAL LOW (ref 22–32)
Calcium: 9.6 mg/dL (ref 8.9–10.3)
Chloride: 108 mmol/L (ref 98–111)
Creatinine, Ser: 0.68 mg/dL (ref 0.44–1.00)
GFR, Estimated: >60 mL/min (ref >60)
Glucose, Bld: 107 mg/dL — ABNORMAL HIGH (ref 70–99)
Potassium: 4.1 mmol/L (ref 3.5–5.1)
Sodium: 138 mmol/L (ref 135–145)
Total Bilirubin: 1.1 mg/dL (ref 0.3–1.2)
Total Protein: 6.9 g/dL (ref 6.5–8.1)

## 2022-10-24 NOTE — ED Notes (Signed)
[employer] officer approached triage nurse desk asking if this pt had been seen. Pt called in lobby multiple times with no answer. Pt was not in the bathroom. Pt did not answer when called on her mobile and home phone number. Due to pt not being found after being called, pt is to be moved OTF until further notice.

## 2022-10-24 NOTE — ED Triage Notes (Signed)
Pt brought in via ems from pt's mother's house.  Pt reports vaginal bleeding and abd pain.  .  Pt is scheduled for a hysterectomy.  Pt tearful  iv in place.

## 2022-10-25 ENCOUNTER — Encounter: Payer: Medicare HMO | Admitting: Obstetrics and Gynecology

## 2022-11-02 ENCOUNTER — Encounter
Admission: RE | Admit: 2022-11-02 | Discharge: 2022-11-02 | Disposition: A | Discharge status: Home / Self Care | Payer: Medicare HMO | Source: Ambulatory Visit | Attending: Obstetrics and Gynecology | Admitting: Obstetrics and Gynecology

## 2022-11-02 DIAGNOSIS — Z01812 Encounter for preprocedural laboratory examination: Secondary | ICD-10-CM

## 2022-11-02 NOTE — Pre-Procedure Instructions (Signed)
Follow up call completed with patient due to surgery cancellation, her surgery is rescheduled for 11/07/22 with Dr. Valentino Saxon, confirmed that there is no medication changes, no change in hx, patient told what medications to take on the day of surgery, and stop dates given for Plavix and Eliquis, number given to call on the day before surgery to get her arrival time, she [facility].

## 2022-11-02 NOTE — Patient Instructions (Addendum)
Your procedure is scheduled on: 11/07/22 Monday Report to the Registration Desk on the 1st floor of [facility]. To find out your arrival time, please call [phone] between 1PM - 3PM on: 11/04/22 Friday If your arrival time is 6:00 am, do not arrive before that time as the [facility] entrance doors do not open until 6:00 am.  REMEMBER: Instructions that are not followed completely may result in serious medical risk, up to and including death; or upon the discretion of your surgeon and anesthesiologist your surgery may need to be rescheduled.  Do not eat food after midnight the night before surgery.  No gum chewing or hard candies.  You may however, drink CLEAR liquids up to 2 hours before you are scheduled to arrive for your surgery. Do not drink anything within 2 hours of your scheduled arrival time.  Clear liquids include: - water  - apple juice without pulp - gatorade (not RED colors) - black coffee or tea (Do NOT add milk or creamers to the coffee or tea) Do NOT drink anything that is not on this list.  In addition, your doctor has ordered for you to drink the provided:  Ensure Pre-Surgery Clear Carbohydrate Drink  Drinking this carbohydrate drink up to two hours before surgery helps to reduce insulin resistance and improve patient outcomes. Please complete drinking 2 hours before scheduled arrival time.  One week prior to surgery: Stop Anti-inflammatories (NSAIDS) such as Advil, Aleve, Ibuprofen, Motrin, Naproxen, Naprosyn and Aspirin based products such as Excedrin, Goody's Powder, BC Powder.  Stop ANY OVER THE COUNTER supplements until after surgery.  You may however, continue to take Tylenol if needed for pain up until the day of surgery.  Continue taking all prescribed medications with the exception of the following:  clopidogrel (PLAVIX) hold for 5 days before surgery. Last dose 11/02/22.  ELIQUIS hold 2 days before the surgery. Last dose Friday,  11/05/22.  TAKE ONLY THESE MEDICATIONS THE MORNING OF SURGERY WITH A SIP OF WATER:  ALPRAZolam Prudy Feeler) if needed for anxiety divalproex (DEPAKOTE) ferrous sulfate gabapentin (NEURONTIN) oxyCODONE-acetaminophen (PERCOCET/ROXICET) if needed for pain  No Alcohol for 24 hours before or after surgery.  No Smoking including e-cigarettes for 24 hours before surgery.  No chewable tobacco products for at least 6 hours before surgery.  No nicotine patches on the day of surgery.  Do not use any "recreational" drugs for at least a week (preferably 2 weeks) before your surgery.  Please be advised that the combination of cocaine and anesthesia may have negative outcomes, up to and including death. If you test positive for cocaine, your surgery will be cancelled.  On the morning of surgery brush your teeth with toothpaste and water, you may rinse your mouth with mouthwash if you wish. Do not swallow any toothpaste or mouthwash.  Use CHG Soap or wipes as directed on instruction sheet.  Do not wear jewelry, make-up, hairpins, clips or nail polish.  Do not wear lotions, powders, or perfumes.   Do not shave body hair from the neck down 48 hours before surgery.  Contact lenses, hearing aids and dentures may not be worn into surgery.  Do not bring valuables to the hospital. [facility] is not responsible for any missing/lost belongings or valuables.   Notify your doctor if there is any change in your medical condition (cold, fever, infection).  Wear comfortable clothing (specific to your surgery type) to the hospital.  After surgery, you can help prevent lung complications by  doing breathing exercises.  Take deep breaths and cough every 1-2 hours. Your doctor may order a device called an Incentive Spirometer to help you take deep breaths. When coughing or sneezing, hold a pillow firmly against your incision with both hands. This is called "splinting." Doing this helps protect your incision. It  also decreases belly discomfort.  If you are being admitted to the hospital overnight, leave your suitcase in the car. After surgery it may be brought to your room.  In case of increased patient census, it may be necessary for you, the patient, to continue your postoperative care in the Same Day Surgery department.  If you are being discharged the day of surgery, you will not be allowed to drive home. You will need a responsible individual to drive you home and stay with you for 24 hours after surgery.   If you are taking public transportation, you will need to have a responsible individual with you.  Please call the Pre-admissions Testing Dept. at [phone] if you have any questions about these instructions.  Surgery Visitation Policy:  Patients having surgery or a procedure may have two visitors.  Children under the age of 54 must have an adult with them who is not the patient.  Inpatient Visitation:    Visiting hours are 7 a.m. to 8 p.m. Up to four visitors are allowed at one time in a patient room. The visitors may rotate out with other people during the day.  One visitor age 49 or older may stay with the patient overnight and must be in the room by 8 p.m.    Preparing for Surgery with CHLORHEXIDINE GLUCONATE (CHG) Soap  Chlorhexidine Gluconate (CHG) Soap  o An antiseptic cleaner that kills germs and bonds with the skin to continue killing germs even after washing  o Used for showering the night before surgery and morning of surgery  Before surgery, you can play an important role by reducing the number of germs on your skin.  CHG (Chlorhexidine gluconate) soap is an antiseptic cleanser which kills germs and bonds with the skin to continue killing germs even after washing.  Please do not use if you have an allergy to CHG or antibacterial soaps. If your skin becomes reddened/irritated stop using the CHG.  1. Shower the NIGHT BEFORE SURGERY and the MORNING OF SURGERY with  CHG soap.  2. If you choose to wash your hair, wash your hair first as usual with your normal shampoo.  3. After shampooing, rinse your hair and body thoroughly to remove the shampoo.  4. Use CHG as you would any other liquid soap. You can apply CHG directly to the skin and wash gently with a scrungie or a clean washcloth.  5. Apply the CHG soap to your body only from the neck down. Do not use on open wounds or open sores. Avoid contact with your eyes, ears, mouth, and genitals (private parts). Wash face and genitals (private parts) with your normal soap.  6. Wash thoroughly, paying special attention to the area where your surgery will be performed.  7. Thoroughly rinse your body with warm water.  8. Do not shower/wash with your normal soap after using and rinsing off the CHG soap.  9. Pat yourself dry with a clean towel.  10. Wear clean pajamas to bed the night before surgery.  12. Place clean sheets on your bed the night of your first shower and do not sleep with pets.  13. Shower again with the  CHG soap on the day of surgery prior to arriving at the hospital.  14. Do not apply any deodorants/lotions/powders.  15. Please wear clean clothes to the hospital.

## 2022-11-03 ENCOUNTER — Inpatient Hospital Stay: Admission: RE | Admit: 2022-11-03 | Payer: Medicare HMO | Source: Ambulatory Visit

## 2022-11-04 ENCOUNTER — Encounter
Admission: RE | Admit: 2022-11-04 | Discharge: 2022-11-04 | Disposition: A | Discharge status: Home / Self Care | Payer: Medicare HMO | Source: Ambulatory Visit | Attending: Obstetrics and Gynecology | Admitting: Obstetrics and Gynecology

## 2022-11-04 DIAGNOSIS — I480 Paroxysmal atrial fibrillation: Secondary | ICD-10-CM | POA: Diagnosis not present

## 2022-11-04 DIAGNOSIS — Z7902 Long term (current) use of antithrombotics/antiplatelets: Secondary | ICD-10-CM | POA: Diagnosis not present

## 2022-11-04 DIAGNOSIS — F1911 Other psychoactive substance abuse, in remission: Secondary | ICD-10-CM | POA: Diagnosis not present

## 2022-11-04 DIAGNOSIS — R825 Elevated urine levels of drugs, medicaments and biological substances: Secondary | ICD-10-CM | POA: Diagnosis not present

## 2022-11-04 DIAGNOSIS — N939 Abnormal uterine and vaginal bleeding, unspecified: Secondary | ICD-10-CM | POA: Diagnosis not present

## 2022-11-04 DIAGNOSIS — Z79899 Other long term (current) drug therapy: Secondary | ICD-10-CM | POA: Diagnosis not present

## 2022-11-04 DIAGNOSIS — Z8673 Personal history of transient ischemic attack (TIA), and cerebral infarction without residual deficits: Secondary | ICD-10-CM | POA: Diagnosis not present

## 2022-11-04 DIAGNOSIS — F17210 Nicotine dependence, cigarettes, uncomplicated: Secondary | ICD-10-CM | POA: Diagnosis not present

## 2022-11-04 DIAGNOSIS — I10 Essential (primary) hypertension: Secondary | ICD-10-CM | POA: Diagnosis not present

## 2022-11-04 DIAGNOSIS — D509 Iron deficiency anemia, unspecified: Secondary | ICD-10-CM | POA: Diagnosis not present

## 2022-11-04 DIAGNOSIS — Z01812 Encounter for preprocedural laboratory examination: Secondary | ICD-10-CM | POA: Diagnosis not present

## 2022-11-04 DIAGNOSIS — Z01818 Encounter for other preprocedural examination: Secondary | ICD-10-CM | POA: Diagnosis not present

## 2022-11-04 DIAGNOSIS — D259 Leiomyoma of uterus, unspecified: Secondary | ICD-10-CM | POA: Diagnosis not present

## 2022-11-04 LAB — CBC
HCT: 26.5 % — ABNORMAL LOW (ref 36.0–46.0)
Hemoglobin: 8 g/dL — ABNORMAL LOW (ref 12.0–15.0)
MCH: 23.1 pg — ABNORMAL LOW (ref 26.0–34.0)
MCHC: 30.2 g/dL (ref 30.0–36.0)
MCV: 76.4 fL — ABNORMAL LOW (ref 80.0–100.0)
Platelets: 470 10*3/uL — ABNORMAL HIGH (ref 150–400)
RBC: 3.47 MIL/uL — ABNORMAL LOW (ref 3.87–5.11)
RDW: 17.1 % — ABNORMAL HIGH (ref 11.5–15.5)
WBC: 6.5 10*3/uL (ref 4.0–10.5)
nRBC: 0 % (ref 0.0–0.2)

## 2022-11-04 LAB — URINE DRUG SCREEN, QUALITATIVE (ARMC ONLY)
Amphetamines, Ur Screen: POSITIVE — AB
Barbiturates, Ur Screen: POSITIVE — AB
Benzodiazepine, Ur Scrn: POSITIVE — AB
Cannabinoid 50 Ng, Ur ~~LOC~~: NOT DETECTED
Cocaine Metabolite,Ur ~~LOC~~: NOT DETECTED
MDMA (Ecstasy)Ur Screen: NOT DETECTED
Methadone Scn, Ur: NOT DETECTED
Opiate, Ur Screen: NOT DETECTED
Phencyclidine (PCP) Ur S: NOT DETECTED
Tricyclic, Ur Screen: NOT DETECTED

## 2022-11-05 ENCOUNTER — Encounter: Payer: Self-pay | Admitting: Urgent Care

## 2022-11-07 ENCOUNTER — Encounter: Payer: Self-pay | Admitting: Urgent Care

## 2022-11-07 ENCOUNTER — Observation Stay
Admission: RE | Admit: 2022-11-07 | Discharge: 2022-11-08 | Disposition: A | Discharge status: Home / Self Care | Payer: Medicare HMO | Attending: Obstetrics and Gynecology | Admitting: Obstetrics and Gynecology

## 2022-11-07 ENCOUNTER — Ambulatory Visit: Payer: Medicare HMO | Admitting: Urgent Care

## 2022-11-07 ENCOUNTER — Other Ambulatory Visit: Payer: Self-pay

## 2022-11-07 ENCOUNTER — Encounter
Admission: RE | Disposition: A | Discharge status: Home / Self Care | Payer: Self-pay | Source: Home / Self Care | Attending: Obstetrics and Gynecology

## 2022-11-07 DIAGNOSIS — Z7902 Long term (current) use of antithrombotics/antiplatelets: Secondary | ICD-10-CM | POA: Diagnosis not present

## 2022-11-07 DIAGNOSIS — I480 Paroxysmal atrial fibrillation: Secondary | ICD-10-CM | POA: Insufficient documentation

## 2022-11-07 DIAGNOSIS — R109 Unspecified abdominal pain: Secondary | ICD-10-CM | POA: Diagnosis not present

## 2022-11-07 DIAGNOSIS — Z90710 Acquired absence of both cervix and uterus: Principal | ICD-10-CM | POA: Diagnosis present

## 2022-11-07 DIAGNOSIS — Z79899 Other long term (current) drug therapy: Secondary | ICD-10-CM | POA: Insufficient documentation

## 2022-11-07 DIAGNOSIS — N939 Abnormal uterine and vaginal bleeding, unspecified: Secondary | ICD-10-CM | POA: Diagnosis not present

## 2022-11-07 DIAGNOSIS — D259 Leiomyoma of uterus, unspecified: Principal | ICD-10-CM

## 2022-11-07 DIAGNOSIS — F17200 Nicotine dependence, unspecified, uncomplicated: Secondary | ICD-10-CM | POA: Diagnosis not present

## 2022-11-07 DIAGNOSIS — D509 Iron deficiency anemia, unspecified: Secondary | ICD-10-CM | POA: Diagnosis not present

## 2022-11-07 DIAGNOSIS — F17210 Nicotine dependence, cigarettes, uncomplicated: Secondary | ICD-10-CM | POA: Diagnosis not present

## 2022-11-07 DIAGNOSIS — I10 Essential (primary) hypertension: Secondary | ICD-10-CM | POA: Diagnosis not present

## 2022-11-07 DIAGNOSIS — D649 Anemia, unspecified: Secondary | ICD-10-CM | POA: Diagnosis not present

## 2022-11-07 DIAGNOSIS — Z8673 Personal history of transient ischemic attack (TIA), and cerebral infarction without residual deficits: Secondary | ICD-10-CM | POA: Diagnosis not present

## 2022-11-07 DIAGNOSIS — Z539 Procedure and treatment not carried out, unspecified reason: Secondary | ICD-10-CM | POA: Diagnosis not present

## 2022-11-07 DIAGNOSIS — D508 Other iron deficiency anemias: Secondary | ICD-10-CM

## 2022-11-07 DIAGNOSIS — Z01812 Encounter for preprocedural laboratory examination: Secondary | ICD-10-CM

## 2022-11-07 DIAGNOSIS — Z01818 Encounter for other preprocedural examination: Secondary | ICD-10-CM

## 2022-11-07 HISTORY — PX: VAGINAL HYSTERECTOMY: SHX2639

## 2022-11-07 HISTORY — PX: MYOMECTOMY: SHX85

## 2022-11-07 LAB — PREPARE RBC (CROSSMATCH)

## 2022-11-07 LAB — HEMOGLOBIN AND HEMATOCRIT, BLOOD
HCT: 27.9 % — ABNORMAL LOW (ref 36.0–46.0)
Hemoglobin: 8.6 g/dL — ABNORMAL LOW (ref 12.0–15.0)

## 2022-11-07 SURGERY — MYOMECTOMY, UTERUS, VAGINAL APPROACH
Anesthesia: General | Site: Abdomen

## 2022-11-07 MED ORDER — TRANEXAMIC ACID-NACL 1000-0.7 MG/100ML-% IV SOLN
1000.0000 mg | Freq: Once | INTRAVENOUS | Status: AC
  Administered 2022-11-07: 1000 mg via INTRAVENOUS

## 2022-11-07 MED ORDER — ACETAMINOPHEN 500 MG PO TABS
1000.0000 mg | ORAL_TABLET | Freq: Four times a day (QID) | ORAL | Status: DC
  Filled 2022-11-07 (×2): qty 2

## 2022-11-07 MED ORDER — OXYCODONE-ACETAMINOPHEN 5-325 MG PO TABS
1.0000 | ORAL_TABLET | Freq: Four times a day (QID) | ORAL | Status: DC | PRN
  Filled 2022-11-07: qty 1

## 2022-11-07 MED ORDER — SODIUM CHLORIDE 0.9 % IV SOLN: 10.0000 mL/h | Freq: Once | INTRAVENOUS | Status: AC

## 2022-11-07 MED ORDER — ONDANSETRON HCL 4 MG/2ML IJ SOLN
INTRAMUSCULAR | Status: AC
  Filled 2022-11-07: qty 2

## 2022-11-07 MED ORDER — FAMOTIDINE 20 MG PO TABS
20.0000 mg | ORAL_TABLET | Freq: Once | ORAL | Status: AC
  Administered 2022-11-07: 20 mg via ORAL

## 2022-11-07 MED ORDER — DEXAMETHASONE SODIUM PHOSPHATE 10 MG/ML IJ SOLN
INTRAMUSCULAR | Status: AC
  Filled 2022-11-07: qty 1

## 2022-11-07 MED ORDER — ROCURONIUM BROMIDE 100 MG/10ML IV SOLN
INTRAVENOUS | Status: DC | PRN
  Administered 2022-11-07: 20 mg via INTRAVENOUS
  Administered 2022-11-07: 60 mg via INTRAVENOUS

## 2022-11-07 MED ORDER — FENTANYL CITRATE (PF) 100 MCG/2ML IJ SOLN
INTRAMUSCULAR | Status: DC | PRN
  Administered 2022-11-07 (×3): 50 ug via INTRAVENOUS

## 2022-11-07 MED ORDER — PROPOFOL 10 MG/ML IV BOLUS
INTRAVENOUS | Status: AC
  Filled 2022-11-07: qty 20

## 2022-11-07 MED ORDER — ACETAMINOPHEN 10 MG/ML IV SOLN
INTRAVENOUS | Status: AC
  Filled 2022-11-07: qty 100

## 2022-11-07 MED ORDER — ESTROGENS CONJUGATED 0.625 MG/GM VA CREA
TOPICAL_CREAM | VAGINAL | Status: AC
  Filled 2022-11-07: qty 30

## 2022-11-07 MED ORDER — VASOPRESSIN 20 UNIT/ML IV SOLN
INTRAVENOUS | Status: AC
  Filled 2022-11-07: qty 1

## 2022-11-07 MED ORDER — ACETAMINOPHEN 10 MG/ML IV SOLN
INTRAVENOUS | Status: DC | PRN
  Administered 2022-11-07: 1000 mg via INTRAVENOUS

## 2022-11-07 MED ORDER — PROPOFOL 500 MG/50ML IV EMUL
INTRAVENOUS | Status: DC | PRN
Start: 2022-11-07 — End: 2022-11-07
  Administered 2022-11-07: 30 ug/kg/min via INTRAVENOUS

## 2022-11-07 MED ORDER — ONDANSETRON HCL 4 MG/2ML IJ SOLN
INTRAMUSCULAR | Status: DC | PRN
  Administered 2022-11-07 (×2): 4 mg via INTRAVENOUS

## 2022-11-07 MED ORDER — DEXAMETHASONE SODIUM PHOSPHATE 10 MG/ML IJ SOLN
INTRAMUSCULAR | Status: DC | PRN
  Administered 2022-11-07: 10 mg via INTRAVENOUS

## 2022-11-07 MED ORDER — PANTOPRAZOLE SODIUM 40 MG PO TBEC
40.0000 mg | DELAYED_RELEASE_TABLET | Freq: Every day | ORAL | Status: DC
  Administered 2022-11-08: 40 mg via ORAL
  Filled 2022-11-07 (×2): qty 1

## 2022-11-07 MED ORDER — ALUM & MAG HYDROXIDE-SIMETH 200-200-20 MG/5ML PO SUSP: 30.0000 mL | ORAL | Status: DC | PRN

## 2022-11-07 MED ORDER — MORPHINE SULFATE (PF) 2 MG/ML IV SOLN
1.0000 mg | INTRAVENOUS | Status: DC | PRN
  Administered 2022-11-07: 1 mg via INTRAVENOUS
  Filled 2022-11-07: qty 1

## 2022-11-07 MED ORDER — PHENYLEPHRINE HCL-NACL 20-0.9 MG/250ML-% IV SOLN
INTRAVENOUS | Status: DC | PRN
  Administered 2022-11-07: 30 ug/min via INTRAVENOUS

## 2022-11-07 MED ORDER — ALPRAZOLAM 0.5 MG PO TABS: 0.5000 mg | ORAL_TABLET | Freq: Two times a day (BID) | ORAL | Status: DC | PRN

## 2022-11-07 MED ORDER — FENTANYL CITRATE (PF) 100 MCG/2ML IJ SOLN
INTRAMUSCULAR | Status: AC
  Filled 2022-11-07: qty 2

## 2022-11-07 MED ORDER — 0.9 % SODIUM CHLORIDE (POUR BTL) OPTIME
TOPICAL | Status: DC | PRN
  Administered 2022-11-07: 1000 mL

## 2022-11-07 MED ORDER — BISACODYL 10 MG RE SUPP: 10.0000 mg | Freq: Every day | RECTAL | Status: DC | PRN

## 2022-11-07 MED ORDER — DOCUSATE SODIUM 100 MG PO CAPS
100.0000 mg | ORAL_CAPSULE | Freq: Two times a day (BID) | ORAL | Status: DC
  Administered 2022-11-07 – 2022-11-08 (×2): 100 mg via ORAL
  Filled 2022-11-07 (×2): qty 1

## 2022-11-07 MED ORDER — ENOXAPARIN SODIUM 40 MG/0.4ML IJ SOSY
PREFILLED_SYRINGE | INTRAMUSCULAR | Status: AC
  Filled 2022-11-07: qty 0.4

## 2022-11-07 MED ORDER — PHENYLEPHRINE HCL-NACL 20-0.9 MG/250ML-% IV SOLN
INTRAVENOUS | Status: AC
  Filled 2022-11-07: qty 250

## 2022-11-07 MED ORDER — DEXMEDETOMIDINE HCL IN NACL 80 MCG/20ML IV SOLN
INTRAVENOUS | Status: DC | PRN
  Administered 2022-11-07: 8 ug via INTRAVENOUS
  Administered 2022-11-07: 12 ug via INTRAVENOUS

## 2022-11-07 MED ORDER — ZOLPIDEM TARTRATE 5 MG PO TABS: 5.0000 mg | ORAL_TABLET | Freq: Every evening | ORAL | Status: DC | PRN

## 2022-11-07 MED ORDER — CEFAZOLIN SODIUM-DEXTROSE 2-4 GM/100ML-% IV SOLN
INTRAVENOUS | Status: AC
  Filled 2022-11-07: qty 100

## 2022-11-07 MED ORDER — MIDAZOLAM HCL 2 MG/2ML IJ SOLN
INTRAMUSCULAR | Status: DC | PRN
  Administered 2022-11-07: 2 mg via INTRAVENOUS

## 2022-11-07 MED ORDER — SIMETHICONE 80 MG PO CHEW: 80.0000 mg | CHEWABLE_TABLET | Freq: Four times a day (QID) | ORAL | Status: DC | PRN

## 2022-11-07 MED ORDER — GABAPENTIN 300 MG PO CAPS
300.0000 mg | ORAL_CAPSULE | Freq: Two times a day (BID) | ORAL | Status: DC
  Administered 2022-11-07 – 2022-11-08 (×2): 300 mg via ORAL
  Filled 2022-11-07 (×2): qty 1

## 2022-11-07 MED ORDER — MAGNESIUM CITRATE PO SOLN: 1.0000 | Freq: Once | ORAL | Status: DC | PRN

## 2022-11-07 MED ORDER — PROPOFOL 1000 MG/100ML IV EMUL
INTRAVENOUS | Status: AC
  Filled 2022-11-07: qty 100

## 2022-11-07 MED ORDER — MIDAZOLAM HCL 2 MG/2ML IJ SOLN
INTRAMUSCULAR | Status: AC
  Filled 2022-11-07: qty 2

## 2022-11-07 MED ORDER — CLOPIDOGREL BISULFATE 75 MG PO TABS: 75.0000 mg | ORAL_TABLET | Freq: Every day | ORAL | Status: DC

## 2022-11-07 MED ORDER — CHLORHEXIDINE GLUCONATE 0.12 % MT SOLN
OROMUCOSAL | Status: AC
  Filled 2022-11-07: qty 15

## 2022-11-07 MED ORDER — LACTATED RINGERS IV SOLN: INTRAVENOUS | Status: DC

## 2022-11-07 MED ORDER — CEFAZOLIN SODIUM-DEXTROSE 2-4 GM/100ML-% IV SOLN
2.0000 g | Freq: Once | INTRAVENOUS | Status: AC
  Administered 2022-11-07: 2 g via INTRAVENOUS

## 2022-11-07 MED ORDER — BUPIVACAINE HCL (PF) 0.5 % IJ SOLN
INTRAMUSCULAR | Status: AC
  Filled 2022-11-07: qty 60

## 2022-11-07 MED ORDER — PROMETHAZINE HCL 25 MG/ML IJ SOLN: 6.2500 mg | INTRAMUSCULAR | Status: DC | PRN

## 2022-11-07 MED ORDER — APIXABAN 5 MG PO TABS
5.0000 mg | ORAL_TABLET | Freq: Two times a day (BID) | ORAL | Status: DC
  Administered 2022-11-08: 5 mg via ORAL
  Filled 2022-11-07: qty 1

## 2022-11-07 MED ORDER — PHENYLEPHRINE HCL (PRESSORS) 10 MG/ML IV SOLN
INTRAVENOUS | Status: DC | PRN
  Administered 2022-11-07: 80 ug via INTRAVENOUS

## 2022-11-07 MED ORDER — FERROUS SULFATE 325 (65 FE) MG PO TABS
325.0000 mg | ORAL_TABLET | Freq: Two times a day (BID) | ORAL | Status: DC
  Administered 2022-11-08: 325 mg via ORAL
  Filled 2022-11-07: qty 1

## 2022-11-07 MED ORDER — DROPERIDOL 2.5 MG/ML IJ SOLN
0.6250 mg | Freq: Once | INTRAMUSCULAR | Status: AC | PRN
  Administered 2022-11-07: 0.625 mg via INTRAVENOUS

## 2022-11-07 MED ORDER — ENOXAPARIN SODIUM 40 MG/0.4ML IJ SOSY
40.0000 mg | PREFILLED_SYRINGE | INTRAMUSCULAR | Status: AC
  Administered 2022-11-07: 40 mg via SUBCUTANEOUS

## 2022-11-07 MED ORDER — PHENYLEPHRINE 80 MCG/ML (10ML) SYRINGE FOR IV PUSH (FOR BLOOD PRESSURE SUPPORT)
PREFILLED_SYRINGE | INTRAVENOUS | Status: AC
  Filled 2022-11-07: qty 10

## 2022-11-07 MED ORDER — SERTRALINE HCL 100 MG PO TABS
100.0000 mg | ORAL_TABLET | Freq: Every day | ORAL | Status: DC
  Administered 2022-11-07: 100 mg via ORAL
  Filled 2022-11-07: qty 1

## 2022-11-07 MED ORDER — SENNOSIDES-DOCUSATE SODIUM 8.6-50 MG PO TABS: 1.0000 | ORAL_TABLET | Freq: Every evening | ORAL | Status: DC | PRN

## 2022-11-07 MED ORDER — SODIUM CHLORIDE (PF) 0.9 % IJ SOLN
INTRAMUSCULAR | Status: AC
  Filled 2022-11-07: qty 50

## 2022-11-07 MED ORDER — OXYCODONE HCL 5 MG/5ML PO SOLN: 5.0000 mg | Freq: Once | ORAL | Status: AC | PRN

## 2022-11-07 MED ORDER — ATROPINE SULFATE 1 MG/10ML IJ SOSY
PREFILLED_SYRINGE | INTRAMUSCULAR | Status: AC
  Filled 2022-11-07: qty 10

## 2022-11-07 MED ORDER — DROPERIDOL 2.5 MG/ML IJ SOLN
INTRAMUSCULAR | Status: AC
  Filled 2022-11-07: qty 2

## 2022-11-07 MED ORDER — DIVALPROEX SODIUM 125 MG PO DR TAB
125.0000 mg | DELAYED_RELEASE_TABLET | Freq: Two times a day (BID) | ORAL | Status: DC
  Administered 2022-11-07 – 2022-11-08 (×2): 125 mg via ORAL
  Filled 2022-11-07 (×2): qty 1

## 2022-11-07 MED ORDER — CHLORHEXIDINE GLUCONATE 0.12 % MT SOLN
15.0000 mL | Freq: Once | OROMUCOSAL | Status: AC
  Administered 2022-11-07: 15 mL via OROMUCOSAL

## 2022-11-07 MED ORDER — ORAL CARE MOUTH RINSE: 15.0000 mL | Freq: Once | OROMUCOSAL | Status: AC

## 2022-11-07 MED ORDER — OXYCODONE HCL 5 MG PO TABS
5.0000 mg | ORAL_TABLET | Freq: Four times a day (QID) | ORAL | Status: DC | PRN
  Administered 2022-11-07 – 2022-11-08 (×2): 10 mg via ORAL
  Filled 2022-11-07 (×2): qty 2

## 2022-11-07 MED ORDER — MENTHOL 3 MG MT LOZG: 1.0000 | LOZENGE | OROMUCOSAL | Status: DC | PRN

## 2022-11-07 MED ORDER — FAMOTIDINE 20 MG PO TABS
ORAL_TABLET | ORAL | Status: AC
  Filled 2022-11-07: qty 1

## 2022-11-07 MED ORDER — PROPOFOL 10 MG/ML IV BOLUS
INTRAVENOUS | Status: DC | PRN
  Administered 2022-11-07: 120 mg via INTRAVENOUS

## 2022-11-07 MED ORDER — OXYCODONE HCL 5 MG PO TABS
5.0000 mg | ORAL_TABLET | Freq: Once | ORAL | Status: AC | PRN
  Administered 2022-11-07: 5 mg via ORAL

## 2022-11-07 MED ORDER — LIDOCAINE HCL (PF) 2 % IJ SOLN
INTRAMUSCULAR | Status: AC
  Filled 2022-11-07: qty 5

## 2022-11-07 MED ORDER — CHLORHEXIDINE GLUCONATE CLOTH 2 % EX PADS: 6.0000 | MEDICATED_PAD | Freq: Every day | CUTANEOUS | Status: DC

## 2022-11-07 MED ORDER — ONDANSETRON HCL 4 MG PO TABS: 4.0000 mg | ORAL_TABLET | Freq: Four times a day (QID) | ORAL | Status: DC | PRN

## 2022-11-07 MED ORDER — METOPROLOL SUCCINATE ER 25 MG PO TB24
25.0000 mg | ORAL_TABLET | Freq: Every day | ORAL | Status: DC
  Administered 2022-11-07: 25 mg via ORAL
  Filled 2022-11-07: qty 1

## 2022-11-07 MED ORDER — ONDANSETRON HCL 4 MG/2ML IJ SOLN: 4.0000 mg | Freq: Four times a day (QID) | INTRAMUSCULAR | Status: DC | PRN

## 2022-11-07 MED ORDER — OXYCODONE HCL 5 MG PO TABS
ORAL_TABLET | ORAL | Status: AC
  Filled 2022-11-07: qty 1

## 2022-11-07 MED ORDER — POVIDONE-IODINE 10 % EX SWAB
2.0000 | Freq: Once | CUTANEOUS | Status: AC
  Administered 2022-11-07: 2 via TOPICAL

## 2022-11-07 MED ORDER — ACETAMINOPHEN 10 MG/ML IV SOLN: 1000.0000 mg | Freq: Once | INTRAVENOUS | Status: DC | PRN

## 2022-11-07 MED ORDER — ROCURONIUM BROMIDE 10 MG/ML (PF) SYRINGE
PREFILLED_SYRINGE | INTRAVENOUS | Status: AC
  Filled 2022-11-07: qty 10

## 2022-11-07 MED ORDER — TRANEXAMIC ACID-NACL 1000-0.7 MG/100ML-% IV SOLN
INTRAVENOUS | Status: AC
  Filled 2022-11-07: qty 100

## 2022-11-07 MED ORDER — SODIUM CHLORIDE 0.9 % IV SOLN
INTRAVENOUS | Status: AC | PRN
  Administered 2022-11-07: 20 mL via INTRAMUSCULAR

## 2022-11-07 MED ORDER — LIDOCAINE HCL (CARDIAC) PF 100 MG/5ML IV SOSY
PREFILLED_SYRINGE | INTRAVENOUS | Status: DC | PRN
  Administered 2022-11-07: 60 mg via INTRAVENOUS

## 2022-11-07 MED ORDER — TRAMADOL HCL 50 MG PO TABS
50.0000 mg | ORAL_TABLET | Freq: Four times a day (QID) | ORAL | Status: DC | PRN
  Administered 2022-11-08: 50 mg via ORAL
  Filled 2022-11-07: qty 2

## 2022-11-07 MED ORDER — FENTANYL CITRATE (PF) 100 MCG/2ML IJ SOLN
25.0000 ug | INTRAMUSCULAR | Status: DC | PRN
  Administered 2022-11-07 (×5): 25 ug via INTRAVENOUS

## 2022-11-07 MED ORDER — SUGAMMADEX SODIUM 200 MG/2ML IV SOLN
INTRAVENOUS | Status: DC | PRN
  Administered 2022-11-07: 200 mg via INTRAVENOUS

## 2022-11-07 SURGICAL SUPPLY — 78 items
ADH SKN CLS APL DERMABOND .7 (GAUZE/BANDAGES/DRESSINGS) ×2
APL PRP STRL LF DISP 70% ISPRP (MISCELLANEOUS) ×2
BAG COUNTER SPONGE SURGICOUNT (BAG) ×3 IMPLANT
BAG DRN RND TRDRP ANRFLXCHMBR (UROLOGICAL SUPPLIES) ×2
BAG SPNG CNTER NS LX DISP (BAG) ×2
BAG URINE DRAIN 2000ML AR STRL (UROLOGICAL SUPPLIES) ×3 IMPLANT
BLADE SURG 15 STRL LF DISP TIS (BLADE) IMPLANT
BLADE SURG 15 STRL SS (BLADE)
BLADE SURG SZ10 CARB STEEL (BLADE) IMPLANT
BLADE SURG SZ11 CARB STEEL (BLADE) ×3 IMPLANT
CATH FOLEY 2WAY 5CC 16FR (CATHETERS) ×2
CATH URTH 16FR FL 2W BLN LF (CATHETERS) ×3 IMPLANT
CHLORAPREP W/TINT 26 (MISCELLANEOUS) ×3 IMPLANT
DERMABOND ADVANCED .7 DNX12 (GAUZE/BANDAGES/DRESSINGS) ×3 IMPLANT
DRAPE PERI LITHO V/GYN (MISCELLANEOUS) ×3 IMPLANT
DRAPE SHEET LG 3/4 BI-LAMINATE (DRAPES) ×3 IMPLANT
ELECT CAUTERY BLADE 6.4 (BLADE) ×3 IMPLANT
ELECT REM PT RETURN 9FT ADLT (ELECTROSURGICAL) ×2
ELECTRODE REM PT RTRN 9FT ADLT (ELECTROSURGICAL) ×3 IMPLANT
GAUZE 4X4 16PLY ~~LOC~~+RFID DBL (SPONGE) ×3 IMPLANT
GAUZE PACK 2X3YD (PACKING) ×3 IMPLANT
GLOVE BIO SURGEON STRL SZ 6.5 (GLOVE) ×6 IMPLANT
GLOVE INDICATOR 7.0 STRL GRN (GLOVE) ×9 IMPLANT
GLOVE PI ORTHO PRO STRL 7.5 (GLOVE) ×6 IMPLANT
GOWN STRL REUS W/ TWL LRG LVL3 (GOWN DISPOSABLE) ×9 IMPLANT
GOWN STRL REUS W/ TWL XL LVL3 (GOWN DISPOSABLE) ×3 IMPLANT
GOWN STRL REUS W/TWL LRG LVL3 (GOWN DISPOSABLE) ×6
GOWN STRL REUS W/TWL XL LVL3 (GOWN DISPOSABLE) ×2
IRRIGATION STRYKERFLOW (MISCELLANEOUS) IMPLANT
IRRIGATOR STRYKERFLOW (MISCELLANEOUS)
IV LACTATED RINGERS 1000ML (IV SOLUTION) ×3 IMPLANT
KIT PINK PAD W/HEAD ARE REST (MISCELLANEOUS) ×2
KIT PINK PAD W/HEAD ARM REST (MISCELLANEOUS) ×3 IMPLANT
KIT TURNOVER CYSTO (KITS) ×3 IMPLANT
LABEL OR SOLS (LABEL) ×3 IMPLANT
LIGASURE LAP MARYLAND 5MM 37CM (ELECTROSURGICAL) ×3 IMPLANT
MANIFOLD NEPTUNE II (INSTRUMENTS) ×3 IMPLANT
MANIPULATOR VCARE LG CRV RETR (MISCELLANEOUS) IMPLANT
MANIPULATOR VCARE SML CRV RETR (MISCELLANEOUS) IMPLANT
MANIPULATOR VCARE STD CRV RETR (MISCELLANEOUS) IMPLANT
NDL HYPO 25GX1 SAFETY (NEEDLE) ×3 IMPLANT
NDL SPNL 22GX3.5 QUINCKE BK (NEEDLE) ×3 IMPLANT
NEEDLE HYPO 25GX1 SAFETY (NEEDLE) ×2 IMPLANT
NEEDLE SPNL 22GX3.5 QUINCKE BK (NEEDLE) ×2 IMPLANT
NS IRRIG 500ML POUR BTL (IV SOLUTION) ×3 IMPLANT
PACK BASIN MINOR ARMC (MISCELLANEOUS) ×3 IMPLANT
PACK GYN LAPAROSCOPIC (MISCELLANEOUS) ×3 IMPLANT
PAD OB MATERNITY 4.3X12.25 (PERSONAL CARE ITEMS) ×3 IMPLANT
PAD PREP OB/GYN DISP 24X41 (PERSONAL CARE ITEMS) ×3 IMPLANT
RETRACTOR PHONTONGUIDE ADAPT (ADAPTER) ×3 IMPLANT
RETRACTOR YANK SUCT EIGR SABER (INSTRUMENTS) IMPLANT
SCISSORS METZENBAUM CVD 33 (INSTRUMENTS) ×3 IMPLANT
SCRUB CHG 4% DYNA-HEX 4OZ (MISCELLANEOUS) ×3 IMPLANT
SHEARS HARMONIC ACE PLUS 36CM (ENDOMECHANICALS) IMPLANT
SLEEVE Z-THREAD 5X100MM (TROCAR) ×6 IMPLANT
SOL PREP PVP 2OZ (MISCELLANEOUS) ×2
SOLUTION PREP PVP 2OZ (MISCELLANEOUS) ×3 IMPLANT
SPONGE T-LAP 18X18 ~~LOC~~+RFID (SPONGE) IMPLANT
STRIP CLOSURE SKIN 1/2X4 (GAUZE/BANDAGES/DRESSINGS) ×3 IMPLANT
SUT CHROMIC 1-0 (SUTURE) ×3 IMPLANT
SUT CHROMIC 2 0 CT 1 (SUTURE) ×3 IMPLANT
SUT MNCRL 4-0 (SUTURE) ×2
SUT MNCRL 4-0 27XMFL (SUTURE) ×2
SUT VIC AB 0 CT1 27 (SUTURE) ×6
SUT VIC AB 0 CT1 27XCR 8 STRN (SUTURE) ×12 IMPLANT
SUT VIC AB 0 CT1 36 (SUTURE) ×3 IMPLANT
SUT VIC AB 0 CT2 27 (SUTURE) ×3 IMPLANT
SUT VIC AB 2-0 CT1 (SUTURE) ×3 IMPLANT
SUT VIC AB 4-0 FS2 27 (SUTURE) ×3 IMPLANT
SUTURE MNCRL 4-0 27XMF (SUTURE) ×3 IMPLANT
SYR 10ML LL (SYRINGE) ×3 IMPLANT
SYR 50ML LL SCALE MARK (SYRINGE) IMPLANT
SYR CONTROL 10ML LL (SYRINGE) ×3 IMPLANT
TAPE TRANSPORE STRL 2 31045 (GAUZE/BANDAGES/DRESSINGS) ×3 IMPLANT
TRAP FLUID SMOKE EVACUATOR (MISCELLANEOUS) ×3 IMPLANT
TROCAR Z-THREAD FIOS 5X100MM (TROCAR) ×3 IMPLANT
TUBING EVAC SMOKE HEATED PNEUM (TUBING) ×3 IMPLANT
WATER STERILE IRR 500ML POUR (IV SOLUTION) ×3 IMPLANT

## 2022-11-07 NOTE — Anesthesia Preprocedure Evaluation (Signed)
Anesthesia Evaluation  Patient identified by MRN, date of birth, ID band Patient awake    Reviewed: Allergy & Precautions, H&P , NPO status , Patient's Chart, lab work & pertinent test results, reviewed documented beta blocker date and time   History of Anesthesia Complications (+) PONV and history of anesthetic complications  Airway Mallampati: II  TM Distance: >3 FB Neck ROM: full    Dental  (+) Teeth Intact   Pulmonary neg pulmonary ROS, Current Smoker and Patient abstained from smoking.   Pulmonary exam normal        Cardiovascular Exercise Tolerance: Good hypertension, On Medications + DOE  Normal cardiovascular exam Rhythm:regular Rate:Normal     Neuro/Psych  Headaches PSYCHIATRIC DISORDERS Anxiety Depression    CVA, Residual Symptoms    GI/Hepatic negative GI ROS, Neg liver ROS,,,  Endo/Other  negative endocrine ROS    Renal/GU negative Renal ROS  negative genitourinary   Musculoskeletal   Abdominal   Peds  Hematology  (+) Blood dyscrasia, anemia   Anesthesia Other Findings Past Medical History: No date: Abnormal uterine bleeding 02/29/2020: Acute CVA (cerebrovascular accident) [facility])     Comment:  a.) MRI brain 02/29/2020: scattered acute infarcts in               the RIGHT MCA territory with cytotoxic edema and               occasional petechial hemorrhage; no malignant hemorrhagic              transformation or mass effect; has residual memory               deficits and numbness No date: Acute metabolic encephalopathy 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 No date: Anxiety     Comment:  a.) on BZO PRN (alprazolam) No date: Bilateral ovarian cysts 06/07/2022: Cervical mass     Comment:  a.) CT AP 06/07/2022: ovoid  6.4 x 3.9 x 3.6 cm uterine               cervix mass; b.) CT AP 06/22/2022: interval increase in               size to 7.2 x 6.4 cm sagittal  dimension No date: Cervical prolapse 03/01/2020: Diastolic dysfunction     Comment:  a.) TTE bubble study 03/01/2020: EF 60-65%, no RWMAs, no              IAS, G1DD; b.) TEE 03/02/2020: EF 55-60%, triv AR, mild               holosystolic prolapse of medial segment of anterior               leaflet of the MV No date: Diverticulosis No date: DOE (dyspnea on exertion) No date: HLD (hyperlipidemia) No date: Hypertension No date: Iron deficiency anemia No date: Long term (current) use of anticoagulants     Comment:  a.) apixaban No date: Long term current use of antithrombotics/antiplatelets     Comment:  a.) clopidogrel No date: Major depression No date: Migraines 03/02/2020: MVP (mitral valve prolapse)     Comment:  a.) TEE 03/02/2020: mild holosystolic prolapse of medial              segment of anterior leaflet of the MV No date: Nicotine dependence No date: PAF (paroxysmal atrial fibrillation) (HCC)     Comment:  a.) CHA2DS2VASc = 4 (sex, HTN, CVA x2);  b.)  rate/rhythm              maintained on oral metoprolol succinate; chronically               anticoagulated with apixaban No date: PONV (postoperative nausea and vomiting) No date: Reactive thrombocytosis No date: Recurrent falls     Comment:  a.) related to post-CVA weakness and vertiginous               symptoms Past Surgical History: No date: CHOLECYSTECTOMY 03/02/2020: TEE WITHOUT CARDIOVERSION; N/A     Comment:  Procedure: TRANSESOPHAGEAL ECHOCARDIOGRAM (TEE);                Surgeon: Dalia Heading, MD;  Location: [facility] ORS;                Service: Cardiovascular;  Laterality: N/A; No date: TUBAL LIGATION   Reproductive/Obstetrics negative OB ROS                             Anesthesia Physical Anesthesia Plan  ASA: 3  Anesthesia Plan: General ETT   Post-op Pain Management:    Induction: Intravenous  PONV Risk Score and Plan: 4 or greater  Airway Management Planned: Mask and Oral  ETT  Additional Equipment:   Intra-op Plan:   Post-operative Plan: Extubation in OR  Informed Consent: I have reviewed the patients History and Physical, chart, labs and discussed the procedure including the risks, benefits and alternatives for the proposed anesthesia with the patient or authorized representative who has indicated his/her understanding and acceptance.     Dental Advisory Given  Plan Discussed with: CRNA  Anesthesia Plan Comments:        Anesthesia Quick Evaluation

## 2022-11-07 NOTE — Transfer of Care (Signed)
Immediate Anesthesia Transfer of Care Note  Patient: Crystal Benitez  Procedure(s) Performed: VAGINAL MYOMECTOMY HYSTERECTOMY VAGINAL, Total and bilateral salpingectomy (Abdomen)  Patient Location: PACU  Anesthesia Type:General  Level of Consciousness: awake, alert , and oriented  Airway & Oxygen Therapy: Patient Spontanous Breathing  Post-op Assessment: Report given to RN and Post -op Vital signs reviewed and stable  Post vital signs: Reviewed and stable  Last Vitals:  Vitals Value Taken Time  BP 132/90 11/07/22 1245  Temp 36.1 C 11/07/22 1239  Pulse 68 11/07/22 1248  Resp 16 11/07/22 1248  SpO2 100 % 11/07/22 1248  Vitals shown include unfiled device data.  Last Pain:  Vitals:   11/07/22 0927  TempSrc: Temporal  PainSc: 7          Complications: No notable events documented.

## 2022-11-07 NOTE — Anesthesia Procedure Notes (Signed)
Procedure Name: Intubation Date/Time: 11/07/2022 10:14 AM  Performed by: Karoline Caldwell, CRNAPre-anesthesia Checklist: Patient identified, Patient being monitored, Timeout performed, Emergency Drugs available and Suction available Patient Re-evaluated:Patient Re-evaluated prior to induction Oxygen Delivery Method: Circle system utilized Preoxygenation: Pre-oxygenation with 100% oxygen Induction Type: IV induction Ventilation: Mask ventilation without difficulty Laryngoscope Size: 3 and McGraph Grade View: Grade I Tube type: Oral Tube size: 7.0 mm Number of attempts: 1 Airway Equipment and Method: Stylet Placement Confirmation: ETT inserted through vocal cords under direct vision, positive ETCO2 and breath sounds checked- equal and bilateral Secured at: 20 cm Tube secured with: Tape Dental Injury: Teeth and Oropharynx as per pre-operative assessment

## 2022-11-07 NOTE — Op Note (Signed)
Procedure(s): VAGINAL MYOMECTOMY HYSTERECTOMY VAGINAL, Total and bilateral salpingectomy Procedure Note  Crystal Benitez female 42 y.o. 11/07/2022  Indications: The patient is a 42 y.o. 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  Her past medical history is significant for h/o stroke, HTN, paroxsymal atrial fibrillation, use of long-term anticoagulant therapy. Patient also with a history of substance use.   Pre-operative Diagnosis:  Aborting myoma, abnormal uterine bleeding, iron deficiency anemia, and pelvic pain.  Post-operative Diagnosis: Same, with transfusion of 1 unit PRBCs intraoperatively due to bleeding  Surgeon: Hildred Laser, MD  Assistants:  Brennan Bailey, MD.   Anesthesia: General endotracheal anesthesia  Findings: There was an ~ 5 cm partially necrotic appearing mass in vaginal vault, with stalk descending from within the lower uterine segment or endocervical canal.  Uterus was sounded to 10 cm Fallopian tubes were previously surgically interrupted.  Ovaries appeared normal.  Procedure Details: The patient was seen in the Holding Room. The risks, benefits, complications, treatment options, and expected outcomes were discussed with the patient.  The patient concurred with the proposed plan, giving informed consent.  The site of surgery properly noted/marked. The patient was taken to the Operating Room, identified as Crystal Benitez and the procedure verified as Procedure(s) (LRB): VAGINAL MYOMECTOMY (N/A), HYSTERECTOMY LAPAROSCOPIC-ASSISTED VAGINAL (N/A), BILATERAL SALPINGECTOMY (BILATERAL). A Time Out was held and the above information confirmed.  She was then placed under general anesthesia without difficulty. She was placed in the dorsal lithotomy position, and was prepped and draped in a sterile manner.  A foley catheter was placed to gravity drainage. A sterile weighted speculum was inserted into the  vagina and the cervical mass was visualized and attempted to grasp with a double tooth tenaculum however was noted to be friable.  A single-tooth tenaculum was then also utilized to grasp the mass. The base of the mass was able to palpated until a stalk could be identified.  The stalk was then transected with the Bovie until the mass was completely separated from the surrounding tissue.  The mass was sent for frozen pathology due to it's appearance to rule out malignancy.  The cervix was grasped at the anterior lip using a single-toothed tenaculum. The uterus was sounded to 8.5 cm, and a Hulka clamp was placed for uterine manipulation.  Brisk bleeding was noted to be coming from inside the cervical canal likely at the level of the internal os.  Several attempts were made to identify the source of the bleed however this was difficult to due. Due to the briskness of the bleed, the decision was made to proceed vaginally with the hysterectomy and forego laparoscopic component. The Hulka clamp was removed from the uterine cavity.   The anterior and posterior lips of the cervix were grasped with a double-tooth tenaculum.  The cervix was then injected circumferentially with 20 ml of normal saline to create plane for dissection.  The cervix was then circumferentially incised, and the bladder was dissected off the pubocervical fascia anteriorly without complication.  The anterior cul-de-sac was then entered sharply without difficulty and a retractor was placed.  The same procedure was performed posteriorly and the posterior cul-de-sac was entered sharply without difficulty.   The anterior and posterior cuffs were reefed to their respective adjacent peritoneal layers.   A long weighted speculum was inserted into the posterior cul-de-sac.  The Heaney clamp was then used to clamp the uterosacral ligaments on either side.  They were then cut and  sutured ligated with 0 Vicryl, and the ligated uterosacral ligaments were  transfixed to the ipsilateral vaginal epithelium to further support the vagina and provide hemostasis.   Of note, all sutures used in this case were 0 Vicryl unless otherwise noted.     The cardinal ligaments were then clamped, cauterized and cut with the Ligasure Impact Device. The uterine vessels and broad ligaments were then serially clamped, cauterized and cut on both sides with the Ligasure. The uterus has to be cored to help with exposure and to be able to get to the uteroovarian ligaments.  The uterus was then delivered via the posterior cul-de-sac, and the cornua were clamped, cauterized and cut with the Ligasure.  The fallopian tubes including the fimbria were clamped, cauterized and cut with the Ligasure.  The cardinal ligaments were then clamped, cut and ligated. The uterine vessels and broad ligaments were then serially clamped with the Heaney clamps, cut, and suture ligated on both sides.  The uterus was then delivered via the posterior cul-de-sac, and the cornua were clamped with the Heaney clamps, transected, and the uterus was delivered and sent to pathology. These pedicles were then suture ligated to ensure hemostasis.  The fallopian tubes including the fimbria were clamped, cut and suture ligated.   All pedicles from the uterosacral ligament to the cornua were examined hemostasis was confirmed.  The left fimbria was then identified and grasped with a Babcock clamp, and clamped with Haeney clamp, cut, and suture ligated. A similar procedure was performed on the right. A survey of the vaginal cuff and pedicles were performed, and there was bleeding noted near the left uterosacral and cardinal ligaments.  Several figure-of-eight sutures were placed to achieve hemostasis.  The peritoneum was then closed using a purse-string suture of 0-Vicryl.  The vaginal cuff was then closed with a serial figure-of-eight sutures with care given to incorporate the uterosacral pedicles bilaterally.  All  instruments were then removed from the pelvis. Due to the bleeding encountered during her surgery (EBL 750 ml) and low starting Hgb (8.0), the decision was made for 1 unit PRBCs to be administered intraoperatively.   The patient otherwise tolerated the procedure well.  All instruments, needles, and sponge counts were correct times three.  The patient was extubated successfully, and was taken to the recovery room in stable condition.     An experienced assistant was required given the standard of surgical care given the complexity of the case.  This assistant was needed for exposure, dissection, suctioning, retraction, instrument exchange, and for overall help during the procedure.   Estimated Blood Loss:  750 ml      Drains: foley catheterization to gravity drainage with 600 ml of clear urine at end of the procedure.          Total IV Fluids:  2400 ml  Specimens: Uterus with cervix, bilateral fallopian tubes         Implants: None         Complications:  None; patient tolerated the procedure well.         Disposition: PACU - hemodynamically stable.         Condition: stable   Hildred Laser, MD [facility]

## 2022-11-07 NOTE — H&P (Signed)
GYNECOLOGY PREOPERATIVE HISTORY AND PHYSICAL   Subjective:  Crystal Benitez is a 42 y.o. [id number]  here for surgical management of abnormal uterine bleeding, fibroid uterus with large aborting fibroid in cervical canal, and pelvic pain.  Bleeding has been ongoing for the past 4 months consistently, however prior to this, her cycles have been irregular but heavy and prolonged when they do occur over the past 2 years. Significant preoperative concerns include h/o stroke, HTN, paroxsymal atrial fibrillation.  Patient has received clearance for surgery by both her Neurologist and Cardiologist (with recommendations to hold anti-platelet therapy for shortest time possible for surgery).   Of note, patient was scheduled for surgery on 10/17/2022 and no-showed.    Proposed surgery: LAVH with excision of cervical mass, bilateral salpingectomy    Pertinent Gynecological History: Menses:  see HPI Bleeding: dysfunctional uterine bleeding Contraception: tubal ligation Last mammogram:  Overdue   Last pap: normal Date: 07/06/2022   Past Medical History:  Diagnosis Date   Abnormal uterine bleeding    Acute CVA (cerebrovascular accident) (HCC) 02/29/2020   a.) MRI brain 02/29/2020: scattered acute infarcts in the RIGHT MCA territory with cytotoxic edema and occasional petechial hemorrhage; no malignant hemorrhagic transformation or mass effect; has residual memory deficits and numbness   Acute metabolic encephalopathy    Adjustment disorder with mixed disturbance of emotions and conduct    Anxiety    a.) on BZO PRN (alprazolam)   Bilateral ovarian cysts    Cervical mass 06/07/2022   a.) CT AP 06/07/2022: ovoid  6.4 x 3.9 x 3.6 cm uterine cervix mass; b.) CT AP 06/22/2022: interval increase in size to 7.2 x 6.4 cm sagittal dimension   Cervical prolapse    Diastolic dysfunction 03/01/2020   a.) TTE bubble study 03/01/2020: EF 60-65%, no RWMAs, no IAS, G1DD; b.) TEE 03/02/2020: EF 55-60%, triv AR, mild  holosystolic prolapse of medial segment of anterior leaflet of the MV   Diverticulosis    DOE (dyspnea on exertion)    HLD (hyperlipidemia)    Hypertension    Iron deficiency anemia    Long term (current) use of anticoagulants    a.) apixaban   Long term current use of antithrombotics/antiplatelets    a.) clopidogrel   Major depression    Migraines    MVP (mitral valve prolapse) 03/02/2020   a.) TEE 03/02/2020: mild holosystolic prolapse of medial segment of anterior leaflet of the MV   Nicotine dependence    PAF (paroxysmal atrial fibrillation) (HCC)    a.) CHA2DS2VASc = 4 (sex, HTN, CVA x2);  b.) rate/rhythm maintained on oral metoprolol succinate; chronically anticoagulated with apixaban   PONV (postoperative nausea and vomiting)    Reactive thrombocytosis    Recurrent falls    a.) related to post-CVA weakness and vertiginous symptoms    Past Surgical History:  Procedure Laterality Date   CHOLECYSTECTOMY     TEE WITHOUT CARDIOVERSION N/A 03/02/2020   Procedure: TRANSESOPHAGEAL ECHOCARDIOGRAM (TEE);  Surgeon: Dalia Heading, MD;  Location: [facility] ORS;  Service: Cardiovascular;  Laterality: N/A;   TUBAL LIGATION      OB History  Gravida Para Term Preterm AB Living  3 3 3     3   SAB IAB Ectopic Multiple Live Births               # Outcome Date GA Lbr Len/2nd Weight Sex Type Anes PTL Lv  3 Term      Vag-Spont     2 Term  Vag-Spont     1 Term      Vag-Spont       Family History  Problem Relation Age of Onset   Hypertension Mother    Hyperlipidemia Mother    Cancer Father    Pancreatic cancer Maternal Grandmother    Breast cancer Paternal Grandmother     Social History   Socioeconomic History   Marital status: Single    Spouse name: Not on file   Number of children: 3   Years of education: Not on file   Highest education level: Not on file  Occupational History   Not on file  Tobacco Use   Smoking status: Every Day    Current packs/day: 0.50    Types:  Cigarettes   Smokeless tobacco: Never  Vaping Use   Vaping status: Never Used  Substance and Sexual Activity   Alcohol use: Not Currently    Comment: occasional    Drug use: No   Sexual activity: Not Currently  Other Topics Concern   Not on file  Social History Narrative   Not on file   Social Determinants of Health   Financial Resource Strain: Not on file  Food Insecurity: Patient Declined (05/13/2022)   Hunger Vital Sign    Worried About Running Out of Food in the Last Year: Patient declined    Ran Out of Food in the Last Year: Patient declined  Transportation Needs: Patient Declined (05/13/2022)   PRAPARE - Administrator, Civil Service (Medical): Patient declined    Lack of Transportation (Non-Medical): Patient declined  Physical Activity: Not on file  Stress: Not on file  Social Connections: Not on file  Intimate Partner Violence: Patient Declined (05/13/2022)   Humiliation, Afraid, Rape, and Kick questionnaire    Fear of Current or Ex-Partner: Patient declined    Emotionally Abused: Patient declined    Physically Abused: Patient declined    Sexually Abused: Patient declined    No current facility-administered medications on file prior to encounter.   Current Outpatient Medications on File Prior to Encounter  Medication Sig Dispense Refill   ALPRAZolam (XANAX) 0.5 MG tablet Take 0.5 mg by mouth 2 (two) times daily as needed.     clopidogrel (PLAVIX) 75 MG tablet Take 75 mg by mouth at bedtime.     divalproex (DEPAKOTE) 125 MG DR tablet Take 1 tablet (125 mg total) by mouth every 12 (twelve) hours. 60 tablet 0   ELIQUIS 5 MG TABS tablet Take 5 mg by mouth 2 (two) times daily.     ferrous sulfate 325 (65 FE) MG tablet Take 1 tablet (325 mg total) by mouth daily with breakfast. 30 tablet 0   gabapentin (NEURONTIN) 300 MG capsule Take 300 mg twice daily     metoprolol succinate (TOPROL-XL) 25 MG 24 hr tablet Take 25 mg by mouth at bedtime.      oxyCODONE-acetaminophen (PERCOCET/ROXICET) 5-325 MG tablet Take 1-2 tablets by mouth every 6 (six) hours as needed for severe pain. 30 tablet 0   polyethylene glycol (MIRALAX) 17 g packet Take 17 g by mouth 2 (two) times daily. 14 each 0   sertraline (ZOLOFT) 100 MG tablet Take 100 mg by mouth at bedtime.     atorvastatin (LIPITOR) 40 MG tablet Take 40 mg by mouth at bedtime.     butalbital-acetaminophen-caffeine (FIORICET) 50-325-40 MG tablet Take 1 tablet at headache onset, can repeat after 4 hours. No more than 2 pills in 24 hours. Do not take  more than 2-3 times a week MAXIMUM.     No Known Allergies    Review of Systems Constitutional: No recent fever/chills/sweats Respiratory: No recent cough/bronchitis Cardiovascular: No chest pain Gastrointestinal: No recent nausea/vomiting/diarrhea Genitourinary: No UTI symptoms Hematologic/lymphatic:No history of coagulopathy or recent blood thinner use    Objective:   Blood pressure 106/71, pulse 84, temperature 98.8 F (37.1 C), temperature source Temporal, resp. rate 18, height 5\' 5"  (1.651 m), last menstrual period 10/12/2022, SpO2 100%.  Body mass index is 24.13 kg/m.  CONSTITUTIONAL: Well-developed, well-nourished female in no acute distress. Appears older than stated age.  HENT:  Normocephalic, atraumatic, External right and left ear normal. Oropharynx is clear and moist EYES: Conjunctivae and EOM are normal. Pupils are equal, round, and reactive to light. No scleral icterus.  NECK: Normal range of motion, supple, no masses SKIN: Skin is warm and dry. No rash noted. Not diaphoretic. No erythema. No pallor. NEUROLOGIC: Alert and oriented to person, place, and time. Normal reflexes, muscle tone coordination. No cranial nerve deficit noted. PSYCHIATRIC: Normal mood and affect. Normal behavior. Normal judgment and thought content. CARDIOVASCULAR: Normal heart rate noted, regular rhythm RESPIRATORY: Effort and breath sounds normal, no  problems with respiration noted ABDOMEN: Soft, nontender, nondistended. PELVIC: Deferred MUSCULOSKELETAL: Normal range of motion. No edema and no tenderness. 2+ distal pulses.    Labs: Results for orders placed or performed during the hospital encounter of 11/07/22 (from the past 336 hour(s))  CBC   Collection Time: 11/04/22  4:24 PM  Result Value Ref Range   WBC 6.5 4.0 - 10.5 K/uL   RBC 3.47 (L) 3.87 - 5.11 MIL/uL   Hemoglobin 8.0 (L) 12.0 - 15.0 g/dL   HCT 16.1 (L) 09.6 - 04.5 %   MCV 76.4 (L) 80.0 - 100.0 fL   MCH 23.1 (L) 26.0 - 34.0 pg   MCHC 30.2 30.0 - 36.0 g/dL   RDW 40.9 (H) 81.1 - 91.4 %   Platelets 470 (H) 150 - 400 K/uL   nRBC 0.0 0.0 - 0.2 %  Type and screen [facility]   Collection Time: 11/04/22  4:24 PM  Result Value Ref Range   ABO/RH(D) A POS    Antibody Screen NEG    Sample Expiration 11/18/2022,2359    Extend sample reason      NO TRANSFUSIONS OR PREGNANCY IN THE PAST 3 MONTHS Performed at [facility], [address] [place], [place] [address]   Results for orders placed or performed during the hospital encounter of 11/04/22 (from the past 336 hour(s))  Urine Drug Screen, Qualitative ([facility] only)   Collection Time: 11/04/22  4:24 PM  Result Value Ref Range   Tricyclic, Ur Screen NONE DETECTED NONE DETECTED   Amphetamines, Ur Screen POSITIVE (A) NONE DETECTED   MDMA (Ecstasy)Ur Screen NONE DETECTED NONE DETECTED   Cocaine Metabolite,Ur [place] NONE DETECTED NONE DETECTED   Opiate, Ur Screen NONE DETECTED NONE DETECTED   Phencyclidine (PCP) Ur S NONE DETECTED NONE DETECTED   Cannabinoid 50 Ng, Ur [place] NONE DETECTED NONE DETECTED   Barbiturates, Ur Screen POSITIVE (A) NONE DETECTED   Benzodiazepine, Ur Scrn POSITIVE (A) NONE DETECTED   Methadone Scn, Ur NONE DETECTED NONE DETECTED  Results for orders placed or performed during the hospital encounter of 10/24/22 (from the past 336 hour(s))  CBC   Collection Time: 10/24/22  3:39  PM  Result Value Ref Range   WBC 5.8 4.0 - 10.5 K/uL   RBC 4.01  3.87 - 5.11 MIL/uL   Hemoglobin 9.4 (L) 12.0 - 15.0 g/dL   HCT 40.9 (L) 81.1 - 91.4 %   MCV 76.8 (L) 80.0 - 100.0 fL   MCH 23.4 (L) 26.0 - 34.0 pg   MCHC 30.5 30.0 - 36.0 g/dL   RDW 78.2 (H) 95.6 - 21.3 %   Platelets 340 150 - 400 K/uL   nRBC 0.0 0.0 - 0.2 %  Comprehensive metabolic panel   Collection Time: 10/24/22  3:39 PM  Result Value Ref Range   Sodium 138 135 - 145 mmol/L   Potassium 4.1 3.5 - 5.1 mmol/L   Chloride 108 98 - 111 mmol/L   CO2 21 (L) 22 - 32 mmol/L   Glucose, Bld 107 (H) 70 - 99 mg/dL   BUN 14 6 - 20 mg/dL   Creatinine, Ser 0.86 0.44 - 1.00 mg/dL   Calcium 9.6 8.9 - 57.8 mg/dL   Total Protein 6.9 6.5 - 8.1 g/dL   Albumin 3.5 3.5 - 5.0 g/dL   AST 27 15 - 41 U/L   ALT 12 0 - 44 U/L   Alkaline Phosphatase 65 38 - 126 U/L   Total Bilirubin 1.1 0.3 - 1.2 mg/dL   GFR, Estimated >46 >96 mL/min   Anion gap 9 5 - 15    Lab Results  Component Value Date   WBC 6.5 11/04/2022   HGB 8.0 (L) 11/04/2022   HCT 26.5 (L) 11/04/2022   MCV 76.4 (L) 11/04/2022   PLT 470 (H) 11/04/2022    Lab Results  Component Value Date   CREATININE 0.68 10/24/2022   BUN 14 10/24/2022   NA 138 10/24/2022   K 4.1 10/24/2022   CL 108 10/24/2022   CO2 21 (L) 10/24/2022    Lab Results  Component Value Date   ALT 12 10/24/2022   AST 27 10/24/2022   ALKPHOS 65 10/24/2022   BILITOT 1.1 10/24/2022    Pathology:  Endometrial biopsy unable to be performed due to obstructing cervical mass   Imaging Studies: CT Abdomen Pelvis W Contrast (performed 06/22/2022) CLINICAL DATA:  Cervical mass concerning for neoplasm. Vaginal pain and bleeding  EXAM: CT ABDOMEN AND PELVIS WITH CONTRAST  TECHNIQUE: Multidetector CT imaging of the abdomen and pelvis was performed using the standard protocol following bolus administration of intravenous contrast.  RADIATION DOSE REDUCTION: This exam was performed according to  the departmental dose-optimization program which includes automated exposure control, adjustment of the mA and/or kV according to patient size and/or use of iterative reconstruction technique.  CONTRAST:  OMNIPAQUE IOHEXOL 300 MG/ML  SOLN  COMPARISON:  None Available.  FINDINGS: Lower chest: Lung bases are clear.  Hepatobiliary: No focal hepatic lesion. Postcholecystectomy. No biliary dilatation.  Pancreas: Pancreas is normal. No ductal dilatation. No pancreatic inflammation.  Spleen: Normal spleen  Adrenals/urinary tract: Adrenal glands and kidneys are normal. The ureters and bladder normal.  Stomach/Bowel: Stomach, small bowel, appendix, and cecum are normal. The colon and rectosigmoid colon are normal.  Vascular/Lymphatic: Abdominal aorta is normal caliber. No periportal or retroperitoneal adenopathy. No pelvic adenopathy.  Reproductive: The cervical mass is increased in size and now is prolapsed through the vaginal canal and external genitalia. Mass measures 7.2 x 6.4 cm in sagittal dimension (image 66/series 6/sagittal) compared to 7.4 by 4.2 cm. Lesion measures 6.4 cm in craniocaudad dimension compared to 5.8 cm. Visually the mass appears larger. Mass is peripherally enhancing with central low attenuation suggesting necrosis.  Ovaries are normal.  No  pelvic lymphadenopathy.  Other: No free fluid.  Musculoskeletal: No aggressive osseous lesion.  IMPRESSION: 1. Large uterine mass prolapse through the vaginal canal and now extending through the external genitalia vestibule. Differential remains aggressive cervical carcinoma versus prolapsed endometrial polyp. Potential interval growth concerning for aggressive neoplasm. Recommend GYN or GYN oncology evaluation during current admission. 2. No metastatic disease evident.  No pelvic lymphadenopathy  Electronically Signed   By: Genevive Bi M.D.   On: 06/22/2022 09:14    Pelvic Ultrasound  (05/13/2022) Narrative & Impression CLINICAL DATA:  Heavy menstrual periods.  History of tubal ligation.   EXAM: TRANSABDOMINAL AND TRANSVAGINAL ULTRASOUND OF PELVIS   TECHNIQUE: Both transabdominal and transvaginal ultrasound examinations of the pelvis were performed. Transabdominal technique was performed for global imaging of the pelvis including uterus, ovaries, adnexal regions, and pelvic cul-de-sac. It was necessary to proceed with endovaginal exam following the transabdominal exam to visualize the endometrium and ovaries.   COMPARISON:  None Available.   FINDINGS: Uterus   Measurements: 9.8 x 6.4 x 7.2 cm = volume: 237 mL. Heterogeneous myometrium with multiple uterine fibroids. The largest measure 4.1 x 4.9 x 3.8 cm within the posterior uterine fundus-body junction and 1.8 x 1.9 x 1.6 cm within the anterior uterine body.   Endometrium   The endometrium is difficult to visualize separate from the uterine fibroids and heterogeneous myometrium.   Right ovary   Measurements: 3.5 x 1.8 x 2.2 cm = volume: 7 mL. There is a hypoechoic well-circumscribed 1.2 x 1.5 x 1.1 cm avascular likely complex cyst with internal echoes within the right ovary.   Left ovary   Measurements: 3.8 x 2.5 x 2.6 cm = volume: 13 mL. Within the left ovary there is a moderately thick-walled, regular oval cyst with central anechoic region measuring approximately 1.4 x 1.3 x 1.3 cm.   Other findings   Trace free fluid within the pelvis.   IMPRESSION: 1. Fibroid uterus. The endometrium is difficult to visualize separate from the uterine fibroids and heterogeneous myometrium. If further evaluation is desired, consider MRI of the pelvis with contrast. 2. Complex bilateral individual ovarian cysts. Recommend follow-up ultrasound in 6-12 weeks (preferably at a different portion of the patient's menstrual cycle) to assess for resolution.     Electronically Signed   By: Neita Garnet M.D.   On:  05/13/2022 21:33     Assessment:   1. Abnormal uterine bleeding   2. Pelvic pain   3. Iron deficiency anemia, unspecified iron deficiency anemia type   4. Fibroid tumor   5. PAF (paroxysmal atrial fibrillation) (HCC)   6. Hypertension, unspecified type   7. History of stroke   8. Substance abuse    Plan:   - Patient desires definitive management with hysterectomy.  I proposed doing a laparoscopic-assisted vaginal hysterectomy (LAVH) and prophylactic bilateral salpingectomy.  No indication for oophorectomy.  Will likely have to perform vaginal myomectomy prior to hysterectomy due to large cervical mass. Patient agrees with this proposed surgery.  The risks of surgery were discussed in detail with the patient including but not limited to: bleeding which may require transfusion or reoperation; infection which may require antibiotics; injury to bowel, bladder, ureters or other surrounding organs; need for additional procedures including laparotomy or subsequent procedures secondary to abnormal pathology; formation of adhesions; thromboembolic phenomenon; incisional problems and other postoperative/anesthesia complications.  Patient was also advised that she will remain in house for 1 night; and expected recovery time after a hysterectomy is 6-8  weeks.  Patient was told that the likelihood that her condition and symptoms will be treated effectively with this surgical management was very high; the postoperative expectations were also discussed in detail. The patient also understands the alternative treatment options which were discussed in full. All questions were answered.  She was told that she will be contacted by our surgical scheduler regarding the time and date of her surgery; routine preoperative instructions will be given to her by the preoperative nursing team.  Routine postoperative instructions will be reviewed with the patient in detail after surgery.  - Preop testing reviewed.   Unable to  perform preoperative endometrial biopsy due to obstruction from cervical mass. If true concern exists, can send pathology for frozen intraoperatively.  - NPO status - Discussed patient's Cardiac and neurologic history. Has received clearance from both services. Would recommend holding anti-platelet therapy for 5-7 days prior to surgical intervention.  - Substance use noted on recent UDS. Encouraged cessation of any unprescribed medications. Nicotine patches for tobacco cessation.  - Pelvic pain, patient requests refill of pain medication until surgery.  Will refill.  - Advised on use of iron BID  for anemia.   Hildred Laser, MD [facility]

## 2022-11-08 ENCOUNTER — Encounter: Payer: Self-pay | Admitting: Obstetrics and Gynecology

## 2022-11-08 ENCOUNTER — Other Ambulatory Visit: Payer: Self-pay | Admitting: Obstetrics and Gynecology

## 2022-11-08 ENCOUNTER — Telehealth: Payer: Self-pay | Admitting: Obstetrics and Gynecology

## 2022-11-08 ENCOUNTER — Encounter: Payer: Self-pay | Admitting: Urgent Care

## 2022-11-08 ENCOUNTER — Telehealth: Payer: Self-pay

## 2022-11-08 DIAGNOSIS — Z79899 Other long term (current) drug therapy: Secondary | ICD-10-CM | POA: Diagnosis not present

## 2022-11-08 DIAGNOSIS — N939 Abnormal uterine and vaginal bleeding, unspecified: Secondary | ICD-10-CM | POA: Diagnosis not present

## 2022-11-08 DIAGNOSIS — I480 Paroxysmal atrial fibrillation: Secondary | ICD-10-CM | POA: Diagnosis not present

## 2022-11-08 DIAGNOSIS — D259 Leiomyoma of uterus, unspecified: Secondary | ICD-10-CM | POA: Diagnosis not present

## 2022-11-08 DIAGNOSIS — Z7902 Long term (current) use of antithrombotics/antiplatelets: Secondary | ICD-10-CM | POA: Diagnosis not present

## 2022-11-08 DIAGNOSIS — I10 Essential (primary) hypertension: Secondary | ICD-10-CM | POA: Diagnosis not present

## 2022-11-08 DIAGNOSIS — F17210 Nicotine dependence, cigarettes, uncomplicated: Secondary | ICD-10-CM | POA: Diagnosis not present

## 2022-11-08 DIAGNOSIS — Z8673 Personal history of transient ischemic attack (TIA), and cerebral infarction without residual deficits: Secondary | ICD-10-CM | POA: Diagnosis not present

## 2022-11-08 DIAGNOSIS — D509 Iron deficiency anemia, unspecified: Secondary | ICD-10-CM | POA: Diagnosis not present

## 2022-11-08 LAB — CBC
HCT: 25.8 % — ABNORMAL LOW (ref 36.0–46.0)
Hemoglobin: 8.2 g/dL — ABNORMAL LOW (ref 12.0–15.0)
MCH: 24.3 pg — ABNORMAL LOW (ref 26.0–34.0)
MCHC: 31.8 g/dL (ref 30.0–36.0)
MCV: 76.3 fL — ABNORMAL LOW (ref 80.0–100.0)
Platelets: 372 10*3/uL (ref 150–400)
RBC: 3.38 MIL/uL — ABNORMAL LOW (ref 3.87–5.11)
RDW: 17.1 % — ABNORMAL HIGH (ref 11.5–15.5)
WBC: 10.7 10*3/uL — ABNORMAL HIGH (ref 4.0–10.5)
nRBC: 0 % (ref 0.0–0.2)

## 2022-11-08 LAB — TYPE AND SCREEN
ABO/RH(D): A POS
Antibody Screen: NEGATIVE
Unit division: 0
Unit division: 0

## 2022-11-08 LAB — BPAM RBC
Blood Product Expiration Date: 202409042359
Blood Product Expiration Date: 202409042359
ISSUE DATE / TIME: 202408191116
ISSUE DATE / TIME: 202408191116
Unit Type and Rh: 600
Unit Type and Rh: 600

## 2022-11-08 LAB — CREATININE, SERUM
Creatinine, Ser: 0.53 mg/dL (ref 0.44–1.00)
GFR, Estimated: >60 mL/min (ref >60)

## 2022-11-08 MED ORDER — FERROUS SULFATE 325 (65 FE) MG PO TABS: 325.0000 mg | ORAL_TABLET | Freq: Two times a day (BID) | ORAL | 1 refills | Status: AC

## 2022-11-08 MED ORDER — ACETAMINOPHEN 500 MG PO TABS: 1000.0000 mg | ORAL_TABLET | Freq: Four times a day (QID) | ORAL | 1 refills | Status: AC

## 2022-11-08 MED ORDER — DOCUSATE SODIUM 100 MG PO CAPS: 100.0000 mg | ORAL_CAPSULE | Freq: Two times a day (BID) | ORAL | 2 refills | Status: AC | PRN

## 2022-11-08 MED ORDER — ACETAMINOPHEN 500 MG PO TABS: 1000.0000 mg | ORAL_TABLET | Freq: Four times a day (QID) | ORAL | 1 refills | Status: DC

## 2022-11-08 MED ORDER — OXYCODONE HCL 5 MG PO TABS: 5.0000 mg | ORAL_TABLET | Freq: Four times a day (QID) | ORAL | 0 refills | Status: DC | PRN

## 2022-11-08 MED ORDER — DOCUSATE SODIUM 100 MG PO CAPS: 100.0000 mg | ORAL_CAPSULE | Freq: Two times a day (BID) | ORAL | 2 refills | Status: DC | PRN

## 2022-11-08 MED ORDER — FERROUS SULFATE 325 (65 FE) MG PO TABS: 325.0000 mg | ORAL_TABLET | Freq: Two times a day (BID) | ORAL | 1 refills | Status: DC

## 2022-11-08 NOTE — Progress Notes (Addendum)
Post-Operative Day # 1, s/p TVH with bilateral salpingectomy. S/p blood transfusion of 1 unit PRBCs intraoperatively.   Subjective: no complaints, up ad lib, voiding, tolerating PO, and + flatus.  Denies any major bleeding or pain at this time.   Objective: Temp:  [96.9 F (36.1 C)-98.8 F (37.1 C)] 98.4 F (36.9 C) (08/20 0316) Pulse Rate:  [65-84] 80 (08/20 0316) Resp:  [10-20] 20 (08/20 0316) BP: (106-136)/(64-90) 129/90 (08/20 0316) SpO2:  [75 %-100 %] 97 % (08/20 0316)  Physical Exam:  General: alert and no distress  Lungs: clear to auscultation bilaterally Heart: regular rate and rhythm, S1, S2 normal, no murmur, click, rub or gallop Abdomen: soft, non-tender; bowel sounds normal; no masses,  no organomegaly Pelvis:Bleeding: appropriate, scant,  Incision: N/A Extremities: DVT Evaluation: No evidence of DVT seen on physical exam. Negative Homan's sign. No cords or calf tenderness. No significant calf/ankle edema.      Latest Ref Rng & Units 11/08/2022    6:33 AM 11/07/2022    1:05 PM 11/04/2022    4:24 PM  CBC  WBC 4.0 - 10.5 K/uL 10.7   6.5   Hemoglobin 12.0 - 15.0 g/dL 8.2  8.6  8.0   Hematocrit 36.0 - 46.0 % 25.8  27.9  26.5   Platelets 150 - 400 K/uL 372   470      Lab Results  Component Value Date   CREATININE 0.53 11/08/2022     Assessment/Plan: Doing well postoperatively. Continue to encourage ambulation, incentive spirometry Regular diet as tolerated Continue PO pain management To resume home meds today, including anticoagulants.  Continue PO iron for history of iron deficiency anemia due to chronic blood loss Discharge home today    Hildred Laser, MD [facility]

## 2022-11-08 NOTE — Discharge Summary (Signed)
Gynecology Physician Postoperative Discharge Summary  Patient ID: Crystal Benitez MRN: [id number] DOB/AGE: 44/02/1981 44 y.o.  Admit Date: 11/07/2022 Discharge Date: 11/08/2022  Preoperative Diagnoses: Abnormal uterine bleeding, aborting myoma, pelvic pain, iron deficiency anemia.  Also with h/o substance use disorder, h/o stroke, Afib, on long-term anticoagulant therapy.  Procedures: Procedure(s) (LRB): VAGINAL MYOMECTOMY (N/A) HYSTERECTOMY VAGINAL, Total and bilateral salpingectomy (N/A)  Hospital Course:  Crystal Benitez is a 42 y.o. [id number] admitted for scheduled surgery.  She underwent the procedures as mentioned above, her operation was uncomplicated. For further details about surgery, please refer to the operative report. Patient had an uncomplicated postoperative course. By time of discharge on POD#1, her pain was controlled on oral pain medications; she was ambulating, voiding without difficulty, tolerating regular diet and passing flatus. She was deemed stable for discharge to home.   Significant Labs:    Latest Ref Rng & Units 11/08/2022    6:33 AM 11/07/2022    1:05 PM 11/04/2022    4:24 PM  CBC  WBC 4.0 - 10.5 K/uL 10.7   6.5   Hemoglobin 12.0 - 15.0 g/dL 8.2  8.6  8.0   Hematocrit 36.0 - 46.0 % 25.8  27.9  26.5   Platelets 150 - 400 K/uL 372   470     Discharge Exam: Blood pressure (!) 129/90, pulse 80, temperature 98.4 F (36.9 C), temperature source Oral, resp. rate 20, height 5\' 5"  (1.651 m), last menstrual period 10/12/2022, SpO2 97%. General appearance: alert and no distress  Resp: clear to auscultation bilaterally  Cardio: regular rate and rhythm  GI: soft, non-tender; bowel sounds normal; no masses, no organomegaly.  Incision: C/D/I, no erythema, no drainage noted Pelvic: scant blood on pad (done in presence of RN as chaperone)  Extremities: extremities normal, atraumatic, no cyanosis or edema and Homans sign is negative, no sign of DVT  Discharged Condition:  Stable  Disposition:    Allergies as of 11/08/2022   No Known Allergies      Medication List     STOP taking these medications    oxyCODONE-acetaminophen 5-325 MG tablet Commonly known as: PERCOCET/ROXICET       TAKE these medications    acetaminophen 500 MG tablet Commonly known as: TYLENOL Take 2 tablets (1,000 mg total) by mouth every 6 (six) hours.   ALPRAZolam 0.5 MG tablet Commonly known as: XANAX Take 0.5 mg by mouth 2 (two) times daily as needed.   atorvastatin 40 MG tablet Commonly known as: LIPITOR Take 40 mg by mouth at bedtime.   butalbital-acetaminophen-caffeine 50-325-40 MG tablet Commonly known as: FIORICET Take 1 tablet at headache onset, can repeat after 4 hours. No more than 2 pills in 24 hours. Do not take more than 2-3 times a week MAXIMUM.   clopidogrel 75 MG tablet Commonly known as: PLAVIX Take 75 mg by mouth at bedtime.   divalproex 125 MG DR tablet Commonly known as: DEPAKOTE Take 1 tablet (125 mg total) by mouth every 12 (twelve) hours.   docusate sodium 100 MG capsule Commonly known as: COLACE Take 1 capsule (100 mg total) by mouth 2 (two) times daily as needed for mild constipation.   Eliquis 5 MG Tabs tablet Generic drug: apixaban Take 5 mg by mouth 2 (two) times daily.   Ferrous Gluconate 324 (37.5 Fe) MG Tabs Take 1 tablet by mouth 2 (two) times daily.   ferrous sulfate 325 (65 FE) MG tablet Take 1 tablet (325 mg total) by mouth 2 (two) times daily  with a meal. What changed: when to take this   gabapentin 100 MG capsule Commonly known as: NEURONTIN Take 200 mg by mouth 2 (two) times daily.   metoprolol succinate 25 MG 24 hr tablet Commonly known as: TOPROL-XL Take 25 mg by mouth at bedtime.   oxyCODONE 5 MG immediate release tablet Commonly known as: Oxy IR/ROXICODONE Take 1-2 tablets (5-10 mg total) by mouth every 6 (six) hours as needed for severe pain.   polyethylene glycol 17 g packet Commonly known as:  MiraLax Take 17 g by mouth 2 (two) times daily.   sertraline 100 MG tablet Commonly known as: ZOLOFT Take 100 mg by mouth at bedtime.       Future Appointments  Date Time Provider Department Center  11/17/2022  1:35 PM Hildred Laser, MD [facility] None    Follow-up Information     Hildred Laser, MD Follow up.   Specialties: Obstetrics and Gynecology, Radiology Why: 2 weeks postop visit Contact information: [address] [place] [address] [phone]                 Total discharge time: 20 minutes   Signed:  Hildred Laser, MD [facility]

## 2022-11-08 NOTE — Telephone Encounter (Signed)
[facility] is contacting our office about prescriptions sent to pharmacy today. The wanted [place] to know that "they will be refusing to fill any prescriptions for this patient and that the prescriptions sent over today have been deleted from the system". I was able to  get the name of the person calling. The caller disconnect prior to be being able to get her name.

## 2022-11-08 NOTE — Telephone Encounter (Signed)
Hey I got a message from Maralyn Sago first thing this morning wanting me to change the pharmacy on this patient. So I changed it from [facility] to [facility]. Well hours later [facility] called with concerns about patient switching pharmacy with controlled substances. So they told us that they were deleting the prescriptions and that she needs to go somewhere else. Now she has no medication. Not sure why she would try and switch things up in the process of medications being filled that she really needs. I told her to call the hospital and ask them to send them over for her. She is not happy. Please advise.

## 2022-11-08 NOTE — Telephone Encounter (Signed)
The patient is calling to update pharmacy on file for future prescription to be [facility] [address]. Could we please update her pharmacy on file? Please advise?

## 2022-11-08 NOTE — Progress Notes (Signed)
Patient discharged. Discharge instructions given. Patient verbalizes understanding. Transported by axillary. 

## 2022-11-09 NOTE — Anesthesia Postprocedure Evaluation (Signed)
Anesthesia Post Note  Patient: [name]  Procedure(s) Performed: VAGINAL MYOMECTOMY HYSTERECTOMY VAGINAL, Total and bilateral salpingectomy (Abdomen)  Patient location during evaluation: PACU Anesthesia Type: General Level of consciousness: awake and alert Pain management: pain level controlled Vital Signs Assessment: post-procedure vital signs reviewed and stable Respiratory status: spontaneous breathing, nonlabored ventilation, respiratory function stable and patient connected to nasal cannula oxygen Cardiovascular status: blood pressure returned to baseline and stable Postop Assessment: no apparent nausea or vomiting Anesthetic complications: no   No notable events documented.   Last Vitals:  Vitals:   11/08/22 0316 11/08/22 0930  BP: (!) 129/90 104/62  Pulse: 80   Resp: 20 19  Temp: 36.9 C 36.8 C  SpO2: 97% 100%    Last Pain:  Vitals:   11/08/22 0930  TempSrc: Oral  PainSc: 7                  Yevette Edwards

## 2022-11-14 ENCOUNTER — Telehealth: Payer: Self-pay

## 2022-11-14 NOTE — Telephone Encounter (Signed)
Patient calling with concerns about pain with urination after hysterectomy. She reports that she has been having pain since she had her surgery. She denies frequency and urgency. I asked her to come in and leave a urine sample that she might have a UTI, she wanted me to reach out to you first about concerns. Please advise.

## 2022-11-17 ENCOUNTER — Encounter: Payer: Medicare HMO | Admitting: Obstetrics and Gynecology

## 2022-11-17 ENCOUNTER — Telehealth: Payer: Self-pay | Admitting: Obstetrics and Gynecology

## 2022-11-17 NOTE — Telephone Encounter (Addendum)
The patient contacted the office via phone. Her appointment was schedule for post op with Dr. Valentino Saxon at 1:35 pm. The patient didn't come to scheduled time. She asked if she could come on now to be seen. Per Dr. Valentino Saxon to offer 3:55 pm work in spot. The patient declined. The patient requested to rescheduled to the next Friday appointment. I explained to the patient "She has been recommend to follow up care for her post op today and the next opening available would be 2 week for Friday appointment. Friday, 9/13 at 10:55 am which would be too far out. The patient states "that is fine".I  spoke face to face with Dr. Valentino Saxon updating her that the patient requested rescheduling and how far out. Dr. Valentino Saxon advised "Me to call the patient and ask her to come on in. She would see her!". I attempt to contacted the patient at [phone] phone number she called from x2. No one answered. I attempt to call the patient by phone number on file [phone] no answer, x2 no voicemail, and "Call could not be completed as dialed".

## 2022-11-17 NOTE — Telephone Encounter (Signed)
Ok, thank you

## 2022-11-17 NOTE — Telephone Encounter (Signed)
I attempt to call patient again to see if I could reach her. No answer, Call couldn't be completed as Dialed".

## 2022-11-17 NOTE — Progress Notes (Deleted)
    OBSTETRICS/GYNECOLOGY POST-OPERATIVE CLINIC VISIT  Subjective:     Crystal Benitez is a 42 y.o. female who presents to the clinic {1-10:13787} weeks status post VAGINAL MYOMECTOMY HYSTERECTOMY VAGINAL, Total and bilateral salpingectomy  for Abnormal uterine bleeding, anemia, Pelvic pain, aborting myoma . Eating a regular diet {with-without:5700} difficulty. Bowel movements are {normal/abnormal***:19619}. {pain control:13522::"The patient is not having any pain."}  {Common ambulatory SmartLinks:19316}  Review of Systems {ros; complete:30496}   Objective:   LMP 10/24/2022 Comment: Constant bleeding at the present time There is no height or weight on file to calculate BMI.  General:  alert and no distress  Abdomen: soft, bowel sounds active, non-tender  Incision:   {incision:13716::"no dehiscence","incision well approximated","healing well","no drainage","no erythema","no hernia","no seroma","no swelling"}    Pathology:    Assessment:   Patient s/p VAGINAL MYOMECTOMY HYSTERECTOMY VAGINAL, Total and bilateral salpingectomy  (surgery)  {doing well:13525::"Doing well postoperatively."}   Plan:   1. Continue any current medications as instructed by provider. 2. Wound care discussed. 3. Operative findings again reviewed. Pathology report discussed. 4. Activity restrictions: {restrictions:13723} 5. Anticipated return to work: {work return:14002}. 6. Follow up: {7-82:95621} {time; units:18646} for ***    Hildred Laser, MD [facility]

## 2022-11-28 DIAGNOSIS — I639 Cerebral infarction, unspecified: Secondary | ICD-10-CM | POA: Diagnosis not present

## 2022-11-28 DIAGNOSIS — Z8673 Personal history of transient ischemic attack (TIA), and cerebral infarction without residual deficits: Secondary | ICD-10-CM | POA: Diagnosis not present

## 2022-11-28 DIAGNOSIS — E782 Mixed hyperlipidemia: Secondary | ICD-10-CM | POA: Diagnosis not present

## 2022-11-28 DIAGNOSIS — F17200 Nicotine dependence, unspecified, uncomplicated: Secondary | ICD-10-CM | POA: Diagnosis not present

## 2022-11-28 DIAGNOSIS — F4325 Adjustment disorder with mixed disturbance of emotions and conduct: Secondary | ICD-10-CM | POA: Diagnosis not present

## 2022-11-28 DIAGNOSIS — I480 Paroxysmal atrial fibrillation: Secondary | ICD-10-CM | POA: Diagnosis not present

## 2022-12-02 ENCOUNTER — Encounter: Payer: Self-pay | Admitting: Obstetrics and Gynecology

## 2022-12-02 ENCOUNTER — Ambulatory Visit (INDEPENDENT_AMBULATORY_CARE_PROVIDER_SITE_OTHER): Payer: Medicare HMO | Admitting: Obstetrics and Gynecology

## 2022-12-02 VITALS — BP 110/66 | HR 84 | Resp 16 | Ht 65.0 in | Wt 148.4 lb

## 2022-12-02 DIAGNOSIS — F1910 Other psychoactive substance abuse, uncomplicated: Secondary | ICD-10-CM

## 2022-12-02 DIAGNOSIS — Z9289 Personal history of other medical treatment: Secondary | ICD-10-CM

## 2022-12-02 DIAGNOSIS — N840 Polyp of corpus uteri: Secondary | ICD-10-CM

## 2022-12-02 DIAGNOSIS — Z4889 Encounter for other specified surgical aftercare: Secondary | ICD-10-CM

## 2022-12-02 DIAGNOSIS — Z90710 Acquired absence of both cervix and uterus: Secondary | ICD-10-CM

## 2022-12-02 DIAGNOSIS — Z862 Personal history of diseases of the blood and blood-forming organs and certain disorders involving the immune mechanism: Secondary | ICD-10-CM

## 2022-12-02 MED ORDER — TRAMADOL HCL 50 MG PO TABS: 50.0000 mg | ORAL_TABLET | Freq: Four times a day (QID) | ORAL | 1 refills | Status: DC | PRN

## 2022-12-02 NOTE — Progress Notes (Signed)
    OBSTETRICS/GYNECOLOGY POST-OPERATIVE CLINIC VISIT  Subjective:    Crystal Benitez is a 42 y.o. female who presents to the clinic 3.5 weeks status post  VAGINAL MASS REMOVAL WITH HYSTERECTOMY WITH TOTAL BILATERAL SALPINGECTOMY for Abnormal uterine bleeding, anemia, Pelvic pain, suspected aborting myoma.  Surgery was complicated by excessive blood loss requiring blood transfusion.  Is taking iron as prescribed.  Eating a regular diet without difficulty. Bowel movements are normal. Pain is partially controlled with current analgesics. Medications being used: acetaminophen and prescription NSAID's including ibuprofen (Motrin).  Notes that she still feels pelvic pressure.  Wonders if she can have another refill on narcotic pain medication.  The following portions of the patient's history were reviewed and updated as appropriate: allergies, current medications, past family history, past medical history, past social history, past surgical history, and problem list.  Review of Systems Pertinent items are noted in HPI.   Objective:   Resp 16   Ht 5\' 5"  (1.651 m)   Wt 148 lb 6.4 oz (67.3 kg)   BMI 24.70 kg/m  Body mass index is 24.7 kg/m.  General:  alert and no distress  Abdomen: soft, bowel sounds active, non-tender  Pelvis:  Deferred    Pathology:  Diagnosis 1. Soft tissue mass, biopsy, Cervical - BENIGN ENDOMETRIAL POLYP WITH FOCAL ISCHEMIC CHANGES - NEGATIVE FOR HYPERPLASIA OR MALIGNANCY 2. Uterus, cervix and bilateral fallopian tubes - BENIGN CERVICAL, SQUAMOUS MUCOSA; NEGATIVE FOR DYSPLASIA - BENIGN ENDOMETRIUM; NEGATIVE FOR HYPERPLASIA - BENIGN MYOMETRIUM - BILATERAL, BENIGN FIMBRIATED FALLOPIAN TUBES, STATUS POST TUBAL LIGATION - NEGATIVE FOR MALIGNANCY  Assessment:   Postoperative visit History of substance abuse History of anemia History of blood transfusion operatively   Plan:   1. Continue any current medications as instructed by provider.  Patient requesting  prescription for narcotics.  I discussed that I would not provide her with any more strong pain medications such as Percocet however if she truly was having episodes of pain I will give her a short supply of tramadol.  Advised to continue alternating use of Tylenol and Motrin . 2. Wound care discussed. 3. Operative findings again reviewed. Pathology report discussed. 4. Activity restrictions: no bending, stooping, or squatting, no lifting more than 10-15 pounds, and pelvic rest 5. Anticipated return to work:  Not applicable . 6. Follow up: 3 weeks for final postop check.  Will need to repeat H&H at that time.    Hildred Laser, MD [facility]

## 2023-02-28 NOTE — Progress Notes (Unsigned)
    OBSTETRICS/GYNECOLOGY POST-OPERATIVE CLINIC VISIT  Subjective:     Crystal Benitez is a 42 y.o. female who presents to the clinic status post VAGINAL MYOMECTOMY HYSTERECTOMY VAGINAL, Total and bilateral salpingectomy  for Abnormal uterine bleeding, anemia, Pelvic pain, aborting myoma . Eating a regular diet {with-without:5700} difficulty. Bowel movements are {normal/abnormal***:19619}. {pain control:13522::"The patient is not having any pain."}  {Common ambulatory SmartLinks:19316}  Review of Systems {ros; complete:30496}   Objective:   There were no vitals taken for this visit. There is no height or weight on file to calculate BMI.  General:  alert and no distress  Abdomen: soft, bowel sounds active, non-tender  Incision:   {incision:13716::"no dehiscence","incision well approximated","healing well","no drainage","no erythema","no hernia","no seroma","no swelling"}    Pathology:    Assessment:   Patient s/p VAGINAL MYOMECTOMY HYSTERECTOMY VAGINAL, Total and bilateral salpingectomy (surgery)  {doing well:13525::"Doing well postoperatively."}   Plan:   1. Continue any current medications as instructed by provider. 2. Wound care discussed. 3. Operative findings again reviewed. Pathology report discussed. 4. Activity restrictions: {restrictions:13723} 5. Anticipated return to work: {work return:14002}. 6. Follow up: {1-61:09604} {time; units:18646} for ***    Hildred Laser, MD [facility]

## 2023-03-01 ENCOUNTER — Encounter: Payer: Medicare HMO | Admitting: Obstetrics and Gynecology

## 2023-03-01 ENCOUNTER — Telehealth: Payer: Self-pay | Admitting: Obstetrics and Gynecology

## 2023-03-01 DIAGNOSIS — D509 Iron deficiency anemia, unspecified: Secondary | ICD-10-CM

## 2023-03-01 DIAGNOSIS — Z4889 Encounter for other specified surgical aftercare: Secondary | ICD-10-CM

## 2023-03-01 NOTE — Telephone Encounter (Signed)
Reached out to pt to reschedule post op appt  that was scheduled on 03/01/2023 with Dr. Valentino Saxon at 10:55.  Could not leave a message bc number was not in service.

## 2023-03-02 ENCOUNTER — Encounter: Payer: Self-pay | Admitting: Obstetrics and Gynecology

## 2023-03-02 NOTE — Telephone Encounter (Signed)
Reached out to pt (2x) to reschedule post op appt that was scheduled on 03/01/2023 with Dr. Valentino Saxon at 10:55.  Could not leave a message bc number was not in service. Will send a MyChart letter.

## 2023-06-30 DIAGNOSIS — E6609 Other obesity due to excess calories: Secondary | ICD-10-CM | POA: Diagnosis not present

## 2023-06-30 DIAGNOSIS — F4325 Adjustment disorder with mixed disturbance of emotions and conduct: Secondary | ICD-10-CM | POA: Diagnosis not present

## 2023-06-30 DIAGNOSIS — E782 Mixed hyperlipidemia: Secondary | ICD-10-CM | POA: Diagnosis not present

## 2023-06-30 DIAGNOSIS — Z9181 History of falling: Secondary | ICD-10-CM | POA: Diagnosis not present

## 2023-06-30 DIAGNOSIS — Z720 Tobacco use: Secondary | ICD-10-CM | POA: Diagnosis not present

## 2023-06-30 DIAGNOSIS — F17200 Nicotine dependence, unspecified, uncomplicated: Secondary | ICD-10-CM | POA: Diagnosis not present

## 2023-06-30 DIAGNOSIS — I480 Paroxysmal atrial fibrillation: Secondary | ICD-10-CM | POA: Diagnosis not present

## 2023-06-30 DIAGNOSIS — Z8673 Personal history of transient ischemic attack (TIA), and cerebral infarction without residual deficits: Secondary | ICD-10-CM | POA: Diagnosis not present

## 2023-06-30 DIAGNOSIS — I639 Cerebral infarction, unspecified: Secondary | ICD-10-CM | POA: Diagnosis not present

## 2023-09-28 ENCOUNTER — Emergency Department

## 2023-09-28 ENCOUNTER — Other Ambulatory Visit: Payer: Self-pay

## 2023-09-28 ENCOUNTER — Emergency Department
Admission: EM | Admit: 2023-09-28 | Discharge: 2023-09-28 | Disposition: A | Discharge status: Home / Self Care | Attending: Emergency Medicine | Admitting: Emergency Medicine

## 2023-09-28 DIAGNOSIS — M545 Low back pain, unspecified: Secondary | ICD-10-CM | POA: Diagnosis not present

## 2023-09-28 DIAGNOSIS — T148XXA Other injury of unspecified body region, initial encounter: Secondary | ICD-10-CM | POA: Insufficient documentation

## 2023-09-28 DIAGNOSIS — Y93E1 Activity, personal bathing and showering: Secondary | ICD-10-CM | POA: Diagnosis not present

## 2023-09-28 DIAGNOSIS — M4802 Spinal stenosis, cervical region: Secondary | ICD-10-CM | POA: Diagnosis not present

## 2023-09-28 DIAGNOSIS — Z7901 Long term (current) use of anticoagulants: Secondary | ICD-10-CM | POA: Insufficient documentation

## 2023-09-28 DIAGNOSIS — S0990XA Unspecified injury of head, initial encounter: Secondary | ICD-10-CM | POA: Diagnosis not present

## 2023-09-28 DIAGNOSIS — Z9181 History of falling: Secondary | ICD-10-CM | POA: Diagnosis not present

## 2023-09-28 DIAGNOSIS — M5020 Other cervical disc displacement, unspecified cervical region: Secondary | ICD-10-CM | POA: Diagnosis not present

## 2023-09-28 DIAGNOSIS — S71112A Laceration without foreign body, left thigh, initial encounter: Secondary | ICD-10-CM | POA: Diagnosis not present

## 2023-09-28 DIAGNOSIS — Z8673 Personal history of transient ischemic attack (TIA), and cerebral infarction without residual deficits: Secondary | ICD-10-CM | POA: Insufficient documentation

## 2023-09-28 DIAGNOSIS — S71111A Laceration without foreign body, right thigh, initial encounter: Secondary | ICD-10-CM | POA: Insufficient documentation

## 2023-09-28 DIAGNOSIS — W182XXA Fall in (into) shower or empty bathtub, initial encounter: Secondary | ICD-10-CM | POA: Insufficient documentation

## 2023-09-28 DIAGNOSIS — W1839XA Other fall on same level, initial encounter: Secondary | ICD-10-CM | POA: Insufficient documentation

## 2023-09-28 DIAGNOSIS — W540XXA Bitten by dog, initial encounter: Secondary | ICD-10-CM | POA: Insufficient documentation

## 2023-09-28 DIAGNOSIS — Z23 Encounter for immunization: Secondary | ICD-10-CM | POA: Diagnosis not present

## 2023-09-28 DIAGNOSIS — M47812 Spondylosis without myelopathy or radiculopathy, cervical region: Secondary | ICD-10-CM | POA: Diagnosis not present

## 2023-09-28 DIAGNOSIS — Y92009 Unspecified place in unspecified non-institutional (private) residence as the place of occurrence of the external cause: Secondary | ICD-10-CM | POA: Insufficient documentation

## 2023-09-28 DIAGNOSIS — M4312 Spondylolisthesis, cervical region: Secondary | ICD-10-CM | POA: Diagnosis not present

## 2023-09-28 DIAGNOSIS — S79922A Unspecified injury of left thigh, initial encounter: Secondary | ICD-10-CM | POA: Diagnosis present

## 2023-09-28 MED ORDER — AMOXICILLIN-POT CLAVULANATE 875-125 MG PO TABS: 1.0000 | ORAL_TABLET | Freq: Two times a day (BID) | ORAL | 0 refills | Status: AC

## 2023-09-28 MED ORDER — TETANUS-DIPHTH-ACELL PERTUSSIS 5-2.5-18.5 LF-MCG/0.5 IM SUSY
0.5000 mL | PREFILLED_SYRINGE | Freq: Once | INTRAMUSCULAR | Status: AC
  Administered 2023-09-28: 0.5 mL via INTRAMUSCULAR
  Filled 2023-09-28: qty 0.5

## 2023-09-28 MED ORDER — OXYCODONE HCL 5 MG PO TABS
5.0000 mg | ORAL_TABLET | Freq: Once | ORAL | Status: AC
  Administered 2023-09-28: 5 mg via ORAL
  Filled 2023-09-28: qty 1

## 2023-09-28 MED ORDER — HYDROCODONE-ACETAMINOPHEN 5-325 MG PO TABS
1.0000 | ORAL_TABLET | Freq: Four times a day (QID) | ORAL | 0 refills | Status: AC | PRN
Start: 2023-09-28 — End: 2024-09-27

## 2023-09-28 NOTE — ED Provider Notes (Signed)
 [facility] Provider Note    Event Date/Time   First MD Initiated Contact with Patient 09/28/23 [phone]     (approximate)   History   Fall and Animal Bite   HPI  Kelsay Haggard is a 43 y.o. female presents to the ED with 2 complaints.  Patient states that she was bitten by 5 dogs 2 days ago.  She states that she fell on the sidewalk prior to being bitten.  She denies any head injury or loss of consciousness.  She also is here with complaint of low back pain after falling in the shower this morning.  Patient again denies hitting her head or any loss of consciousness with the fall that occurred this morning.     Physical Exam   Triage Vital Signs: ED Triage Vitals  Encounter Vitals Group     BP 09/28/23 0830 123/71     Girls Systolic BP Percentile --      Girls Diastolic BP Percentile --      Boys Systolic BP Percentile --      Boys Diastolic BP Percentile --      Pulse Rate 09/28/23 0830 (!) 104     Resp 09/28/23 0830 18     Temp 09/28/23 0830 98.5 F (36.9 C)     Temp Source 09/28/23 0830 Oral     SpO2 09/28/23 0830 98 %     Weight 09/28/23 0834 160 lb (72.6 kg)     Height 09/28/23 0834 5' 5 (1.651 m)     Head Circumference --      Peak Flow --      Pain Score 09/28/23 0831 9     Pain Loc --      Pain Education --      Exclude from Growth Chart --     Most recent vital signs: Vitals:   09/28/23 0830 09/28/23 1136  BP: 123/71 120/70  Pulse: (!) 104 88  Resp: 18 18  Temp: 98.5 F (36.9 C)   SpO2: 98% 98%     General: Awake, no distress.  Alert, talkative. CV:  Good peripheral perfusion.  Heart regular rate and rhythm. Resp:  Normal effort.  Lungs clear bilaterally. Abd:  No distention.  Soft, nontender. Other:  PERRLA, EOMI's, cranial nerves II through XII grossly intact, speech is normal, no facial trauma present, nontender cervical spine.  Bilateral posterior thighs, patient has 2 individual superficial lacerations from a dog bite.   Patient has multiple bruises along with some superficial scratches.  These appear to be healing without any signs of infection.  No point tenderness on palpation of cervical or thoracic spine.  There is some diffuse tenderness on palpation of the lower lumbar spine.  No discoloration, bruising, soft tissue edema present.  Patient is able to move lower extremities without any difficulty, good muscle strength bilaterally.  Patient is able to stand and ambulate.   ED Results / Procedures / Treatments   Labs (all labs ordered are listed, but only abnormal results are displayed) Labs Reviewed - No data to display   RADIOLOGY CT head and cervical spine per radiology is negative for acute intracranial changes and no cervical fracture or subluxation noted.  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  Lumbar spine x-ray images were reviewed and interpreted by myself  independent of the radiologist and was negative for fracture.  Official radiologist report for lumbar spine is negative for acute injury.   PROCEDURES:  Critical Care performed:   Procedures   MEDICATIONS ORDERED IN ED: Medications  oxyCODONE  (Oxy IR/ROXICODONE ) immediate release tablet 5 mg (5 mg Oral Given 09/28/23 1004)  Tdap (BOOSTRIX ) injection 0.5 mL (0.5 mLs Intramuscular Given 09/28/23 1107)     IMPRESSION / MDM / ASSESSMENT AND PLAN / ED COURSE  I reviewed the triage vital signs and the nursing notes.   Differential diagnosis includes, but is not limited to, multiple dog bites, contusion, sprain, 2 falls within 48 hours, head injury considered due to fall, lumbar fracture, contusion, sprain.  43 year old female presents to the ED with history of 2 falls in the last 48 hours.  1 was approximately 2 days ago which time she  states that she was bitten by 5 dogs.  Animal control was notified and did call back stating that the dogs that were involved are all immunized.  Patient was made aware that she does not have to get rabies vaccine.  She does however need a tetanus immunization which was done while in the ED.  A prescription for Augmentin  875 twice daily was sent to the pharmacy along with hydrocodone .  Patient was made aware that she needs to keep the dog bites clean and dry and watch for any signs of infection.  She is return to the emergency department or urgent care if any symptoms or urgent concerns of her dog bites getting infected.  She was also encouraged to follow-up with her PCP if any continued problems.      Patient's presentation is most consistent with acute complicated illness / injury requiring diagnostic workup.  FINAL CLINICAL IMPRESSION(S) / ED DIAGNOSES   Final diagnoses:  Dog bite of multiple sites  Fall in home, initial encounter  Acute bilateral low back pain without sciatica     Rx / DC Orders   ED Discharge Orders          Ordered    amoxicillin -clavulanate (AUGMENTIN ) 875-125 MG tablet  2 times daily        09/28/23 1125    HYDROcodone -acetaminophen  (NORCO/VICODIN) 5-325 MG tablet  Every 6 hours PRN        09/28/23 1125             Note:  This document was prepared using Dragon voice recognition software and may include unintentional dictation errors.   Saunders Shona CROME, PA-C 09/28/23 1413    Suzanne Kirsch, MD 09/28/23 1534

## 2023-09-28 NOTE — ED Notes (Signed)
 See triage note  Presents with dog bites which are 67 days old   States she was attacked by several dogs on Tuesday and fell on to the sidewalk Then fell again in shower  Having pain to lower back and legs

## 2023-09-28 NOTE — ED Triage Notes (Signed)
 Pt to ED for c/o of fall on sidewalk and multiple dog bites two days ago. Pt states fall in shower last night. Pt denies hitting head, endorses taking blood thinners.

## 2023-09-28 NOTE — Discharge Instructions (Signed)
 Follow-up with your primary care provider if any continued problems or concerns.  Prescriptions were sent to the pharmacy for Augmentin  875 twice daily for 10 days.  Also prescription for hydrocodone  was sent to the pharmacy as needed for pain.  Clean the dog bites twice a day with mild soap and water and watch for any signs of infection.  Return to the emergency department if any severe worsening of your symptoms or urgent concerns if there is any drainage or redness at these areas.  You may use ice or heat to your back as needed for discomfort.  Return to the emergency department if any urgent concerns or worsening of your dog bites.
